# Patient Record
Sex: Male | Born: 1950 | Race: Black or African American | Hispanic: No | Marital: Married | Smoking: Never smoker
Health system: Southern US, Community
[De-identification: ages and names within clinical notes are randomized; demographics above are authoritative.]

## PROBLEM LIST (undated history)

## (undated) DIAGNOSIS — C61 Malignant neoplasm of prostate: Secondary | ICD-10-CM

## (undated) DIAGNOSIS — K922 Gastrointestinal hemorrhage, unspecified: Secondary | ICD-10-CM

## (undated) DIAGNOSIS — R569 Unspecified convulsions: Secondary | ICD-10-CM

## (undated) DIAGNOSIS — Z9989 Dependence on other enabling machines and devices: Secondary | ICD-10-CM

## (undated) DIAGNOSIS — G4733 Obstructive sleep apnea (adult) (pediatric): Secondary | ICD-10-CM

## (undated) DIAGNOSIS — Z87898 Personal history of other specified conditions: Secondary | ICD-10-CM

## (undated) DIAGNOSIS — Z973 Presence of spectacles and contact lenses: Secondary | ICD-10-CM

## (undated) DIAGNOSIS — E7800 Pure hypercholesterolemia, unspecified: Secondary | ICD-10-CM

## (undated) DIAGNOSIS — E669 Obesity, unspecified: Secondary | ICD-10-CM

## (undated) DIAGNOSIS — I10 Essential (primary) hypertension: Secondary | ICD-10-CM

## (undated) DIAGNOSIS — I2510 Atherosclerotic heart disease of native coronary artery without angina pectoris: Secondary | ICD-10-CM

## (undated) DIAGNOSIS — J189 Pneumonia, unspecified organism: Secondary | ICD-10-CM

## (undated) DIAGNOSIS — R0600 Dyspnea, unspecified: Secondary | ICD-10-CM

## (undated) DIAGNOSIS — R911 Solitary pulmonary nodule: Secondary | ICD-10-CM

## (undated) DIAGNOSIS — Z955 Presence of coronary angioplasty implant and graft: Secondary | ICD-10-CM

## (undated) DIAGNOSIS — K635 Polyp of colon: Secondary | ICD-10-CM

## (undated) DIAGNOSIS — G4730 Sleep apnea, unspecified: Secondary | ICD-10-CM

## (undated) DIAGNOSIS — N401 Enlarged prostate with lower urinary tract symptoms: Secondary | ICD-10-CM

## (undated) DIAGNOSIS — Z87820 Personal history of traumatic brain injury: Secondary | ICD-10-CM

## (undated) HISTORY — PX: COLONOSCOPY: SHX174

## (undated) HISTORY — DX: Sleep apnea, unspecified: G47.30

## (undated) HISTORY — DX: Gastrointestinal hemorrhage, unspecified: K92.2

## (undated) HISTORY — PX: TRANSTHORACIC ECHOCARDIOGRAM: SHX275

## (undated) HISTORY — PX: CARDIOVASCULAR STRESS TEST: SHX262

## (undated) HISTORY — DX: Atherosclerotic heart disease of native coronary artery without angina pectoris: I25.10

## (undated) HISTORY — DX: Unspecified convulsions: R56.9

## (undated) HISTORY — DX: Polyp of colon: K63.5

## (undated) HISTORY — DX: Essential (primary) hypertension: I10

## (undated) HISTORY — PX: CORONARY ANGIOPLASTY WITH STENT PLACEMENT: SHX49

## (undated) HISTORY — DX: Pure hypercholesterolemia, unspecified: E78.00

## (undated) HISTORY — DX: Obesity, unspecified: E66.9

---
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
## 1997-06-26 ENCOUNTER — Emergency Department (HOSPITAL_COMMUNITY): Admission: EM | Admit: 1997-06-26 | Discharge: 1997-06-26 | Payer: Self-pay | Admitting: Emergency Medicine

## 1997-07-01 ENCOUNTER — Emergency Department (HOSPITAL_COMMUNITY): Admission: EM | Admit: 1997-07-01 | Discharge: 1997-07-01 | Payer: Self-pay | Admitting: Emergency Medicine

## 2003-11-26 ENCOUNTER — Ambulatory Visit: Payer: Self-pay | Admitting: Internal Medicine

## 2003-12-04 ENCOUNTER — Ambulatory Visit: Payer: Self-pay | Admitting: Internal Medicine

## 2003-12-11 ENCOUNTER — Ambulatory Visit: Payer: Self-pay

## 2005-06-10 ENCOUNTER — Ambulatory Visit: Payer: Self-pay | Admitting: Internal Medicine

## 2005-09-20 ENCOUNTER — Ambulatory Visit: Payer: Self-pay | Admitting: Internal Medicine

## 2006-05-25 ENCOUNTER — Emergency Department (HOSPITAL_COMMUNITY): Admission: EM | Admit: 2006-05-25 | Discharge: 2006-05-25 | Payer: Self-pay | Admitting: Emergency Medicine

## 2006-05-26 ENCOUNTER — Emergency Department (HOSPITAL_COMMUNITY): Admission: EM | Admit: 2006-05-26 | Discharge: 2006-05-26 | Payer: Self-pay | Admitting: Family Medicine

## 2006-11-02 ENCOUNTER — Encounter: Payer: Self-pay | Admitting: Internal Medicine

## 2006-12-27 ENCOUNTER — Encounter: Payer: Self-pay | Admitting: Internal Medicine

## 2007-05-10 ENCOUNTER — Ambulatory Visit: Payer: Self-pay | Admitting: Internal Medicine

## 2007-05-10 DIAGNOSIS — N400 Enlarged prostate without lower urinary tract symptoms: Secondary | ICD-10-CM | POA: Insufficient documentation

## 2007-05-10 DIAGNOSIS — I10 Essential (primary) hypertension: Secondary | ICD-10-CM | POA: Insufficient documentation

## 2007-05-10 DIAGNOSIS — R569 Unspecified convulsions: Secondary | ICD-10-CM | POA: Insufficient documentation

## 2007-05-10 DIAGNOSIS — E785 Hyperlipidemia, unspecified: Secondary | ICD-10-CM | POA: Insufficient documentation

## 2007-05-10 DIAGNOSIS — R3915 Urgency of urination: Secondary | ICD-10-CM | POA: Insufficient documentation

## 2007-05-10 LAB — CONVERTED CEMR LAB
Bilirubin Urine: NEGATIVE
Blood in Urine, dipstick: NEGATIVE
Glucose, Urine, Semiquant: NEGATIVE
Ketones, urine, test strip: NEGATIVE
Nitrite: NEGATIVE
Protein, U semiquant: NEGATIVE
Specific Gravity, Urine: <1.005
Urobilinogen, UA: NEGATIVE
WBC Urine, dipstick: NEGATIVE
pH: 6.5

## 2007-08-21 ENCOUNTER — Ambulatory Visit: Payer: Self-pay | Admitting: Internal Medicine

## 2007-08-21 LAB — CONVERTED CEMR LAB
ALT: 30 units/L (ref 0–53)
AST: 28 units/L (ref 0–37)
Albumin: 4.1 g/dL (ref 3.5–5.2)
Alkaline Phosphatase: 53 units/L (ref 39–117)
BUN: 13 mg/dL (ref 6–23)
Basophils Absolute: 0 10*3/uL (ref 0.0–0.1)
Basophils Relative: 0.4 % (ref 0.0–3.0)
Bilirubin Urine: NEGATIVE
Bilirubin, Direct: 0.1 mg/dL (ref 0.0–0.3)
Blood in Urine, dipstick: NEGATIVE
CO2: 28 meq/L (ref 19–32)
Calcium: 9.3 mg/dL (ref 8.4–10.5)
Chloride: 102 meq/L (ref 96–112)
Cholesterol: 222 mg/dL (ref 0–200)
Creatinine, Ser: 1.2 mg/dL (ref 0.4–1.5)
Direct LDL: 179.1 mg/dL
Eosinophils Absolute: 0.1 10*3/uL (ref 0.0–0.7)
Eosinophils Relative: 1.4 % (ref 0.0–5.0)
GFR calc Af Amer: 81 mL/min
GFR calc non Af Amer: 67 mL/min
Glucose, Bld: 106 mg/dL — ABNORMAL HIGH (ref 70–99)
Glucose, Urine, Semiquant: NEGATIVE
HCT: 46.4 % (ref 39.0–52.0)
HDL: 36.1 mg/dL — ABNORMAL LOW (ref >39.0)
Hemoglobin: 15.7 g/dL (ref 13.0–17.0)
Ketones, urine, test strip: NEGATIVE
Lymphocytes Relative: 30.6 % (ref 12.0–46.0)
MCHC: 33.7 g/dL (ref 30.0–36.0)
MCV: 88.6 fL (ref 78.0–100.0)
Monocytes Absolute: 0.6 10*3/uL (ref 0.1–1.0)
Monocytes Relative: 10.6 % (ref 3.0–12.0)
Neutro Abs: 3 10*3/uL (ref 1.4–7.7)
Neutrophils Relative %: 57 % (ref 43.0–77.0)
Nitrite: NEGATIVE
PSA: 2.81 ng/mL (ref 0.10–4.00)
Platelets: 228 10*3/uL (ref 150–400)
Potassium: 3.8 meq/L (ref 3.5–5.1)
RBC: 5.24 M/uL (ref 4.22–5.81)
RDW: 13.5 % (ref 11.5–14.6)
Sodium: 139 meq/L (ref 135–145)
Specific Gravity, Urine: 1.025
TSH: 0.52 microintl units/mL (ref 0.35–5.50)
Total Bilirubin: 0.9 mg/dL (ref 0.3–1.2)
Total CHOL/HDL Ratio: 6.1
Total Protein: 7.9 g/dL (ref 6.0–8.3)
Triglycerides: 94 mg/dL (ref 0–149)
Urobilinogen, UA: 0.2
VLDL: 19 mg/dL (ref 0–40)
WBC Urine, dipstick: NEGATIVE
WBC: 5.3 10*3/uL (ref 4.5–10.5)
pH: 6

## 2007-08-22 LAB — CONVERTED CEMR LAB

## 2007-08-28 ENCOUNTER — Ambulatory Visit: Payer: Self-pay | Admitting: Internal Medicine

## 2007-09-11 ENCOUNTER — Ambulatory Visit: Payer: Self-pay | Admitting: Internal Medicine

## 2007-09-11 DIAGNOSIS — L678 Other hair color and hair shaft abnormalities: Secondary | ICD-10-CM | POA: Insufficient documentation

## 2007-09-11 DIAGNOSIS — L738 Other specified follicular disorders: Secondary | ICD-10-CM

## 2007-11-16 ENCOUNTER — Telehealth: Payer: Self-pay | Admitting: Internal Medicine

## 2008-02-14 ENCOUNTER — Ambulatory Visit: Payer: Self-pay | Admitting: Internal Medicine

## 2008-06-13 ENCOUNTER — Ambulatory Visit: Payer: Self-pay | Admitting: Internal Medicine

## 2008-10-07 ENCOUNTER — Ambulatory Visit: Payer: Self-pay | Admitting: Internal Medicine

## 2008-10-07 LAB — CONVERTED CEMR LAB
ALT: 24 units/L (ref 0–53)
AST: 25 units/L (ref 0–37)
Albumin: 4.2 g/dL (ref 3.5–5.2)
Alkaline Phosphatase: 70 units/L (ref 39–117)
BUN: 11 mg/dL (ref 6–23)
Basophils Absolute: 0 10*3/uL (ref 0.0–0.1)
Basophils Relative: 0.5 % (ref 0.0–3.0)
Bilirubin Urine: NEGATIVE
Bilirubin, Direct: 0.1 mg/dL (ref 0.0–0.3)
Blood in Urine, dipstick: NEGATIVE
CO2: 27 meq/L (ref 19–32)
Calcium: 9.4 mg/dL (ref 8.4–10.5)
Chloride: 107 meq/L (ref 96–112)
Cholesterol: 187 mg/dL (ref 0–200)
Creatinine, Ser: 1.2 mg/dL (ref 0.4–1.5)
Eosinophils Absolute: 0.1 10*3/uL (ref 0.0–0.7)
Eosinophils Relative: 1.1 % (ref 0.0–5.0)
GFR calc non Af Amer: 79.94 mL/min (ref >60)
Glucose, Bld: 98 mg/dL (ref 70–99)
Glucose, Urine, Semiquant: NEGATIVE
HCT: 48 % (ref 39.0–52.0)
HDL: 38.1 mg/dL — ABNORMAL LOW (ref >39.00)
Hemoglobin: 15.6 g/dL (ref 13.0–17.0)
Ketones, urine, test strip: NEGATIVE
LDL Cholesterol: 138 mg/dL — ABNORMAL HIGH (ref 0–99)
Lymphocytes Relative: 28 % (ref 12.0–46.0)
Lymphs Abs: 1.5 10*3/uL (ref 0.7–4.0)
MCHC: 32.5 g/dL (ref 30.0–36.0)
MCV: 89.3 fL (ref 78.0–100.0)
Monocytes Absolute: 0.4 10*3/uL (ref 0.1–1.0)
Monocytes Relative: 7 % (ref 3.0–12.0)
Neutro Abs: 3.3 10*3/uL (ref 1.4–7.7)
Neutrophils Relative %: 63.4 % (ref 43.0–77.0)
Nitrite: NEGATIVE
PSA: 2.4 ng/mL (ref 0.10–4.00)
Platelets: 210 10*3/uL (ref 150.0–400.0)
Potassium: 3.4 meq/L — ABNORMAL LOW (ref 3.5–5.1)
Protein, U semiquant: NEGATIVE
RBC: 5.37 M/uL (ref 4.22–5.81)
RDW: 13.2 % (ref 11.5–14.6)
Sodium: 139 meq/L (ref 135–145)
Specific Gravity, Urine: 1.02
TSH: 0.91 microintl units/mL (ref 0.35–5.50)
Total Bilirubin: 0.5 mg/dL (ref 0.3–1.2)
Total CHOL/HDL Ratio: 5
Total Protein: 8.5 g/dL — ABNORMAL HIGH (ref 6.0–8.3)
Triglycerides: 56 mg/dL (ref 0.0–149.0)
Urobilinogen, UA: 0.2
VLDL: 11.2 mg/dL (ref 0.0–40.0)
WBC Urine, dipstick: NEGATIVE
WBC: 5.3 10*3/uL (ref 4.5–10.5)
pH: 5.5

## 2008-10-14 ENCOUNTER — Ambulatory Visit: Payer: Self-pay | Admitting: Internal Medicine

## 2008-10-27 ENCOUNTER — Encounter: Payer: Self-pay | Admitting: Internal Medicine

## 2009-01-28 ENCOUNTER — Telehealth: Payer: Self-pay | Admitting: Internal Medicine

## 2009-02-02 ENCOUNTER — Encounter: Payer: Self-pay | Admitting: Internal Medicine

## 2009-02-24 ENCOUNTER — Ambulatory Visit: Payer: Self-pay | Admitting: Internal Medicine

## 2009-03-23 ENCOUNTER — Ambulatory Visit: Payer: Self-pay | Admitting: Internal Medicine

## 2009-03-23 DIAGNOSIS — J029 Acute pharyngitis, unspecified: Secondary | ICD-10-CM | POA: Insufficient documentation

## 2009-03-23 DIAGNOSIS — J069 Acute upper respiratory infection, unspecified: Secondary | ICD-10-CM | POA: Insufficient documentation

## 2009-04-02 ENCOUNTER — Telehealth: Payer: Self-pay | Admitting: Internal Medicine

## 2009-05-22 ENCOUNTER — Ambulatory Visit: Payer: Self-pay | Admitting: Internal Medicine

## 2009-05-22 ENCOUNTER — Encounter (INDEPENDENT_AMBULATORY_CARE_PROVIDER_SITE_OTHER): Payer: Self-pay | Admitting: *Deleted

## 2009-05-22 DIAGNOSIS — I209 Angina pectoris, unspecified: Secondary | ICD-10-CM | POA: Insufficient documentation

## 2009-05-22 DIAGNOSIS — R079 Chest pain, unspecified: Secondary | ICD-10-CM | POA: Insufficient documentation

## 2009-05-22 LAB — CONVERTED CEMR LAB
BUN: 9 mg/dL (ref 6–23)
Basophils Absolute: 0 10*3/uL (ref 0.0–0.1)
Basophils Relative: 0 % (ref 0–1)
CO2: 29 meq/L (ref 19–32)
Calcium: 9.5 mg/dL (ref 8.4–10.5)
Chloride: 105 meq/L (ref 96–112)
Creatinine, Ser: 1.08 mg/dL (ref 0.40–1.50)
Eosinophils Absolute: 0.2 10*3/uL (ref 0.0–0.7)
Eosinophils Relative: 2 % (ref 0–5)
Glucose, Bld: 112 mg/dL — ABNORMAL HIGH (ref 70–99)
HCT: 42.4 % (ref 39.0–52.0)
Hemoglobin: 14.4 g/dL (ref 13.0–17.0)
INR: 0.91 (ref <1.50)
Lymphocytes Relative: 32 % (ref 12–46)
Lymphs Abs: 2 10*3/uL (ref 0.7–4.0)
MCHC: 33.9 g/dL (ref 30.0–36.0)
MCV: 87.8 fL (ref 78.0–100.0)
Monocytes Absolute: 0.6 10*3/uL (ref 0.1–1.0)
Monocytes Relative: 10 % (ref 3–12)
Neutro Abs: 3.5 10*3/uL (ref 1.7–7.7)
Neutrophils Relative %: 56 % (ref 43–77)
Platelets: 211 10*3/uL (ref 150–400)
Potassium: 4.1 meq/L (ref 3.5–5.3)
Prothrombin Time: 12.2 s (ref 11.6–15.2)
RBC: 4.83 M/uL (ref 4.22–5.81)
RDW: 14.9 % (ref 11.5–15.5)
Sodium: 140 meq/L (ref 135–145)
WBC: 6.3 10*3/uL (ref 4.0–10.5)
aPTT: 26 s (ref 24–37)

## 2009-05-25 ENCOUNTER — Inpatient Hospital Stay (HOSPITAL_BASED_OUTPATIENT_CLINIC_OR_DEPARTMENT_OTHER): Admission: RE | Admit: 2009-05-25 | Discharge: 2009-05-25 | Payer: Self-pay | Admitting: Cardiology

## 2009-05-25 ENCOUNTER — Ambulatory Visit: Payer: Self-pay | Admitting: Cardiology

## 2009-05-25 ENCOUNTER — Observation Stay (HOSPITAL_COMMUNITY): Admission: AD | Admit: 2009-05-25 | Discharge: 2009-05-26 | Payer: Self-pay | Admitting: Cardiology

## 2009-05-25 DIAGNOSIS — Z955 Presence of coronary angioplasty implant and graft: Secondary | ICD-10-CM | POA: Insufficient documentation

## 2009-05-25 HISTORY — DX: Presence of coronary angioplasty implant and graft: Z95.5

## 2009-06-04 ENCOUNTER — Encounter (HOSPITAL_COMMUNITY)
Admission: RE | Admit: 2009-06-04 | Discharge: 2009-09-02 | Payer: Self-pay | Source: Home / Self Care | Admitting: Cardiology

## 2009-06-08 ENCOUNTER — Encounter: Payer: Self-pay | Admitting: Internal Medicine

## 2009-06-09 ENCOUNTER — Encounter: Payer: Self-pay | Admitting: Internal Medicine

## 2009-06-10 ENCOUNTER — Encounter: Payer: Self-pay | Admitting: Internal Medicine

## 2009-06-10 ENCOUNTER — Ambulatory Visit: Payer: Self-pay | Admitting: Internal Medicine

## 2009-06-10 DIAGNOSIS — I25110 Atherosclerotic heart disease of native coronary artery with unstable angina pectoris: Secondary | ICD-10-CM | POA: Insufficient documentation

## 2009-06-10 DIAGNOSIS — I25119 Atherosclerotic heart disease of native coronary artery with unspecified angina pectoris: Secondary | ICD-10-CM | POA: Insufficient documentation

## 2009-06-10 DIAGNOSIS — R109 Unspecified abdominal pain: Secondary | ICD-10-CM | POA: Insufficient documentation

## 2009-06-11 ENCOUNTER — Ambulatory Visit: Payer: Self-pay | Admitting: Internal Medicine

## 2009-06-11 DIAGNOSIS — G4730 Sleep apnea, unspecified: Secondary | ICD-10-CM | POA: Insufficient documentation

## 2009-06-16 ENCOUNTER — Encounter: Payer: Self-pay | Admitting: Internal Medicine

## 2009-06-25 ENCOUNTER — Encounter: Payer: Self-pay | Admitting: Internal Medicine

## 2009-06-25 ENCOUNTER — Ambulatory Visit (HOSPITAL_BASED_OUTPATIENT_CLINIC_OR_DEPARTMENT_OTHER)
Admission: RE | Admit: 2009-06-25 | Discharge: 2009-06-25 | Payer: Self-pay | Source: Home / Self Care | Admitting: Internal Medicine

## 2009-07-11 ENCOUNTER — Ambulatory Visit: Payer: Self-pay | Admitting: Pulmonary Disease

## 2009-07-20 ENCOUNTER — Encounter: Payer: Self-pay | Admitting: Internal Medicine

## 2009-07-21 ENCOUNTER — Telehealth: Payer: Self-pay | Admitting: Internal Medicine

## 2009-07-22 ENCOUNTER — Ambulatory Visit: Payer: Self-pay | Admitting: Internal Medicine

## 2009-07-24 ENCOUNTER — Encounter: Payer: Self-pay | Admitting: Internal Medicine

## 2009-08-10 ENCOUNTER — Telehealth: Payer: Self-pay | Admitting: Internal Medicine

## 2009-09-03 ENCOUNTER — Encounter (HOSPITAL_COMMUNITY): Admission: RE | Admit: 2009-09-03 | Discharge: 2009-09-11 | Payer: Self-pay | Admitting: Cardiology

## 2009-09-22 ENCOUNTER — Ambulatory Visit: Payer: Self-pay | Admitting: Pulmonary Disease

## 2009-09-22 DIAGNOSIS — G4733 Obstructive sleep apnea (adult) (pediatric): Secondary | ICD-10-CM | POA: Insufficient documentation

## 2009-09-24 ENCOUNTER — Encounter: Payer: Self-pay | Admitting: Internal Medicine

## 2009-10-06 ENCOUNTER — Telehealth: Payer: Self-pay | Admitting: Pulmonary Disease

## 2009-10-27 ENCOUNTER — Encounter: Payer: Self-pay | Admitting: Internal Medicine

## 2009-10-27 ENCOUNTER — Ambulatory Visit: Payer: Self-pay | Admitting: Internal Medicine

## 2009-11-09 ENCOUNTER — Encounter: Payer: Self-pay | Admitting: Pulmonary Disease

## 2009-11-09 ENCOUNTER — Ambulatory Visit (HOSPITAL_BASED_OUTPATIENT_CLINIC_OR_DEPARTMENT_OTHER)
Admission: RE | Admit: 2009-11-09 | Discharge: 2009-11-09 | Payer: Self-pay | Source: Home / Self Care | Admitting: Pulmonary Disease

## 2009-11-25 ENCOUNTER — Ambulatory Visit: Payer: Self-pay | Admitting: Pulmonary Disease

## 2009-11-26 ENCOUNTER — Encounter: Payer: Self-pay | Admitting: Pulmonary Disease

## 2009-12-07 ENCOUNTER — Ambulatory Visit: Payer: Self-pay | Admitting: Pulmonary Disease

## 2010-01-14 ENCOUNTER — Ambulatory Visit
Admission: RE | Admit: 2010-01-14 | Discharge: 2010-01-14 | Payer: Self-pay | Source: Home / Self Care | Attending: Internal Medicine | Admitting: Internal Medicine

## 2010-01-14 ENCOUNTER — Encounter: Payer: Self-pay | Admitting: Internal Medicine

## 2010-02-02 NOTE — Cardiovascular Report (Signed)
Summary: Pre Cath Orders   Pre Cath Orders   Imported By: Roderic Ovens 06/04/2009 11:27:02  _____________________________________________________________________  External Attachment:    Type:   Image     Comment:   External Document

## 2010-02-02 NOTE — Consult Note (Signed)
Summary: [facility]   Imported By: Maryln Gottron 10/31/2008 14:29:04  _____________________________________________________________________  External Attachment:    Type:   Image     Comment:   External Document
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
## 2010-02-02 NOTE — Progress Notes (Signed)
Summary: new rxs needed  Phone Note Refill Request Call back at Home Phone [phone] Message from:  Patient  Refills Requested: Medication #1:  SIMVASTATIN 80 MG TABS one daily  Medication #2:  AMLODIPINE BESYLATE 5 MG TABS one daily  Medication #3:  BENICAR HCT 40-25 MG TABS one daily. Pt needs new rxs called in to [facility] on [address]. pt stated that he was here in oct 2010. He lost scripts. And the ones dated for 01-01-2009- he said that it was not rxs, they were just a med list.  Initial call taken by: Warnell Forester,  January 28, 2009 4:57 PM    Prescriptions: BENICAR HCT 40-25 MG TABS (OLMESARTAN MEDOXOMIL-HCTZ) one daily  #90 x 4   Entered by:   Raechel Ache, RN   Authorized by:   Gordy Savers  MD   Signed by:   Raechel Ache, RN on 01/29/2009   Method used:   Electronically to        [facility] (retail)       [address]       [place], [place]  [id number]       Ph: [phone]       Fax: [phone]   RxID:   [id number] SIMVASTATIN 80 MG TABS (SIMVASTATIN) one daily  #90 x 4   Entered by:   Raechel Ache, RN   Authorized by:   Gordy Savers  MD   Signed by:   Raechel Ache, RN on 01/29/2009   Method used:   Electronically to        [facility] (retail)       [address]       [place], [place]  [address]       Ph: [phone]       Fax: [phone]   RxID:   [id number] AMLODIPINE BESYLATE 5 MG TABS (AMLODIPINE BESYLATE) one daily  #90 x 4   Entered by:   Raechel Ache, RN   Authorized by:   Gordy Savers  MD   Signed by:   Raechel Ache, RN on 01/29/2009   Method used:   Electronically to        [facility] (retail)       [address]       [place], [place]  [address]       Ph: [phone]       Fax: [phone]   RxID:   [id number]  #90 each RF 4
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
## 2010-02-02 NOTE — Progress Notes (Signed)
Summary: cpap issues  Phone Note Call from Patient Call back at Home Phone [phone]   Caller: Patient Call For: clance Summary of Call: Pt states he started using his cpap about a wk ago and he started experiencing chest pains, he discontinued for a couple and that subsided some, but not completely, pls advise.//[facility] Initial call taken by: Darletta Moll,  October 06, 2009 10:14 AM  Follow-up for Phone Call        Spoke with patient-Chest discomfort started Sunday a week ago-discomfort went away when not using CPAP. Pt states that the pressure doesnt feel too high-Uses [facility]. Please advise.Reynaldo Minium CMA  October 06, 2009 10:23 AM   Additional Follow-up for Phone Call Additional follow up Details #1::        the pressure is low...this should not be the issue.  I wonder if he is air gulping...found out if he is swallowing a lot of air and having burping and gas? Additional Follow-up by: Barbaraann Share MD,  October 08, 2009 4:41 PM    Additional Follow-up for Phone Call Additional follow up Details #2::    Spoke with pt.  He denies any gas/belching and states that he does not think he is gulping air. Pls advise thanks! Follow-up by: Vernie Murders,  October 08, 2009 4:46 PM  Additional Follow-up for Phone Call Additional follow up Details #3:: Details for Additional Follow-up Action Taken: I really do not know why he would have this issue at such low pressure.  will try to change pressure delivery.  will have dme put machine on auto mode to see if that is more comfortable.  will send an order  Spoke with pt and notified of the above.  Pt verbalized understanding Vernie Murders  October 08, 2009 5:09 PM  Additional Follow-up by: Barbaraann Share MD,  October 08, 2009 5:02 PM

## 2010-02-02 NOTE — Progress Notes (Signed)
Summary: rx called in   Phone Note Call from Patient Call back at Home Phone [phone]   Caller: pt live Call For: K  Summary of Call: He lost the rx for Simvustatin 40mg  take 1 per day.  He needs that rx called in to [facility]  Initial call taken by: Roselle Locus,  November 16, 2007 8:14 AM  Follow-up for Phone Call        Rx Called In Follow-up by: Raechel Ache, RN,  November 16, 2007 8:51 AM      Prescriptions: SIMVASTATIN 40 MG TABS (SIMVASTATIN) one daily  #90 x 6   Entered by:   Raechel Ache, RN   Authorized by:   Gordy Savers  MD   Signed by:   Raechel Ache, RN on 11/16/2007   Method used:   Electronically to        [id number] (retail)       [address]       [place], [place]  [address]       Ph: [phone] or [phone]       Fax: [phone]   RxID:   [id number]
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
## 2010-02-02 NOTE — Miscellaneous (Signed)
Summary: [facility] Progress Note   [facility] Progress Note   Imported By: Roderic Ovens 07/30/2009 15:52:19  _____________________________________________________________________  External Attachment:    Type:   Image     Comment:   External Document

## 2010-02-02 NOTE — Miscellaneous (Signed)
Summary: Labs requested by [facility]  Labs requested by [facility]   Imported By: Maryln Gottron 06/15/2009 09:37:34  _____________________________________________________________________  External Attachment:    Type:   Image     Comment:   External Document

## 2010-02-02 NOTE — Assessment & Plan Note (Signed)
Summary: rov to review titration study.   Visit Type:  Follow-up Copy to:  Dr Dietrich Pates Primary Provider/Referring Provider:  Gordy Savers  MD  CC:  pt here to discuss sleep study titration  results.  History of Present Illness: the pt comes in today for f/u of his osa.  He has had a recent titration study, and found to have excellent control of his events on 13cm of pressure while sleeping on his side.  When he changed to supine positiion, he began to have increased apnea with pressure increased as high as 25cm.  However, it was noted that he was pulling at his mask on the higher pressures, and I suspect he was having a high leak.  I have reviewed the study in detail with him, and answered all of his questions.  Current Medications (verified): 1)  Simvastatin 40 Mg Tabs (Simvastatin) .... Qd 2)  Plavix 75 Mg Tabs (Clopidogrel Bisulfate) .... Qd 3)  Aspirin 325 Mg Tabs (Aspirin) .... One Daily 4)  Micardis 40 Mg Tabs (Telmisartan) .... One Daily 5)  Amlodipine Besylate 5 Mg Tabs (Amlodipine Besylate) .... One Daily  Allergies (verified): No Known Drug Allergies  Review of Systems       The patient complains of chest pain and weight change.  The patient denies shortness of breath with activity, shortness of breath at rest, productive cough, non-productive cough, coughing up blood, irregular heartbeats, acid heartburn, indigestion, loss of appetite, abdominal pain, difficulty swallowing, sore throat, tooth/dental problems, headaches, nasal congestion/difficulty breathing through nose, sneezing, itching, ear ache, anxiety, depression, hand/feet swelling, rash, change in color of mucus, and fever.    Vital Signs:  Patient profile:   60 year old male Height:      66 inches Weight:      187 pounds BMI:     30.29 O2 Sat:      98 % on Room air Temp:     97.9 degrees F oral Pulse rate:   79 / minute BP sitting:   122 / 80  (left arm) Cuff size:   regular  Vitals Entered By: Carver Fila (December 07, 2009 12:02 PM)  O2 Flow:  Room air CC: pt here to discuss sleep study titration  results Comments meds and allergies updated Phone number updated Carver Fila  December 07, 2009 12:02 PM    Physical Exam  General:  ow male in nad Nose:  no skin breakdown or pressure necrosis from cpap mask. Extremities:  no significant edema, no cyanosis  Neurologic:  alert and oriented , moves all 4.   Impression & Recommendations:  Problem # 1:  OBSTRUCTIVE SLEEP APNEA (ICD-327.23) the pt has osa that will be optimally treated with cpap.  He is clearly worse during supine sleep.  He appeared to do well on 13cm while sleeping on his side, and I think this is a reasonable pressure to start with along with positional therapy.  I really think his high pressure needs noted on titration study was due to mask leaks (creating a viscious cycle).  He is currently on an auto device, and will get his download from advanced.  He will be able to compare what mode works best for him (auto vs set pressure at 13cm).  If he is doing well, will see back in 6mos.  However, he is to call if having issues, or if he feels the auto mode was more comfortable.  Other Orders: Est. Patient Level III (875[address])  DME Referral (DME)  Patient Instructions: 1)  will have your cpap machine set on 13cm of water pressure.  Try and stay off your back as much as possible while sleeping. 2)  compare the "auto setting" to the set pressure of 13cm, and see what works best for you 3)  work on weight loss 4)  if doing well, will see you back in 6mos.

## 2010-02-02 NOTE — Letter (Signed)
Summary: Walk In Patients Form  Walk In Patients Form   Imported By: Roderic Ovens 06/24/2009 10:10:16  _____________________________________________________________________  External Attachment:    Type:   Image     Comment:   External Document
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
## 2010-02-02 NOTE — Miscellaneous (Signed)
  Clinical Lists Changes  Medications: Changed medication from BENAZEPRIL-HYDROCHLOROTHIAZIDE 20-12.5 MG  TABS (BENAZEPRIL-HYDROCHLOROTHIAZIDE) 1 once daily to BENAZEPRIL-HYDROCHLOROTHIAZIDE 20-12.5 MG  TABS (BENAZEPRIL-HYDROCHLOROTHIAZIDE) 2 once daily

## 2010-02-02 NOTE — Assessment & Plan Note (Signed)
Summary: 3 MONTH ROV/NJR reschedule with patient/[facility] PER DOC/NJR   Vital Signs:  Patient Profile:   60 Years Old Male Weight:      196 pounds Temp:     98 degrees F oral BP sitting:   130 / 96  (left arm) Cuff size:   regular  Vitals Entered By: Raechel Ache, RN (February 14, 2008 11:22 AM)                 Chief Complaint:  ROV.Manuel Barker  History of Present Illness:  60 year old gentleman, history of treated hypertension, who is seen today for follow-up.  He has dyslipidemia, controlled on simvastatin, and Niaspan.  He does track blood pressures sporadically, and did have a normal blood pressure yesterday.  His last two office visits revealed nicely controlled.  Blood pressure readings    Current Allergies: No known allergies       Physical Exam  General:     Well-developed,well-nourished,in no acute distress; alert,appropriate and cooperative throughout examination';130/96 Head:     Normocephalic and atraumatic without obvious abnormalities. No apparent alopecia or balding. Eyes:     No corneal or conjunctival inflammation noted. EOMI. Perrla. Funduscopic exam benign, without hemorrhages, exudates or papilledema. Vision grossly normal. Mouth:     Oral mucosa and oropharynx without lesions or exudates.  Teeth in good repair. Neck:     No deformities, masses, or tenderness noted. Lungs:     Normal respiratory effort, chest expands symmetrically. Lungs are clear to auscultation, no crackles or wheezes. Heart:     Normal rate and regular rhythm. S1 and S2 normal without gallop, murmur, click, rub or other extra sounds. Abdomen:     Bowel sounds positive,abdomen soft and non-tender without masses, organomegaly or hernias noted. Msk:     No deformity or scoliosis noted of thoracic or lumbar spine.   Pulses:     R and L carotid,radial,femoral,dorsalis pedis and posterior tibial pulses are full and equal bilaterally    Impression & Recommendations:  Problem # 1:   HYPERTENSION (ICD-401.9)  His updated medication list for this problem includes:    Benazepril-hydrochlorothiazide 20-12.5 Mg Tabs (Benazepril-hydrochlorothiazide) .Manuel Barker... 2 once daily   Problem # 2:  HYPERLIPIDEMIA (ICD-272.4)  His updated medication list for this problem includes:    Niaspan 500 Mg Tbcr (Niacin (antihyperlipidemic)) .Manuel Barker... 1 once daily    Simvastatin 40 Mg Tabs (Simvastatin) ..... One daily   Complete Medication List: 1)  Niaspan 500 Mg Tbcr (Niacin (antihyperlipidemic)) .Manuel Barker.. 1 once daily 2)  Benazepril-hydrochlorothiazide 20-12.5 Mg Tabs (Benazepril-hydrochlorothiazide) .... 2 once daily 3)  Bufferin Low Dose 81 Mg Tbec (Aspirin) .Manuel Barker.. 1 once daily 4)  Simvastatin 40 Mg Tabs (Simvastatin) .... One daily 5)  Doxycycline Hyclate 100 Mg Tabs (Doxycycline hyclate) .... One twice daily   Patient Instructions: 1)  Limit your Sodium (Salt). 2)  It is important that you exercise regularly at least 20 minutes 5 times a week. If you develop chest pain, have severe difficulty breathing, or feel very tired , stop exercising immediately and seek medical attention. 3)  You need to lose weight. Consider a lower calorie diet and regular exercise.  4)  Check your Blood Pressure regularly. If it is above: 140/90 you should make an appointment. 5)  Please schedule a follow-up appointment in 4 months.   Prescriptions: SIMVASTATIN 40 MG TABS (SIMVASTATIN) one daily  #90 x 6   Entered and Authorized by:   Gordy Savers  MD   Signed  by:   Gordy Savers  MD on 02/14/2008   Method used:   Print then Give to Patient   RxID:   [id number] BENAZEPRIL-HYDROCHLOROTHIAZIDE 20-12.5 MG  TABS (BENAZEPRIL-HYDROCHLOROTHIAZIDE) 2 once daily  #90 x 5   Entered and Authorized by:   Gordy Savers  MD   Signed by:   Gordy Savers  MD on 02/14/2008   Method used:   Print then Give to Patient   RxID:   [id number] NIASPAN 500 MG TBCR (NIACIN (ANTIHYPERLIPIDEMIC)) 1 once  daily  #90 x 6   Entered and Authorized by:   Gordy Savers  MD   Signed by:   Gordy Savers  MD on 02/14/2008   Method used:   Print then Give to Patient   RxID:   [id number]
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
## 2010-02-02 NOTE — Progress Notes (Signed)
Summary: sore throat continues  Phone Note Call from Patient Call back at Home Phone [phone]   Caller: vm Summary of Call: Sore throat continues, hurts when he swallows, doesn't hurt any other time.   took the 2 pills Dr. Kirtland Bouchard gave him 3-21.  Going on 2 1/2 weeks, started 3-15 with chills, sore throat.    What to do?   Initial call taken by: Rudy Jew, RN,  April 02, 2009 11:07 AM  Follow-up for Phone Call        diclofenac 75  #14 one two times a day  Follow-up by: Gordy Savers  MD,  April 02, 2009 11:16 AM  Additional Follow-up for Phone Call Additional follow up Details #1::        Phone Call Completed Additional Follow-up by: Rudy Jew, RN,  April 02, 2009 12:04 PM    New/Updated Medications: DICLOFENAC SODIUM 75 MG TBEC (DICLOFENAC SODIUM) One bid Prescriptions: DICLOFENAC SODIUM 75 MG TBEC (DICLOFENAC SODIUM) One bid  #14 x 0   Entered by:   Rudy Jew, RN   Authorized by:   Gordy Savers  MD   Signed by:   Rudy Jew, RN on 04/02/2009   Method used:   Electronically to        [facility] (retail)       [address]       [place], [place]  [address]       Ph: [phone]       Fax: [phone]   RxID:   [id number]
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
## 2010-02-02 NOTE — Miscellaneous (Signed)
Summary: change from Benicar to Micardis  Clinical Lists Changes  Medications: Removed medication of BENICAR HCT 40-25 MG TABS (OLMESARTAN MEDOXOMIL-HCTZ) one daily Added new medication of MICARDIS HCT 40-12.5 MG TABS (TELMISARTAN-HCTZ) 1 once daily - Signed Rx of MICARDIS HCT 40-12.5 MG TABS (TELMISARTAN-HCTZ) 1 once daily;  #90 x 3;  Signed;  Entered by: Raechel Ache, RN;  Authorized by: Gordy Savers  MD;  Method used: Electronically to [facility] [address], [place], [place]  [id number], Ph: [phone], Fax: [phone]    Prescriptions: MICARDIS HCT 40-12.5 MG TABS (TELMISARTAN-HCTZ) 1 once daily  #90 x 3   Entered by:   Raechel Ache, RN   Authorized by:   Gordy Savers  MD   Signed by:   Raechel Ache, RN on 02/02/2009   Method used:   Electronically to        [facility] (retail)       [address]       [place], [place]  [address]       Ph: [phone]       Fax: [phone]   RxID:   [id number]
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
## 2010-02-02 NOTE — Letter (Signed)
Summary: [facility] - Event Report  [facility] - Event Report   Imported By: Debby Freiberg 06/30/2009 11:38:15  _____________________________________________________________________  External Attachment:    Type:   Image     Comment:   External Document
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
## 2010-02-02 NOTE — Letter (Signed)
[facility]: [facility]  [facility]   Imported By: Marylou Mccoy 10/16/2009 10:29:20  _____________________________________________________________________  External Attachment:    Type:   Image     Comment:   External Document

## 2010-02-02 NOTE — Miscellaneous (Signed)
  Clinical Lists Changes  Orders: Added new Referral order of Pulmonary Referral (Pulmonary) - Signed 

## 2010-02-02 NOTE — Letter (Signed)
[facility]: [facility]  [facility]   Imported By: Lester Marbury 12/04/2009 10:05:25  _____________________________________________________________________  External Attachment:    Type:   Image     Comment:   External Document

## 2010-02-02 NOTE — Letter (Signed)
Summary: Cardiac Catheterization Instructions- [facility]  [facility], Main Office  [address]   [place], [place] [address]   Phone: [phone]  Fax: [phone]     05/22/2009 MRN: [id number]  Manuel Barker [address] [place], [place]  [address]  Dear [name],   You are scheduled for a Cardiac Catheterization on 5/23/2011_Monday________ with Dr._McLean_____________  Please arrive to the 1st floor of the [facility] at [facility] at __8___ am  on the day of your procedure. Please do not arrive before 6:30 a.m. Call the Heart and Vascular Center at [phone] if you are unable to make your appointmnet. The Code to get into the parking garage under the building is 0010________. Take the elevators to the 1st floor. You must have someone to drive you home. Someone must be with you for the first 24 hours after you arrive home. Please wear clothes that are easy to get on and off and wear slip-on shoes. Do not eat or drink after midnight except water with your medications that morning. Bring all your medications and current insurance cards with you.   ________________________________________________________________   ___ [name] may take ALL of your medications with water that morning. ________________________________________________________________________________________________________________________________   _ _______________________________________________________________________________________________________________________  The usual length of stay after your procedure is 2 to 3 hours. This can vary.  If you have any questions, please call the office at the number listed above.   Layne Benton, RN, BSN
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
## 2010-02-02 NOTE — Progress Notes (Signed)
Summary: results from sleep study  Phone Note Call from Patient Call back at Home Phone [phone]   Caller: Patient Summary of Call: Results from sleep study Initial call taken by: Judie Grieve,  July 21, 2009 12:48 PM  Follow-up for Phone Call        Sleep study results not in EMR and have not been reviewed by Dr. Tenny Craw. Pt given this information and told that Annice Pih (Dr. Tenny Craw' nurse) would be back in office on Thursday and that Dr. Tenny Craw would be back in office on Friday and Annice Pih would call pt after test reveiwed by Dr. Jacquelin Hawking, RN, BSN  July 21, 2009 1:17 PM   Additional Follow-up for Phone Call Additional follow up Details #1::        Have not seen results  Additional Follow-up by: Sherrill Raring, MD, [facility],  July 21, 2009 2:36 PM     Appended Document: results from sleep study Called patient with sleep study results. Dr. Tenny Craw would like patient to see Dr.Clance in consult. Will make referral ( patient is aware).

## 2010-02-02 NOTE — Miscellaneous (Signed)
Summary: [facility] Notification of Patient Event   [facility] Notification of Patient Event   Imported By: Roderic Ovens 07/22/2009 12:05:39  _____________________________________________________________________  External Attachment:    Type:   Image     Comment:   External Document
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
## 2010-03-22 LAB — CBC
HCT: 41.8 % (ref 39.0–52.0)
Hemoglobin: 14.3 g/dL (ref 13.0–17.0)
MCHC: 34.1 g/dL (ref 30.0–36.0)
MCV: 87.8 fL (ref 78.0–100.0)
Platelets: 205 10*3/uL (ref 150–400)
RBC: 4.77 MIL/uL (ref 4.22–5.81)
RDW: 15 % (ref 11.5–15.5)
WBC: 6.8 10*3/uL (ref 4.0–10.5)

## 2010-03-22 LAB — BASIC METABOLIC PANEL
BUN: 7 mg/dL (ref 6–23)
CO2: 25 mEq/L (ref 19–32)
Calcium: 9.1 mg/dL (ref 8.4–10.5)
Chloride: 109 mEq/L (ref 96–112)
Creatinine, Ser: 0.89 mg/dL (ref 0.4–1.5)
GFR calc Af Amer: >60 mL/min (ref >60)
GFR calc non Af Amer: >60 mL/min (ref >60)
Glucose, Bld: 104 mg/dL — ABNORMAL HIGH (ref 70–99)
Potassium: 3.9 mEq/L (ref 3.5–5.1)
Sodium: 139 mEq/L (ref 135–145)

## 2010-03-22 LAB — PLATELET INHIBITION P2Y12
P2Y12 % Inhibition: 18 %
Platelet Function  P2Y12: 221 [PRU] (ref 194–418)
Platelet Function Baseline: 270 [PRU] (ref 194–418)

## 2010-03-22 LAB — CARDIAC PANEL(CRET KIN+CKTOT+MB+TROPI)
CK, MB: 1.9 ng/mL (ref 0.3–4.0)
Relative Index: 0.5 (ref 0.0–2.5)
Total CK: 357 U/L — ABNORMAL HIGH (ref 7–232)
Troponin I: 0.07 ng/mL — ABNORMAL HIGH (ref 0.00–0.06)

## 2010-05-04 ENCOUNTER — Telehealth: Payer: Self-pay | Admitting: Internal Medicine

## 2010-05-04 ENCOUNTER — Encounter: Payer: Self-pay | Admitting: Internal Medicine

## 2010-05-04 ENCOUNTER — Ambulatory Visit (INDEPENDENT_AMBULATORY_CARE_PROVIDER_SITE_OTHER): Payer: BC Managed Care – PPO | Admitting: Internal Medicine

## 2010-05-04 DIAGNOSIS — R3915 Urgency of urination: Secondary | ICD-10-CM

## 2010-05-04 DIAGNOSIS — I10 Essential (primary) hypertension: Secondary | ICD-10-CM

## 2010-05-04 MED ORDER — DOXYCYCLINE HYCLATE 100 MG PO TABS: 100.0000 mg | ORAL_TABLET | Freq: Two times a day (BID) | ORAL | Status: AC

## 2010-05-04 NOTE — Telephone Encounter (Signed)
Okay to reschedule for 3-4 months in the future

## 2010-05-04 NOTE — Telephone Encounter (Signed)
Please cx 5/10 ro v- and schedule next rov -3-4 mos out. Thank you

## 2010-05-04 NOTE — Patient Instructions (Signed)
Take your antibiotic as prescribed until ALL of it is gone, but stop if you develop a rash, swelling, or any side effects of the medication.  Contact our office as soon as possible if  there are side effects of the medication.  Return in 6 months for follow-up  Limit your sodium (Salt) intake  Please check your blood pressure on a regular basis.  If it is consistently greater than 150/90, please make an office appointment.

## 2010-05-04 NOTE — Telephone Encounter (Signed)
Please advise 

## 2010-05-04 NOTE — Progress Notes (Signed)
  Subjective:    Patient ID: Manuel Barker, male    DOB: Feb 15, 1950, 60 y.o.   MRN: [id number]  HPI  60 year old patient who has a history of treated hypertension. For the past 3 days he has noted some urethral burning type of discomfort. There has been no discharge and no real aggravation of the discomfort with voiding. He has been treated for nonspecific urethritis in the past. He states that his partner has had a discharge recently and is being checked today for STDs. There have been no penile lesions adenopathy fever or any systemic complaints    Review of Systems  Constitutional: Negative for fever, chills, appetite change and fatigue.  HENT: Negative for hearing loss, ear pain, congestion, sore throat, trouble swallowing, neck stiffness, dental problem, voice change and tinnitus.   Eyes: Negative for pain, discharge and visual disturbance.  Respiratory: Positive for cough. Negative for chest tightness, wheezing and stridor.   Cardiovascular: Negative for chest pain, palpitations and leg swelling.  Gastrointestinal: Negative for nausea, vomiting, abdominal pain, diarrhea, constipation, blood in stool and abdominal distention.  Genitourinary: Positive for penile pain. Negative for urgency, hematuria, flank pain, discharge, difficulty urinating and genital sores.  Musculoskeletal: Negative for myalgias, back pain, joint swelling, arthralgias and gait problem.  Skin: Negative for rash.  Neurological: Negative for dizziness, syncope, speech difficulty, weakness, numbness and headaches.  Hematological: Negative for adenopathy. Does not bruise/bleed easily.  Psychiatric/Behavioral: Negative for behavioral problems and dysphoric mood. The patient is not nervous/anxious.        Objective:   Physical Exam  Constitutional: He appears well-developed and well-nourished. No distress.  HENT:  Mouth/Throat: Oropharynx is clear and moist.  Cardiovascular: Normal rate, regular rhythm and normal heart  sounds.   Pulmonary/Chest: Effort normal and breath sounds normal.  Genitourinary: Penis normal. No penile tenderness.       No urethral discharge  evident.          Assessment & Plan:   Possible urethritis. The patient will be placed on doxycycline 100 mg twice daily for 7 days. If his partner is diagnosed with a specific disorder will contact the office to consider additional therapy  Hypertension stable

## 2010-05-04 NOTE — Telephone Encounter (Signed)
Pt is sch to come in on 05/13/10 for 3 month fup. Pt wants to know if he still needs to keep this ov, since he was just in on 05/04/10 for acute ov?

## 2010-05-05 NOTE — Telephone Encounter (Signed)
I called and lft vm for pt, that Dr Amador Cunas said to cx his ov for 05/13/10, since pt was just in for acute visit. Pt was rsc to November 2012, per pt instruction sheet.

## 2010-05-13 ENCOUNTER — Ambulatory Visit: Payer: Self-pay | Admitting: Internal Medicine

## 2010-05-25 ENCOUNTER — Other Ambulatory Visit: Payer: Self-pay | Admitting: Internal Medicine

## 2010-05-27 ENCOUNTER — Encounter: Payer: Self-pay | Admitting: Internal Medicine

## 2010-05-28 ENCOUNTER — Telehealth: Payer: Self-pay | Admitting: *Deleted

## 2010-05-28 NOTE — Telephone Encounter (Signed)
Agree patient does not require treatment

## 2010-05-28 NOTE — Telephone Encounter (Signed)
Pt wanted Dr.  Kirtland Bouchard to know that his wife became symptomatic again with dysuria, and was diagnosed with an E Coli UTI.  Asking if he should be treated??  Advised prob not it it is just a UTI and not vaginal infection or STD?

## 2010-06-08 ENCOUNTER — Encounter: Payer: Self-pay | Admitting: Pulmonary Disease

## 2010-06-08 ENCOUNTER — Ambulatory Visit (INDEPENDENT_AMBULATORY_CARE_PROVIDER_SITE_OTHER): Payer: BC Managed Care – PPO | Admitting: Pulmonary Disease

## 2010-06-08 VITALS — BP 110/60 | HR 79 | Temp 98.4°F | Ht 66.0 in | Wt 191.0 lb

## 2010-06-08 DIAGNOSIS — G4733 Obstructive sleep apnea (adult) (pediatric): Secondary | ICD-10-CM

## 2010-06-08 NOTE — Progress Notes (Signed)
  Subjective:    Patient ID: Manuel Barker, male    DOB: Sep 19, 1950, 60 y.o.   MRN: [id number]  HPI The pt comes in today for f/u of his osa.  He is wearing cpap compliantly, and denies having issues with mask leaks currently.  He feels he sleeps well, and is satisfied with his daytime alertness.  I have reviewed downloads off the auto settings, and it appear 17cm is adequate for him.    Review of Systems  Constitutional: Negative for fever and unexpected weight change.  HENT: Negative for ear pain, nosebleeds, congestion, sore throat, rhinorrhea, sneezing, trouble swallowing, dental problem, postnasal drip and sinus pressure.   Eyes: Negative for redness and itching.  Respiratory: Negative for cough, chest tightness, shortness of breath and wheezing.   Cardiovascular: Negative for palpitations and leg swelling.  Gastrointestinal: Negative for nausea and vomiting.  Genitourinary: Negative for dysuria.  Musculoskeletal: Negative for joint swelling.  Skin: Negative for rash.  Neurological: Negative for headaches.  Hematological: Bruises/bleeds easily.  Psychiatric/Behavioral: Negative for dysphoric mood. The patient is not nervous/anxious.        Objective:   Physical Exam Ow male in nad No skin breakdown or pressure necrosis from cpap mask No purulence or discharge from nares LE without edema, no cyanosis noted.  Alert and oriented, moves all 4.  Does not appear sleepy.        Assessment & Plan:

## 2010-06-08 NOTE — Patient Instructions (Signed)
Will have advanced change your machine over to a fixed pressure of 17cm.  Please call me if having issues with tolerance or if you feel you are not doing well.  Work on weight loss followup with me in one year if doing well.

## 2010-06-17 NOTE — Assessment & Plan Note (Signed)
The pt is doing well with cpap, and feels it has helped his sleep and daytime alertness.  His optimal fixed pressure appears to be 17cm, and will get his machine changed to this setting.  I have also encouraged him to work on weight loss.

## 2010-07-02 ENCOUNTER — Telehealth: Payer: Self-pay | Admitting: *Deleted

## 2010-07-02 MED ORDER — ATORVASTATIN CALCIUM 20 MG PO TABS: 20.0000 mg | ORAL_TABLET | Freq: Every day | ORAL | Status: DC

## 2010-07-02 NOTE — Telephone Encounter (Signed)
Left message on pt's personal voicemail 

## 2010-07-02 NOTE — Telephone Encounter (Signed)
Switch  to generic Lipitor 20 mg daily  #90 to take one daily  (notified patient that high dose of simvastatin is not recommended  but a  low dose is acceptable with amlodipine)

## 2010-07-02 NOTE — Telephone Encounter (Signed)
Pt has been reading that he should not be taking Amlodipine and Simvastatin together.

## 2010-09-20 ENCOUNTER — Encounter: Payer: Self-pay | Admitting: Internal Medicine

## 2010-09-20 ENCOUNTER — Ambulatory Visit (INDEPENDENT_AMBULATORY_CARE_PROVIDER_SITE_OTHER): Payer: BC Managed Care – PPO | Admitting: Internal Medicine

## 2010-09-20 DIAGNOSIS — I119 Hypertensive heart disease without heart failure: Secondary | ICD-10-CM

## 2010-09-20 DIAGNOSIS — Z79899 Other long term (current) drug therapy: Secondary | ICD-10-CM

## 2010-09-20 DIAGNOSIS — E785 Hyperlipidemia, unspecified: Secondary | ICD-10-CM

## 2010-09-20 DIAGNOSIS — I10 Essential (primary) hypertension: Secondary | ICD-10-CM

## 2010-09-20 DIAGNOSIS — I2510 Atherosclerotic heart disease of native coronary artery without angina pectoris: Secondary | ICD-10-CM

## 2010-09-20 DIAGNOSIS — E7800 Pure hypercholesterolemia, unspecified: Secondary | ICD-10-CM

## 2010-09-20 NOTE — Progress Notes (Signed)
HPIPatient is a 60 year old I saw him for the first time in May when he was sent for evaluation of chest pain. Cardiac ath was done that showed: LMNormal; LAD: 90% apical; 70% D1, : LCx: 95% proximal; 99% mid. The paitent underwent PTCA/DES x3 to the LCx.   SInce seen breathing is good.  No Chest pain.  DOes have L arm pain  Usually at night when asleep.  NOt affected by how sleeping.  Moves up shoulder to L chest.  Occurs most nights.  Usually goes away.  Some days stays around.  No associated SOB.  No dizziness.  Dr. Kirtland Bouchard felt more musculoskeletal   No Known Allergies  Current Outpatient Prescriptions  Medication Sig Dispense Refill  . amLODipine (NORVASC) 5 MG tablet Take 5 mg by mouth daily.        Marland Kitchen aspirin 325 MG tablet Take 325 mg by mouth daily.        Marland Kitchen atorvastatin (LIPITOR) 20 MG tablet Take 1 tablet (20 mg total) by mouth daily.  30 tablet  11  . PLAVIX 75 MG tablet take 1 tablet by mouth once daily  90 tablet  2  . telmisartan (MICARDIS) 40 MG tablet Take 40 mg by mouth daily.          No past medical history on file.  No past surgical history on file.  No family history on file.  History   Social History  . Marital Status: Single    Spouse Name: N/A    Number of Children: N/A  . Years of Education: N/A   Occupational History  . Not on file.   Social History Main Topics  . Smoking status: Never Smoker   . Smokeless tobacco: Never Used  . Alcohol Use: No  . Drug Use: No  . Sexually Active: Not on file   Other Topics Concern  . Not on file   Social History Narrative  . No narrative on file    Review of Systems:  All systems reviewed.  They are negative to the above problem except as previously stated.  Vital Signs: BP 117/78  Pulse 75  Ht 5\' 8"  (1.727 m)  Wt 191 lb (86.637 kg)  BMI 29.04 kg/m2  Physical Exam  Patient is in NAD HEENT:  Normocephalic, atraumatic. EOMI, PERRLA.  Neck: JVP is normal. No thyromegaly. No bruits.  Lungs: clear to  auscultation. No rales no wheezes.  Heart: Regular rate and rhythm. Normal S1, S2. No S3.   No significant murmurs. PMI not displaced.  Abdomen:  Supple, nontender. Normal bowel sounds. No masses. No hepatomegaly.  Extremities:   Good distal pulses throughout. No lower extremity edema.  Musculoskeletal :moving all extremities. With movement of L shoulder patient experiencing pain. Neuro:   alert and oriented x3.  CN II-XII grossly intact.  EKG:  NSR.  75 bpm.  T wave inveriosn V4 to V6, I, AVL.  Assessment and Plan:

## 2010-09-20 NOTE — Patient Instructions (Signed)
Your physician recommends that you schedule a follow-up appointment in: YEAR WITH DR ROSS Your physician recommends that you continue on your current medications as directed. Please refer to the Current Medication list given to you today.  Your physician recommends that you return for lab work in: TUES 09/28/10 AT 8:30 AM  BMET CBC LIPID AND AST  DX 272.0 V58.69 402.1 414.01

## 2010-09-22 NOTE — Assessment & Plan Note (Signed)
BP is under good control.  

## 2010-09-22 NOTE — Assessment & Plan Note (Signed)
Will set up for fasting lipids. 

## 2010-09-22 NOTE — Assessment & Plan Note (Signed)
Patient's current symptoms appear to be musculoskeletal.  Not cardiac. I would keep on same regimen.

## 2010-09-28 ENCOUNTER — Other Ambulatory Visit: Payer: BC Managed Care – PPO | Admitting: *Deleted

## 2010-11-09 ENCOUNTER — Ambulatory Visit: Payer: BC Managed Care – PPO | Admitting: Internal Medicine

## 2010-11-10 ENCOUNTER — Ambulatory Visit (INDEPENDENT_AMBULATORY_CARE_PROVIDER_SITE_OTHER): Payer: BC Managed Care – PPO | Admitting: Internal Medicine

## 2010-11-10 ENCOUNTER — Encounter: Payer: Self-pay | Admitting: Internal Medicine

## 2010-11-10 VITALS — BP 112/70 | Temp 98.2°F | Wt 192.0 lb

## 2010-11-10 DIAGNOSIS — I2510 Atherosclerotic heart disease of native coronary artery without angina pectoris: Secondary | ICD-10-CM

## 2010-11-10 DIAGNOSIS — E785 Hyperlipidemia, unspecified: Secondary | ICD-10-CM

## 2010-11-10 DIAGNOSIS — Z125 Encounter for screening for malignant neoplasm of prostate: Secondary | ICD-10-CM

## 2010-11-10 DIAGNOSIS — N400 Enlarged prostate without lower urinary tract symptoms: Secondary | ICD-10-CM

## 2010-11-10 DIAGNOSIS — I10 Essential (primary) hypertension: Secondary | ICD-10-CM

## 2010-11-10 DIAGNOSIS — Z0000 Encounter for general adult medical examination without abnormal findings: Secondary | ICD-10-CM

## 2010-11-10 DIAGNOSIS — Z23 Encounter for immunization: Secondary | ICD-10-CM

## 2010-11-10 LAB — COMPREHENSIVE METABOLIC PANEL
ALT: 39 U/L (ref 0–53)
AST: 28 U/L (ref 0–37)
Albumin: 4.1 g/dL (ref 3.5–5.2)
Alkaline Phosphatase: 81 U/L (ref 39–117)
BUN: 13 mg/dL (ref 6–23)
CO2: 29 mEq/L (ref 19–32)
Calcium: 9.3 mg/dL (ref 8.4–10.5)
Chloride: 105 mEq/L (ref 96–112)
Creatinine, Ser: 0.8 mg/dL (ref 0.4–1.5)
GFR: 119.79 mL/min (ref >60.00)
Glucose, Bld: 84 mg/dL (ref 70–99)
Potassium: 4.7 mEq/L (ref 3.5–5.1)
Sodium: 140 mEq/L (ref 135–145)
Total Bilirubin: 0.6 mg/dL (ref 0.3–1.2)
Total Protein: 7.3 g/dL (ref 6.0–8.3)

## 2010-11-10 LAB — CBC WITH DIFFERENTIAL/PLATELET
Basophils Absolute: 0 10*3/uL (ref 0.0–0.1)
Basophils Relative: 0.6 % (ref 0.0–3.0)
Eosinophils Absolute: 0.1 10*3/uL (ref 0.0–0.7)
Eosinophils Relative: 2.1 % (ref 0.0–5.0)
HCT: 46 % (ref 39.0–52.0)
Hemoglobin: 15.3 g/dL (ref 13.0–17.0)
Lymphocytes Relative: 24.1 % (ref 12.0–46.0)
Lymphs Abs: 1.5 10*3/uL (ref 0.7–4.0)
MCHC: 33.2 g/dL (ref 30.0–36.0)
MCV: 89.8 fl (ref 78.0–100.0)
Monocytes Absolute: 0.5 10*3/uL (ref 0.1–1.0)
Monocytes Relative: 7.6 % (ref 3.0–12.0)
Neutro Abs: 4.1 10*3/uL (ref 1.4–7.7)
Neutrophils Relative %: 65.6 % (ref 43.0–77.0)
Platelets: 216 10*3/uL (ref 150.0–400.0)
RBC: 5.12 Mil/uL (ref 4.22–5.81)
RDW: 14.2 % (ref 11.5–14.6)
WBC: 6.3 10*3/uL (ref 4.5–10.5)

## 2010-11-10 LAB — LIPID PANEL
Cholesterol: 153 mg/dL (ref 0–200)
HDL: 40.6 mg/dL (ref >39.00)
LDL Cholesterol: 96 mg/dL (ref 0–99)
Total CHOL/HDL Ratio: 4
Triglycerides: 80 mg/dL (ref 0.0–149.0)
VLDL: 16 mg/dL (ref 0.0–40.0)

## 2010-11-10 LAB — PSA: PSA: 2.84 ng/mL (ref 0.10–4.00)

## 2010-11-10 NOTE — Patient Instructions (Signed)
Limit your sodium (Salt) intake    It is important that you exercise regularly, at least 20 minutes 3 to 4 times per week.  If you develop chest pain or shortness of breath seek  medical attention.  Return in one year for follow-up   

## 2010-11-10 NOTE — Progress Notes (Signed)
  Subjective:    Patient ID: Manuel Barker, male    DOB: [birth year], 60 y.o.   MRN: [id number]  HPI 60 year old  patient who has a history of coronary artery disease. He is status post PCI in May 2011. He has been seen recently by cardiology and has done well. His only complaint today is left shoulder pain that seems fairly minor. He has treated hypertension and remains on Lipitor for dyslipidemia. He is doing quite well. He has a strong family history of prostate cancer with 2 brothers affected. No recent PSA.    Review of Systems  Constitutional: Negative for fever, chills, appetite change and fatigue.  HENT: Negative for hearing loss, ear pain, congestion, sore throat, trouble swallowing, neck stiffness, dental problem, voice change and tinnitus.   Eyes: Negative for pain, discharge and visual disturbance.  Respiratory: Negative for cough, chest tightness, wheezing and stridor.   Cardiovascular: Negative for chest pain, palpitations and leg swelling.  Gastrointestinal: Negative for nausea, vomiting, abdominal pain, diarrhea, constipation, blood in stool and abdominal distention.  Genitourinary: Negative for urgency, hematuria, flank pain, discharge, difficulty urinating and genital sores.  Musculoskeletal: Negative for myalgias, back pain, joint swelling, arthralgias and gait problem.  Skin: Negative for rash.  Neurological: Negative for dizziness, syncope, speech difficulty, weakness, numbness and headaches.  Hematological: Negative for adenopathy. Does not bruise/bleed easily.  Psychiatric/Behavioral: Negative for behavioral problems and dysphoric mood. The patient is not nervous/anxious.        Objective:   Physical Exam  Constitutional: He is oriented to person, place, and time. He appears well-developed.  HENT:  Head: Normocephalic.  Right Ear: External ear normal.  Left Ear: External ear normal.  Eyes: Conjunctivae and EOM are normal.  Neck: Normal range of motion.    Cardiovascular: Normal rate and normal heart sounds.   Pulmonary/Chest: Breath sounds normal.  Abdominal: Bowel sounds are normal.  Musculoskeletal: Normal range of motion. He exhibits no edema and no tenderness.  Neurological: He is alert and oriented to person, place, and time.  Psychiatric: He has a normal mood and affect. His behavior is normal.          Assessment & Plan:    Coronary artery disease. Clinically stable. Follow cardiology Hypertension. Controlled we'll continue present regimen Dyslipidemia. We'll check a lipid profile today BPH and family history of prostate cancer. We'll check a PSA

## 2010-11-23 ENCOUNTER — Telehealth: Payer: Self-pay | Admitting: Internal Medicine

## 2010-11-23 NOTE — Telephone Encounter (Signed)
Pt req lab results, especially re: psa. Pls call asap

## 2010-11-23 NOTE — Telephone Encounter (Signed)
Attempt to call - VM - LMTCB if questions - labs all normal

## 2010-12-03 ENCOUNTER — Other Ambulatory Visit: Payer: Self-pay | Admitting: Internal Medicine

## 2011-02-11 ENCOUNTER — Other Ambulatory Visit: Payer: Self-pay | Admitting: Internal Medicine

## 2011-02-24 ENCOUNTER — Other Ambulatory Visit: Payer: Self-pay | Admitting: Internal Medicine

## 2011-06-08 ENCOUNTER — Ambulatory Visit (INDEPENDENT_AMBULATORY_CARE_PROVIDER_SITE_OTHER): Payer: BC Managed Care – PPO | Admitting: Pulmonary Disease

## 2011-06-08 ENCOUNTER — Encounter: Payer: Self-pay | Admitting: Pulmonary Disease

## 2011-06-08 VITALS — BP 122/68 | HR 76 | Temp 98.1°F | Ht 66.0 in | Wt 200.2 lb

## 2011-06-08 DIAGNOSIS — G4733 Obstructive sleep apnea (adult) (pediatric): Secondary | ICD-10-CM

## 2011-06-08 NOTE — Assessment & Plan Note (Signed)
The pt is doing well with cpap, and feels it is helping his sleep and daytime alertness.  He is having no mask or pressure issues.  I have asked him to stay on cpap, and work on weight loss.

## 2011-06-08 NOTE — Progress Notes (Signed)
  Subjective:    Patient ID: Manuel Barker, male    DOB: 01/19/1950, 61 y.o.   MRN: [id number]  HPI The patient comes in today for followup of his known obstructive sleep apnea.  He is wearing CPAP compliantly, and is having no issues with his mask fit or pressure.  He feels that he sleeps well, and has adequate daytime alertness.   Review of Systems  Constitutional: Negative.  Negative for fever and unexpected weight change.  HENT: Negative.  Negative for ear pain, nosebleeds, congestion, sore throat, rhinorrhea, sneezing, trouble swallowing, dental problem, postnasal drip and sinus pressure.   Eyes: Negative.  Negative for redness and itching.  Respiratory: Positive for cough. Negative for chest tightness, shortness of breath and wheezing.   Cardiovascular: Negative.  Negative for palpitations and leg swelling.  Gastrointestinal: Negative.  Negative for nausea and vomiting.  Genitourinary: Negative.  Negative for dysuria.  Musculoskeletal: Negative.  Negative for joint swelling.  Skin: Negative.  Negative for rash.  Neurological: Negative.  Negative for headaches.  Hematological: Negative.  Does not bruise/bleed easily.  Psychiatric/Behavioral: Negative.  Negative for dysphoric mood. The patient is not nervous/anxious.        Objective:   Physical Exam Overweight male in no acute distress No skin breakdown or pressure necrosis from the CPAP mask Lower extremities without edema, no cyanosis Alert and oriented, moves all 4 extremities.  Does not appear to be overly sleepy.       Assessment & Plan:

## 2011-06-08 NOTE — Patient Instructions (Signed)
Continue on cpap, and keep up with mask changes and supplies. Work on weight reduction, and you can get rid of your sleep apnea and cpap. followup with me in one year, but call if issues.

## 2011-07-25 ENCOUNTER — Other Ambulatory Visit: Payer: Self-pay | Admitting: Internal Medicine

## 2011-08-25 ENCOUNTER — Other Ambulatory Visit: Payer: Self-pay | Admitting: Internal Medicine

## 2011-11-23 ENCOUNTER — Other Ambulatory Visit: Payer: Self-pay | Admitting: Internal Medicine

## 2012-01-11 ENCOUNTER — Other Ambulatory Visit: Payer: Self-pay | Admitting: Internal Medicine

## 2012-01-26 ENCOUNTER — Other Ambulatory Visit: Payer: Self-pay | Admitting: Internal Medicine

## 2012-01-28 ENCOUNTER — Other Ambulatory Visit: Payer: Self-pay | Admitting: Internal Medicine

## 2012-02-24 ENCOUNTER — Other Ambulatory Visit: Payer: Self-pay | Admitting: Internal Medicine

## 2012-03-20 ENCOUNTER — Other Ambulatory Visit: Payer: Self-pay | Admitting: Internal Medicine

## 2012-04-10 ENCOUNTER — Ambulatory Visit (INDEPENDENT_AMBULATORY_CARE_PROVIDER_SITE_OTHER): Payer: BC Managed Care – PPO | Admitting: Internal Medicine

## 2012-04-10 ENCOUNTER — Encounter: Payer: Self-pay | Admitting: Internal Medicine

## 2012-04-10 VITALS — BP 140/90 | HR 82 | Temp 98.6°F | Resp 20 | Wt 200.0 lb

## 2012-04-10 DIAGNOSIS — I10 Essential (primary) hypertension: Secondary | ICD-10-CM

## 2012-04-10 DIAGNOSIS — I2510 Atherosclerotic heart disease of native coronary artery without angina pectoris: Secondary | ICD-10-CM

## 2012-04-10 MED ORDER — TELMISARTAN 40 MG PO TABS: ORAL_TABLET | ORAL | Status: DC

## 2012-04-10 MED ORDER — ATORVASTATIN CALCIUM 20 MG PO TABS: ORAL_TABLET | ORAL | Status: DC

## 2012-04-10 MED ORDER — AMLODIPINE BESYLATE 5 MG PO TABS: ORAL_TABLET | ORAL | Status: DC

## 2012-04-10 MED ORDER — CLOPIDOGREL BISULFATE 75 MG PO TABS: ORAL_TABLET | ORAL | Status: DC

## 2012-04-10 NOTE — Progress Notes (Signed)
Subjective:    Patient ID: Manuel Barker, male    DOB: 1950-10-02, 61 y.o.   MRN: [id number]  HPI  62 year old patient who is seen today for general medical followup. He has hypertension and coronary artery disease. He has been asymptomatic. No concerns or complaints.   BP Readings from Last 3 Encounters:  04/10/12 140/90  06/08/11 122/68  11/10/10 112/70    Wt Readings from Last 3 Encounters:  04/10/12 200 lb (90.719 kg)  06/08/11 200 lb 3.2 oz (90.81 kg)  11/10/10 192 lb (87.091 kg)    Past Medical History  Diagnosis Date  . CAD (coronary artery disease)   . Hypertension   . Benign prostatic hypertrophy   . Seizure disorder   . OSA (obstructive sleep apnea)     History   Social History  . Marital Status: Single    Spouse Name: N/A    Number of Children: N/A  . Years of Education: N/A   Occupational History  . Not on file.   Social History Main Topics  . Smoking status: Never Smoker   . Smokeless tobacco: Never Used  . Alcohol Use: No  . Drug Use: No  . Sexually Active: Not on file   Other Topics Concern  . Not on file   Social History Narrative  . No narrative on file    Past Surgical History  Procedure Laterality Date  . Eye surgery  1967    right eye  . Colonoscopy  2005  . Cardiac catheterization  2011  . Coronary stent placement      Family History  Problem Relation Age of Onset  . Heart failure Father   . Hypertension Mother   . Prostate cancer Brother   . Asthma    . Colon polyps      No Known Allergies  Current Outpatient Prescriptions on File Prior to Visit  Medication Sig Dispense Refill  . amLODipine (NORVASC) 5 MG tablet take 1 tablet by mouth once daily  90 tablet  3  . aspirin 325 MG tablet Take 325 mg by mouth daily.        . clopidogrel (PLAVIX) 75 MG tablet take 1 tablet by mouth once daily  90 tablet  3  . NITROSTAT 0.4 MG SL tablet place 1 tablet under the tongue every 5 minutes for UP TO 3 doses if needed  25 tablet  3    No current facility-administered medications on file prior to visit.    BP 140/90  Pulse 82  Temp(Src) 98.6 F (37 C) (Oral)  Resp 20  Wt 200 lb (90.719 kg)  BMI 32.3 kg/m2  SpO2 97%     Review of Systems  Constitutional: Negative for fever, chills, appetite change and fatigue.  HENT: Negative for hearing loss, ear pain, congestion, sore throat, trouble swallowing, neck stiffness, dental problem, voice change and tinnitus.   Eyes: Negative for pain, discharge and visual disturbance.  Respiratory: Negative for cough, chest tightness, wheezing and stridor.   Cardiovascular: Negative for chest pain, palpitations and leg swelling.  Gastrointestinal: Negative for nausea, vomiting, abdominal pain, diarrhea, constipation, blood in stool and abdominal distention.  Genitourinary: Negative for urgency, hematuria, flank pain, discharge, difficulty urinating and genital sores.  Musculoskeletal: Negative for myalgias, back pain, joint swelling, arthralgias and gait problem.  Skin: Negative for rash.  Neurological: Negative for dizziness, syncope, speech difficulty, weakness, numbness and headaches.  Hematological: Negative for adenopathy. Does not bruise/bleed easily.  Psychiatric/Behavioral: Negative for behavioral problems  and dysphoric mood. The patient is not nervous/anxious.        Objective:   Physical Exam  Constitutional: He is oriented to person, place, and time. He appears well-developed.  HENT:  Head: Normocephalic.  Right Ear: External ear normal.  Left Ear: External ear normal.  Eyes: Conjunctivae and EOM are normal.  Neck: Normal range of motion.  Cardiovascular: Normal rate and normal heart sounds.   Pulmonary/Chest: Breath sounds normal.  Abdominal: Bowel sounds are normal.  Musculoskeletal: Normal range of motion. He exhibits no edema and no tenderness.  Neurological: He is alert and oriented to person, place, and time.  Psychiatric: He has a normal mood and  affect. His behavior is normal.          Assessment & Plan:   Hypertension. Reasonable control blood pressure and a high normal range Coronary artery disease asymptomatic  Medicines refilled Schedule CPX

## 2012-04-10 NOTE — Patient Instructions (Signed)

## 2012-08-03 ENCOUNTER — Other Ambulatory Visit: Payer: BC Managed Care – PPO

## 2012-08-10 ENCOUNTER — Ambulatory Visit (INDEPENDENT_AMBULATORY_CARE_PROVIDER_SITE_OTHER): Payer: BC Managed Care – PPO | Admitting: Internal Medicine

## 2012-08-10 ENCOUNTER — Encounter: Payer: Self-pay | Admitting: Internal Medicine

## 2012-08-10 VITALS — BP 114/80 | HR 77 | Temp 98.2°F | Resp 20 | Ht 65.25 in | Wt 194.0 lb

## 2012-08-10 DIAGNOSIS — I2510 Atherosclerotic heart disease of native coronary artery without angina pectoris: Secondary | ICD-10-CM

## 2012-08-10 DIAGNOSIS — Z0000 Encounter for general adult medical examination without abnormal findings: Secondary | ICD-10-CM

## 2012-08-10 DIAGNOSIS — E785 Hyperlipidemia, unspecified: Secondary | ICD-10-CM

## 2012-08-10 DIAGNOSIS — I10 Essential (primary) hypertension: Secondary | ICD-10-CM

## 2012-08-10 DIAGNOSIS — Z23 Encounter for immunization: Secondary | ICD-10-CM

## 2012-08-10 DIAGNOSIS — N400 Enlarged prostate without lower urinary tract symptoms: Secondary | ICD-10-CM

## 2012-08-10 LAB — CBC WITH DIFFERENTIAL/PLATELET
Basophils Absolute: 0 10*3/uL (ref 0.0–0.1)
Basophils Relative: 0.5 % (ref 0.0–3.0)
Eosinophils Absolute: 0.2 10*3/uL (ref 0.0–0.7)
Eosinophils Relative: 2.7 % (ref 0.0–5.0)
HCT: 46.4 % (ref 39.0–52.0)
Hemoglobin: 15.3 g/dL (ref 13.0–17.0)
Lymphocytes Relative: 19.3 % (ref 12.0–46.0)
Lymphs Abs: 1.1 10*3/uL (ref 0.7–4.0)
MCHC: 33 g/dL (ref 30.0–36.0)
MCV: 87.8 fl (ref 78.0–100.0)
Monocytes Absolute: 0.5 10*3/uL (ref 0.1–1.0)
Monocytes Relative: 7.9 % (ref 3.0–12.0)
Neutro Abs: 4.1 10*3/uL (ref 1.4–7.7)
Neutrophils Relative %: 69.6 % (ref 43.0–77.0)
Platelets: 234 10*3/uL (ref 150.0–400.0)
RBC: 5.28 Mil/uL (ref 4.22–5.81)
RDW: 14 % (ref 11.5–14.6)
WBC: 6 10*3/uL (ref 4.5–10.5)

## 2012-08-10 LAB — COMPREHENSIVE METABOLIC PANEL
ALT: 24 U/L (ref 0–53)
AST: 19 U/L (ref 0–37)
Albumin: 4.1 g/dL (ref 3.5–5.2)
Alkaline Phosphatase: 75 U/L (ref 39–117)
BUN: 11 mg/dL (ref 6–23)
CO2: 27 mEq/L (ref 19–32)
Calcium: 9.9 mg/dL (ref 8.4–10.5)
Chloride: 104 mEq/L (ref 96–112)
Creatinine, Ser: 1.2 mg/dL (ref 0.4–1.5)
GFR: 75.98 mL/min (ref >60.00)
Glucose, Bld: 85 mg/dL (ref 70–99)
Potassium: 4.3 mEq/L (ref 3.5–5.1)
Sodium: 139 mEq/L (ref 135–145)
Total Bilirubin: 0.7 mg/dL (ref 0.3–1.2)
Total Protein: 7.3 g/dL (ref 6.0–8.3)

## 2012-08-10 LAB — TSH: TSH: 0.52 u[IU]/mL (ref 0.35–5.50)

## 2012-08-10 LAB — LIPID PANEL
Cholesterol: 146 mg/dL (ref 0–200)
HDL: 34.8 mg/dL — ABNORMAL LOW (ref >39.00)
LDL Cholesterol: 92 mg/dL (ref 0–99)
Total CHOL/HDL Ratio: 4
Triglycerides: 96 mg/dL (ref 0.0–149.0)
VLDL: 19.2 mg/dL (ref 0.0–40.0)

## 2012-08-10 LAB — PSA: PSA: 3.66 ng/mL (ref 0.10–4.00)
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
## 2012-08-10 NOTE — Patient Instructions (Signed)
Limit your sodium (Salt) intake    It is important that you exercise regularly, at least 20 minutes 3 to 4 times per week.  If you develop chest pain or shortness of breath seek  medical attention.  You need to lose weight.  Consider a lower calorie diet and regular exercise.  Cardiology followup  Return in one year for follow-up

## 2012-08-22 ENCOUNTER — Other Ambulatory Visit: Payer: Self-pay | Admitting: Internal Medicine

## 2012-08-25 ENCOUNTER — Other Ambulatory Visit: Payer: Self-pay | Admitting: Internal Medicine

## 2012-10-11 ENCOUNTER — Ambulatory Visit (INDEPENDENT_AMBULATORY_CARE_PROVIDER_SITE_OTHER): Payer: BC Managed Care – PPO | Admitting: Internal Medicine

## 2012-10-11 ENCOUNTER — Encounter: Payer: Self-pay | Admitting: Internal Medicine

## 2012-10-11 VITALS — BP 114/80 | HR 79 | Ht 66.0 in | Wt 197.8 lb

## 2012-10-11 DIAGNOSIS — E785 Hyperlipidemia, unspecified: Secondary | ICD-10-CM

## 2012-10-11 MED ORDER — ATORVASTATIN CALCIUM 40 MG PO TABS: ORAL_TABLET | ORAL | Status: DC

## 2012-10-11 MED ORDER — ASPIRIN 81 MG PO TABS: 81.0000 mg | ORAL_TABLET | Freq: Every day | ORAL | Status: AC

## 2012-10-11 NOTE — Progress Notes (Signed)
HPI HPIPatient is a 62 year old  With a history of CAD. Cardiac ath in 2011 was done that showed: LMNormal; LAD: 90% apical; 70% D1, : LCx: 95% proximal; 99% mid. The paitent underwent PTCA/DES x3 to the LCx.  The patient was last in clniic in Sept 2012.   SInce seen he hs done well  NO CP  No SOB No palpitations    No Known Allergies  Current Outpatient Prescriptions  Medication Sig Dispense Refill  . amLODipine (NORVASC) 5 MG tablet take 1 tablet by mouth once daily  90 tablet  3  . aspirin 325 MG tablet Take 325 mg by mouth daily.        Marland Kitchen atorvastatin (LIPITOR) 20 MG tablet take 1 tablet by mouth once daily  90 tablet  1  . clopidogrel (PLAVIX) 75 MG tablet take 1 tablet by mouth once daily  90 tablet  3  . NITROSTAT 0.4 MG SL tablet place 1 tablet under the tongue every 5 minutes for UP TO 3 doses if needed  25 tablet  3  . telmisartan (MICARDIS) 40 MG tablet take 1 tablet by mouth once daily  90 tablet  1   No current facility-administered medications for this visit.    Past Medical History  Diagnosis Date  . CAD (coronary artery disease)   . Hypertension   . Benign prostatic hypertrophy   . Seizure disorder   . OSA (obstructive sleep apnea)     Past Surgical History  Procedure Laterality Date  . Eye surgery  1967    right eye  . Colonoscopy  2005  . Cardiac catheterization  2011  . Coronary stent placement      Family History  Problem Relation Age of Onset  . Heart failure Father   . Hypertension Mother   . Prostate cancer Brother   . Asthma    . Colon polyps      History   Social History  . Marital Status: Single    Spouse Name: N/A    Number of Children: N/A  . Years of Education: N/A   Occupational History  . Not on file.   Social History Main Topics  . Smoking status: Never Smoker   . Smokeless tobacco: Never Used  . Alcohol Use: No  . Drug Use: No  . Sexual Activity: Not on file   Other Topics Concern  . Not on file   Social History  Narrative  . No narrative on file    Review of Systems:  All systems reviewed.  They are negative to the above problem except as previously stated.  Vital Signs: BP 114/80  Pulse 79  Ht 5\' 6"  (1.676 m)  Wt 197 lb 12.8 oz (89.721 kg)  BMI 31.94 kg/m2  SpO2 97%  Physical Exam Patient is in NAD HEENT:  Normocephalic, atraumatic. EOMI, PERRLA.  Neck: JVP is normal.  No bruits.  Lungs: clear to auscultation. No rales no wheezes.  Heart: Regular rate and rhythm. Normal S1, S2. No S3.   No significant murmurs. PMI not displaced.  Abdomen:  Supple, nontender. Normal bowel sounds. No masses. No hepatomegaly.  Extremities:   Good distal pulses throughout. No lower extremity edema.  Musculoskeletal :moving all extremities.  Neuro:   alert and oriented x3.  CN II-XII grossly intact.  EKG  SR T wave inversion I, II, III AVF, V3 to V6  Present in previous ECGs Assessment and Plan:  1  CAD  Doing well .  Will check on continued use of plavix  Can cut back on asa to 81 mg. 2.  HL  LDL was in 90s on recent check  I would increase lipitor to 40  F/U lipids and ast in 8 wks.

## 2012-10-11 NOTE — Patient Instructions (Signed)
Medication changes:  Increase: Lipitor to 40mg  each evening                                     Decrease: Aspirin to 81 mg daily  Your physician recommends that you return for lab work in: 8 weeks for fasting lipid & AST levels  Your physician wants you to follow-up in: 1 year with DR. Ross.  You will receive a reminder letter in the mail two months in advance. If you don't receive a letter, please call our office to schedule the follow-up appointment.

## 2012-10-23 ENCOUNTER — Telehealth: Payer: Self-pay | Admitting: *Deleted

## 2012-10-23 NOTE — Telephone Encounter (Signed)
Per dr Tenny Craw, Reviewed cath from 2011. Given multiple stents to RCA (2 overlapping) I would keep on Plavix as long as he is tolerating.

## 2012-10-29 ENCOUNTER — Encounter: Payer: Self-pay | Admitting: Cardiology

## 2013-01-21 ENCOUNTER — Encounter: Payer: Self-pay | Admitting: Gastroenterology

## 2013-02-25 ENCOUNTER — Ambulatory Visit (INDEPENDENT_AMBULATORY_CARE_PROVIDER_SITE_OTHER): Payer: BC Managed Care – PPO | Admitting: Family

## 2013-02-25 ENCOUNTER — Telehealth: Payer: Self-pay | Admitting: Internal Medicine

## 2013-02-25 ENCOUNTER — Encounter: Payer: Self-pay | Admitting: Family

## 2013-02-25 ENCOUNTER — Ambulatory Visit (INDEPENDENT_AMBULATORY_CARE_PROVIDER_SITE_OTHER)
Admission: RE | Admit: 2013-02-25 | Discharge: 2013-02-25 | Disposition: A | Discharge status: Home / Self Care | Payer: BC Managed Care – PPO | Source: Ambulatory Visit | Attending: Family | Admitting: Family

## 2013-02-25 VITALS — BP 120/78 | HR 100 | Temp 100.2°F | Wt 200.0 lb

## 2013-02-25 DIAGNOSIS — J209 Acute bronchitis, unspecified: Secondary | ICD-10-CM

## 2013-02-25 MED ORDER — DOXYCYCLINE HYCLATE 100 MG PO TABS
100.0000 mg | ORAL_TABLET | Freq: Two times a day (BID) | ORAL | Status: DC
Start: 2013-02-25 — End: 2013-10-31

## 2013-02-25 MED ORDER — METHYLPREDNISOLONE 4 MG PO KIT: PACK | ORAL | Status: AC

## 2013-02-25 MED ORDER — GUAIFENESIN-CODEINE 100-10 MG/5ML PO SYRP: 5.0000 mL | ORAL_SOLUTION | Freq: Three times a day (TID) | ORAL | Status: DC | PRN

## 2013-02-25 NOTE — Telephone Encounter (Signed)
Patient Information:  Caller Name: Keven  Phone: [phone]  Patient: Manuel Barker, Manuel Barker  Gender: Male  DOB: Jan 23, 1950  Age: 63 Years  PCP: Bluford Kaufmann (Family Practice > 41yrs old)  Office Follow Up:  Does the office need to follow up with this patient?: No  Instructions For The Office: N/A   Symptoms  Reason For Call & Symptoms: Cough and congestion stated 02/23/13 and Sunday was better and still coughing.  Temp is now 100.8.  Reviewed Health History In EMR: Yes  Reviewed Medications In EMR: Yes  Reviewed Allergies In EMR: Yes  Reviewed Surgeries / Procedures: Yes  Date of Onset of Symptoms: 02/23/2013  Treatments Tried: Coridicen HBP  and Cephacol  Treatments Tried Worked: No  Any Fever: Yes  Fever Taken: Oral  Fever Time Of Reading: 00:00:00  Fever Last Reading: 100.8  Guideline(s) Used:  Cough  Disposition Per Guideline:   Go to Office Now  Reason For Disposition Reached:   Fever > 100.5 F (38.1 C) and over 61 years of age  Advice Given:  N/A  Patient Will Follow Care Advice:  YES  Appointment Scheduled:  02/25/2013 15:00:00 Appointment Scheduled Provider:  Roxy Cedar [facility])

## 2013-02-25 NOTE — Patient Instructions (Signed)

## 2013-02-25 NOTE — Telephone Encounter (Signed)
Attempted to reach caller as a promise call back regarding cough but received VM; left message on VM to call office back if still needing our assistance Cd/CAN

## 2013-02-25 NOTE — Progress Notes (Signed)
Pre visit review using our clinic review tool, if applicable. No additional management support is needed unless otherwise documented below in the visit note. 

## 2013-02-25 NOTE — Progress Notes (Signed)
Subjective:    Patient ID: Manuel Barker, male    DOB: 1950-11-28, 63 y.o.   MRN: [id number]  Cough Associated symptoms include chills, a fever, rhinorrhea, a sore throat and wheezing.  Fever  Associated symptoms include congestion, coughing, a sore throat and wheezing.   63 y.o. Black male non smoker presents today with chief complaint of "cough and fever". Fever and cough started on Saturday, pt describes cough as productive and deep with green/yellow discharge. Fever is described as intermitten with a tmax of 100.8. Took OTC cough medicine on Saturday and Sunday which helped briefly but fever and productive cough returned today. Pt acknowledges fever, chills. Denies myalgia, fatigue, head ache.     Review of Systems  Constitutional: Positive for fever and chills.  HENT: Positive for congestion, rhinorrhea and sore throat.   Eyes: Negative.   Respiratory: Positive for cough and wheezing.   Cardiovascular: Negative.   Gastrointestinal: Negative.   Endocrine: Negative.   Genitourinary: Negative.   Musculoskeletal: Negative.   Skin: Negative.   Allergic/Immunologic: Negative.   Neurological: Negative.   Hematological: Negative.   Psychiatric/Behavioral: Negative.    Past Medical History  Diagnosis Date  . CAD (coronary artery disease)   . Hypertension   . Benign prostatic hypertrophy   . Seizure disorder   . OSA (obstructive sleep apnea)     History   Social History  . Marital Status: Single    Spouse Name: N/A    Number of Children: N/A  . Years of Education: N/A   Occupational History  . Not on file.   Social History Main Topics  . Smoking status: Never Smoker   . Smokeless tobacco: Never Used  . Alcohol Use: No  . Drug Use: No  . Sexual Activity: Not on file   Other Topics Concern  . Not on file   Social History Narrative  . No narrative on file    Past Surgical History  Procedure Laterality Date  . Eye surgery  1967    right eye  . Colonoscopy   2005  . Cardiac catheterization  2011  . Coronary stent placement      Family History  Problem Relation Age of Onset  . Heart failure Father   . Hypertension Mother   . Prostate cancer Brother   . Asthma    . Colon polyps      No Known Allergies  Current Outpatient Prescriptions on File Prior to Visit  Medication Sig Dispense Refill  . amLODipine (NORVASC) 5 MG tablet take 1 tablet by mouth once daily  90 tablet  3  . aspirin 81 MG tablet Take 1 tablet (81 mg total) by mouth daily.      Marland Kitchen atorvastatin (LIPITOR) 40 MG tablet take 1 tablet by mouth once daily  90 tablet  3  . clopidogrel (PLAVIX) 75 MG tablet take 1 tablet by mouth once daily  90 tablet  3  . NITROSTAT 0.4 MG SL tablet place 1 tablet under the tongue every 5 minutes for UP TO 3 doses if needed  25 tablet  3  . telmisartan (MICARDIS) 40 MG tablet take 1 tablet by mouth once daily  90 tablet  1   No current facility-administered medications on file prior to visit.    BP 120/78  Pulse 100  Temp(Src) 100.2 F (37.9 C) (Oral)  Wt 200 lb (90.719 kg)chart    Objective:   Physical Exam  Constitutional: He is oriented to person, place,  and time. He appears well-developed and well-nourished. He is active.  HENT:  Head: Normocephalic.  Right Ear: Tympanic membrane, external ear and ear canal normal.  Left Ear: Tympanic membrane, external ear and ear canal normal.  Nose: Rhinorrhea present.  Mouth/Throat: Uvula is midline and mucous membranes are normal. Posterior oropharyngeal erythema present.  Cardiovascular: Normal rate, regular rhythm and normal heart sounds.   Pulmonary/Chest: Effort normal. He has wheezes in the right upper field and the left upper field.  Abdominal: Soft. Normal appearance and bowel sounds are normal.  Neurological: He is alert and oriented to person, place, and time.  Skin: Skin is warm, dry and intact.  Psychiatric: He has a normal mood and affect. His speech is normal and behavior is  normal.          Assessment & Plan:  63 y.o. Black male presents with chief complaint of "cough and fever".  - Bronchitis: Start Medrol pack   - guaifenesin-codeine 5 ml TID  - R/o Pneumonia: Will get chest xray due to increased wheezing primarily to right side and fever.  - Education: Stay hydrated, Do not drive while taking codeine.  - Follow up: Will call pt with results of chest x-ray.

## 2013-02-26 ENCOUNTER — Telehealth: Payer: Self-pay | Admitting: Internal Medicine

## 2013-02-26 NOTE — Telephone Encounter (Signed)
Noted  

## 2013-02-26 NOTE — Telephone Encounter (Signed)
[facility] Triage Call Report Triage Record Num: [id number] Operator: Truddie Crumble Patient Name: Manuel Barker Call Date & Time: 02/25/2013 6:47:28PM Patient Phone: [phone] PCP: Marletta Lor Patient Gender: Male PCP Fax : [phone] Patient DOB: 20-Dec-1950 Practice Name: [facility] Reason for Call: Caller: Marlan/Patient; PCP: Bluford Kaufmann (Family Practice > 36yrs old); CB#: [phone]; Call regarding: Saw PA Megan Salon today and was given Dx of pneumonia. How long will he be contagious if he starts taking meds today? Rn verified pt is on ABX and explained that penumonia can be caused by many different organisms; usually people are no longer contagious after being on ABX for 24-48 hrs. Rn advised if sx's are not improving to consider he may still be contagious. Voided understanding. Protocol(s) Used: Information Only Call; No Symptom Triage (Adult) Recommended Outcome per Protocol: Provide Information or Advice Only Reason for Outcome: Follow-up call to recent contact; no triage required. Information provided from past call documentation, approved references or experience. Care Advice: ~

## 2013-03-18 ENCOUNTER — Telehealth: Payer: Self-pay | Admitting: Internal Medicine

## 2013-03-18 NOTE — Telephone Encounter (Signed)
Patient Information:  Caller Name: Jaquil  Phone: [phone]  Patient: Manuel Barker, Manuel Barker  Gender: Male  DOB: 1950/06/30  Age: 63 Years  PCP: Bluford Kaufmann (Family Practice > 49yrs old)  Office Follow Up:  Does the office need to follow up with this patient?: No  Instructions For The Office: N/A  RN Note:  Offered and declined appointment.  Symptoms  Reason For Call & Symptoms: Recurrent, mildly productive cough without fever.  Diagnosed with pneumonia per chest xray 02/26/13.  Completed Medrol Pack and Doxicycline.  Reviewed Health History In EMR: Yes  Reviewed Medications In EMR: Yes  Reviewed Allergies In EMR: Yes  Reviewed Surgeries / Procedures: Yes  Date of Onset of Symptoms: 03/15/2013  Treatments Tried: Coricedin  Treatments Tried Worked: Yes  Guideline(s) Used:  Cough  Disposition Per Guideline:   Home Care  Reason For Disposition Reached:   Cough with no complications  Advice Given:  Reassurance  Coughing is the way that our lungs remove irritants and mucus. It helps protect our lungs from getting pneumonia.  You can get a dry hacking cough after a chest cold. Sometimes this type of cough can last 1-3 weeks, and be worse at night.  You can also get a cough after being exposed to irritating substances like smoke, strong perfumes, and dust.  Cough Medicines:  OTC Cough Syrups: The most common cough suppressant in OTC cough medications is dextromethorphan. Often the letters "DM" appear in the name.  OTC Cough Drops: Cough drops can help a lot, especially for mild coughs. They reduce coughing by soothing your irritated throat and removing that tickle sensation in the back of the throat. Cough drops also have the advantage of portability - you can carry them with you.  Home Remedy - Hard Candy: Hard candy works just as well as medicine-flavored OTC cough drops. Diabetics should use sugar-free candy.  Home Remedy - Honey: This old home remedy has been shown to help  decrease coughing at night. The adult dosage is 2 teaspoons (10 ml) at bedtime. Honey should not be given to infants under one year of age.  OTC Cough Syrup - Dextromethorphan:  Cough syrups containing the cough suppressant dextromethorphan (DM) may help decrease your cough. Cough syrups work best for coughs that keep you awake at night. They can also sometimes help in the late stages of a respiratory infection when the cough is dry and hacking. They can be used along with cough drops.  Examples: Benylin, Robitussin DM, Vicks 44 Cough Relief  Caution - Dextromethorphan:   Do not try to completely suppress coughs that produce mucus and phlegm. Remember that coughing is helpful in bringing up mucus from the lungs and preventing pneumonia.  Coughing Spasms:  Drink warm fluids. Inhale warm mist (Reason: both relax the airway and loosen up the phlegm).  Suck on cough drops or hard candy to coat the irritated throat.  Prevent Dehydration:  Drink adequate liquids.  This will help soothe an irritated or dry throat and loosen up the phlegm.  Expected Course:   The expected course depends on what is causing the cough.  Viral bronchitis (chest cold) causes a cough that lasts 1 to 3 weeks. Sometimes you may cough up lots of phlegm (sputum, mucus). The mucus can normally be white, gray, yellow, or green.  Call Back If:  Difficulty breathing  Cough lasts more than 3 weeks  Fever lasts > 3 days  You become worse.  Patient Will Follow Care Advice:  YES

## 2013-03-19 NOTE — Telephone Encounter (Signed)
FYI

## 2013-04-27 ENCOUNTER — Other Ambulatory Visit: Payer: Self-pay | Admitting: Internal Medicine

## 2013-05-14 ENCOUNTER — Other Ambulatory Visit: Payer: Self-pay | Admitting: Internal Medicine

## 2013-06-06 ENCOUNTER — Other Ambulatory Visit: Payer: Self-pay | Admitting: Internal Medicine

## 2013-07-28 ENCOUNTER — Other Ambulatory Visit: Payer: Self-pay | Admitting: Internal Medicine

## 2013-09-07 ENCOUNTER — Other Ambulatory Visit: Payer: Self-pay | Admitting: Internal Medicine

## 2013-09-12 ENCOUNTER — Other Ambulatory Visit: Payer: Self-pay | Admitting: Internal Medicine

## 2013-10-27 ENCOUNTER — Other Ambulatory Visit: Payer: Self-pay | Admitting: Internal Medicine

## 2013-10-31 ENCOUNTER — Ambulatory Visit (INDEPENDENT_AMBULATORY_CARE_PROVIDER_SITE_OTHER): Payer: BC Managed Care – PPO | Admitting: Internal Medicine

## 2013-10-31 ENCOUNTER — Ambulatory Visit (INDEPENDENT_AMBULATORY_CARE_PROVIDER_SITE_OTHER)
Admission: RE | Admit: 2013-10-31 | Discharge: 2013-10-31 | Disposition: A | Discharge status: Home / Self Care | Payer: BC Managed Care – PPO | Source: Ambulatory Visit | Attending: Internal Medicine | Admitting: Internal Medicine

## 2013-10-31 ENCOUNTER — Encounter: Payer: Self-pay | Admitting: Internal Medicine

## 2013-10-31 VITALS — BP 134/90 | HR 77 | Temp 98.2°F | Resp 20 | Ht 66.0 in | Wt 207.0 lb

## 2013-10-31 DIAGNOSIS — I10 Essential (primary) hypertension: Secondary | ICD-10-CM

## 2013-10-31 DIAGNOSIS — R05 Cough: Secondary | ICD-10-CM

## 2013-10-31 DIAGNOSIS — R053 Chronic cough: Secondary | ICD-10-CM

## 2013-10-31 DIAGNOSIS — Z23 Encounter for immunization: Secondary | ICD-10-CM

## 2013-10-31 MED ORDER — FLUTICASONE PROPIONATE 50 MCG/ACT NA SUSP: 2.0000 | Freq: Every day | NASAL | Status: DC

## 2013-10-31 MED ORDER — BENZONATATE 200 MG PO CAPS: 200.0000 mg | ORAL_CAPSULE | Freq: Two times a day (BID) | ORAL | Status: DC | PRN

## 2013-10-31 NOTE — Progress Notes (Signed)
Pre visit review using our clinic review tool, if applicable. No additional management support is needed unless otherwise documented below in the visit note. 

## 2013-10-31 NOTE — Patient Instructions (Addendum)
Take over-the-counter expectorants and cough medications such as  Mucinex DM.    CXR as discussed

## 2013-10-31 NOTE — Progress Notes (Signed)
Subjective:    Patient ID: Manuel Barker, male    DOB: [birth year], 63 y.o.   MRN: [id number]  HPI 63 year old patient who presents with a chief complaint of chronic cough for the past 3-4 months.  He states that he was treated for pneumonia in late February.  Chest x-ray was reviewed.  The revealed a retrocardiac density and follow-up imaging was recommended.  The patient felt that he improved with resolution of his cough and acute symptoms 3-4 months ago he began having chronic nonproductive cough.  He denies any postnasal drip, sore throat, sputum production.  Denies any reflux symptoms.  Past Medical History  Diagnosis Date  . CAD (coronary artery disease)   . Hypertension   . Benign prostatic hypertrophy   . Seizure disorder   . OSA (obstructive sleep apnea)     History   Social History  . Marital Status: Single    Spouse Name: N/A    Number of Children: N/A  . Years of Education: N/A   Occupational History  . Not on file.   Social History Main Topics  . Smoking status: Never Smoker   . Smokeless tobacco: Never Used  . Alcohol Use: No  . Drug Use: No  . Sexual Activity: Not on file   Other Topics Concern  . Not on file   Social History Narrative  . No narrative on file    Past Surgical History  Procedure Laterality Date  . Eye surgery  1967    right eye  . Colonoscopy  2005  . Cardiac catheterization  2011  . Coronary stent placement      Family History  Problem Relation Age of Onset  . Heart failure Father   . Hypertension Mother   . Prostate cancer Brother   . Asthma    . Colon polyps      No Known Allergies  Current Outpatient Prescriptions on File Prior to Visit  Medication Sig Dispense Refill  . amLODipine (NORVASC) 5 MG tablet take 1 tablet by mouth once daily  90 tablet  0  . aspirin 81 MG tablet Take 1 tablet (81 mg total) by mouth daily.      Marland Kitchen atorvastatin (LIPITOR) 40 MG tablet take 1 tablet by mouth once daily  90 tablet  3  .  clopidogrel (PLAVIX) 75 MG tablet take 1 tablet by mouth once daily  90 tablet  0  . NITROSTAT 0.4 MG SL tablet place 1 tablet under the tongue every 5 minutes for UP TO 3 doses if needed  25 tablet  3  . telmisartan (MICARDIS) 40 MG tablet take 1 tablet by mouth once daily  90 tablet  0   No current facility-administered medications on file prior to visit.    BP 134/90  Pulse 77  Temp(Src) 98.2 F (36.8 C) (Oral)  Resp 20  Ht 5\' 6"  (1.676 m)  Wt 207 lb (93.895 kg)  BMI 33.43 kg/m2  SpO2 97%      Review of Systems  Constitutional: Negative for fever, chills, appetite change and fatigue.  HENT: Negative for congestion, dental problem, ear pain, hearing loss, sore throat, tinnitus, trouble swallowing and voice change.   Eyes: Negative for pain, discharge and visual disturbance.  Respiratory: Positive for cough. Negative for chest tightness, wheezing and stridor.   Cardiovascular: Negative for chest pain, palpitations and leg swelling.  Gastrointestinal: Negative for nausea, vomiting, abdominal pain, diarrhea, constipation, blood in stool and abdominal distention.  Genitourinary: Negative  for urgency, hematuria, flank pain, discharge, difficulty urinating and genital sores.  Musculoskeletal: Negative for arthralgias, back pain, gait problem, joint swelling, myalgias and neck stiffness.  Skin: Negative for rash.  Neurological: Negative for dizziness, syncope, speech difficulty, weakness, numbness and headaches.  Hematological: Negative for adenopathy. Does not bruise/bleed easily.  Psychiatric/Behavioral: Negative for behavioral problems and dysphoric mood. The patient is not nervous/anxious.        Objective:   Physical Exam  Constitutional: He is oriented to person, place, and time. He appears well-developed.  HENT:  Head: Normocephalic.  Right Ear: External ear normal.  Left Ear: External ear normal.  Eyes: Conjunctivae and EOM are normal.  Neck: Normal range of motion.    Cardiovascular: Normal rate and normal heart sounds.   Pulmonary/Chest: Effort normal and breath sounds normal. No respiratory distress. He has no wheezes. He has no rales.  Abdominal: Bowel sounds are normal.  Musculoskeletal: Normal range of motion. He exhibits no edema and no tenderness.  Neurological: He is alert and oriented to person, place, and time.  Psychiatric: He has a normal mood and affect. His behavior is normal.          Assessment & Plan:   Chronic cough.  Will check a follow-up chest x-ray.  May need chest CT if retrocardiac density is still present.  We'll treat for possible upper airway cough syndrome with fluticasone.  Will place on Mucinex DM as well as Tessalon.  Recheck 3 weeks Hypertension, stable

## 2013-11-01 ENCOUNTER — Telehealth: Payer: Self-pay | Admitting: Internal Medicine

## 2013-11-01 NOTE — Telephone Encounter (Signed)
emmi emailed °

## 2013-11-21 ENCOUNTER — Encounter: Payer: Self-pay | Admitting: Internal Medicine

## 2013-11-21 ENCOUNTER — Ambulatory Visit (INDEPENDENT_AMBULATORY_CARE_PROVIDER_SITE_OTHER): Payer: BC Managed Care – PPO | Admitting: Internal Medicine

## 2013-11-21 ENCOUNTER — Telehealth: Payer: Self-pay | Admitting: Internal Medicine

## 2013-11-21 VITALS — BP 142/90 | HR 73 | Temp 98.3°F | Resp 20 | Ht 66.0 in | Wt 205.0 lb

## 2013-11-21 DIAGNOSIS — R569 Unspecified convulsions: Secondary | ICD-10-CM

## 2013-11-21 DIAGNOSIS — E785 Hyperlipidemia, unspecified: Secondary | ICD-10-CM

## 2013-11-21 DIAGNOSIS — I10 Essential (primary) hypertension: Secondary | ICD-10-CM

## 2013-11-21 MED ORDER — ATORVASTATIN CALCIUM 40 MG PO TABS: ORAL_TABLET | ORAL | Status: DC

## 2013-11-21 NOTE — Progress Notes (Signed)
Subjective:    Patient ID: Manuel Barker, male    DOB: 07/15/1950, 63 y.o.   MRN: [id number]  HPI  63 year old patient who has treated hypertension.  He is seen today in follow-up due to a chronic cough.  He was treated earlier in the year for community-acquired pneumonia and has had persistent cough.  Initial chest x-ray suggested a retrocardiac density, but follow-up chest x-ray has normalized.  His cough has largely resolved and only has minimal cough in the early evening hours.  He generally feels well.  Past Medical History  Diagnosis Date  . CAD (coronary artery disease)   . Hypertension   . Benign prostatic hypertrophy   . Seizure disorder   . OSA (obstructive sleep apnea)     History   Social History  . Marital Status: Single    Spouse Name: N/A    Number of Children: N/A  . Years of Education: N/A   Occupational History  . Not on file.   Social History Main Topics  . Smoking status: Never Smoker   . Smokeless tobacco: Never Used  . Alcohol Use: No  . Drug Use: No  . Sexual Activity: Not on file   Other Topics Concern  . Not on file   Social History Narrative    Past Surgical History  Procedure Laterality Date  . Eye surgery  1967    right eye  . Colonoscopy  2005  . Cardiac catheterization  2011  . Coronary stent placement      Family History  Problem Relation Age of Onset  . Heart failure Father   . Hypertension Mother   . Prostate cancer Brother   . Asthma    . Colon polyps      No Known Allergies  Current Outpatient Prescriptions on File Prior to Visit  Medication Sig Dispense Refill  . amLODipine (NORVASC) 5 MG tablet take 1 tablet by mouth once daily 90 tablet 0  . aspirin 81 MG tablet Take 1 tablet (81 mg total) by mouth daily.    Marland Kitchen atorvastatin (LIPITOR) 40 MG tablet take 1 tablet by mouth once daily 90 tablet 3  . benzonatate (TESSALON) 200 MG capsule Take 1 capsule (200 mg total) by mouth 2 (two) times daily as needed for cough. 30  capsule 0  . clopidogrel (PLAVIX) 75 MG tablet take 1 tablet by mouth once daily 90 tablet 0  . fluticasone (FLONASE) 50 MCG/ACT nasal spray Place 2 sprays into both nostrils daily. 16 g 6  . NITROSTAT 0.4 MG SL tablet place 1 tablet under the tongue every 5 minutes for UP TO 3 doses if needed 25 tablet 3  . telmisartan (MICARDIS) 40 MG tablet take 1 tablet by mouth once daily 90 tablet 0   No current facility-administered medications on file prior to visit.    BP 142/90 mmHg  Pulse 73  Temp(Src) 98.3 F (36.8 C) (Oral)  Resp 20  Ht 5\' 6"  (1.676 m)  Wt 205 lb (92.987 kg)  BMI 33.10 kg/m2  SpO2 97%     Review of Systems  Constitutional: Negative for fever, chills, appetite change and fatigue.  HENT: Negative for congestion, dental problem, ear pain, hearing loss, sore throat, tinnitus, trouble swallowing and voice change.   Eyes: Negative for pain, discharge and visual disturbance.  Respiratory: Positive for cough. Negative for chest tightness, wheezing and stridor.   Cardiovascular: Negative for chest pain, palpitations and leg swelling.  Gastrointestinal: Negative for nausea, vomiting,  abdominal pain, diarrhea, constipation, blood in stool and abdominal distention.  Genitourinary: Negative for urgency, hematuria, flank pain, discharge, difficulty urinating and genital sores.  Musculoskeletal: Negative for myalgias, back pain, joint swelling, arthralgias, gait problem and neck stiffness.  Skin: Negative for rash.  Neurological: Negative for dizziness, syncope, speech difficulty, weakness, numbness and headaches.  Hematological: Negative for adenopathy. Does not bruise/bleed easily.  Psychiatric/Behavioral: Negative for behavioral problems and dysphoric mood. The patient is not nervous/anxious.        Objective:   Physical Exam  Constitutional: He is oriented to person, place, and time. He appears well-developed.  HENT:  Head: Normocephalic.  Right Ear: External ear normal.    Left Ear: External ear normal.  Eyes: Conjunctivae and EOM are normal.  Neck: Normal range of motion.  Cardiovascular: Normal rate and normal heart sounds.   Pulmonary/Chest: Effort normal and breath sounds normal. No respiratory distress. He has no wheezes. He has no rales.  Abdominal: Bowel sounds are normal.  Musculoskeletal: Normal range of motion. He exhibits no edema or tenderness.  Neurological: He is alert and oriented to person, place, and time.  Psychiatric: He has a normal mood and affect. His behavior is normal.          Assessment & Plan:   Chronic cough.  Larger resolved.  Will observe at this point.  Follow chest x-ray normal Hypertension, stable  Recheck 6 months or as needed

## 2013-11-21 NOTE — Progress Notes (Signed)
Pre visit review using our clinic review tool, if applicable. No additional management support is needed unless otherwise documented below in the visit note. 

## 2013-11-21 NOTE — Patient Instructions (Signed)
Limit your sodium (Salt) intake    It is important that you exercise regularly, at least 20 minutes 3 to 4 times per week.  If you develop chest pain or shortness of breath seek  medical attention.  Please check your blood pressure on a regular basis.  If it is consistently greater than 150/90, please make an office appointment.  Return in 6 months for follow-up   

## 2013-11-21 NOTE — Telephone Encounter (Signed)
FYI: Attempted to call pt several times. Lm on vm for pt to cb .Pt needs to change his fup appt to a cpe per donna.

## 2013-12-19 ENCOUNTER — Telehealth: Payer: Self-pay | Admitting: Internal Medicine

## 2013-12-19 MED ORDER — AMLODIPINE BESYLATE 5 MG PO TABS: 5.0000 mg | ORAL_TABLET | Freq: Every day | ORAL | Status: DC

## 2013-12-19 NOTE — Telephone Encounter (Signed)
Pt notified Rx sent to pharmacy

## 2013-12-19 NOTE — Telephone Encounter (Signed)
Pt needs refill on amlodipine 5 mg #90 w/refills sent to [facility] [address]

## 2014-01-13 ENCOUNTER — Other Ambulatory Visit: Payer: Self-pay | Admitting: Internal Medicine

## 2014-02-01 ENCOUNTER — Other Ambulatory Visit: Payer: Self-pay | Admitting: Internal Medicine

## 2014-02-06 ENCOUNTER — Other Ambulatory Visit: Payer: Self-pay | Admitting: Internal Medicine

## 2014-04-09 ENCOUNTER — Other Ambulatory Visit: Payer: Self-pay | Admitting: Internal Medicine

## 2014-05-15 ENCOUNTER — Other Ambulatory Visit: Payer: BC Managed Care – PPO

## 2014-05-16 ENCOUNTER — Other Ambulatory Visit (INDEPENDENT_AMBULATORY_CARE_PROVIDER_SITE_OTHER): Payer: BC Managed Care – PPO

## 2014-05-16 DIAGNOSIS — Z0000 Encounter for general adult medical examination without abnormal findings: Secondary | ICD-10-CM

## 2014-05-16 LAB — HEPATIC FUNCTION PANEL
ALT: 23 U/L (ref 0–53)
AST: 18 U/L (ref 0–37)
Albumin: 3.9 g/dL (ref 3.5–5.2)
Alkaline Phosphatase: 85 U/L (ref 39–117)
Bilirubin, Direct: 0.1 mg/dL (ref 0.0–0.3)
Total Bilirubin: 0.4 mg/dL (ref 0.2–1.2)
Total Protein: 6.9 g/dL (ref 6.0–8.3)

## 2014-05-16 LAB — CBC WITH DIFFERENTIAL/PLATELET
Basophils Absolute: 0 10*3/uL (ref 0.0–0.1)
Basophils Relative: 0.6 % (ref 0.0–3.0)
Eosinophils Absolute: 0.1 10*3/uL (ref 0.0–0.7)
Eosinophils Relative: 1.6 % (ref 0.0–5.0)
HCT: 42.7 % (ref 39.0–52.0)
Hemoglobin: 14.6 g/dL (ref 13.0–17.0)
Lymphocytes Relative: 24.9 % (ref 12.0–46.0)
Lymphs Abs: 1.6 10*3/uL (ref 0.7–4.0)
MCHC: 34.3 g/dL (ref 30.0–36.0)
MCV: 84 fl (ref 78.0–100.0)
Monocytes Absolute: 0.5 10*3/uL (ref 0.1–1.0)
Monocytes Relative: 8 % (ref 3.0–12.0)
Neutro Abs: 4.2 10*3/uL (ref 1.4–7.7)
Neutrophils Relative %: 64.9 % (ref 43.0–77.0)
Platelets: 246 10*3/uL (ref 150.0–400.0)
RBC: 5.09 Mil/uL (ref 4.22–5.81)
RDW: 14.6 % (ref 11.5–15.5)
WBC: 6.5 10*3/uL (ref 4.0–10.5)

## 2014-05-16 LAB — LIPID PANEL
Cholesterol: 139 mg/dL (ref 0–200)
HDL: 33.7 mg/dL — ABNORMAL LOW (ref >39.00)
LDL Cholesterol: 89 mg/dL (ref 0–99)
NonHDL: 105.3
Total CHOL/HDL Ratio: 4
Triglycerides: 83 mg/dL (ref 0.0–149.0)
VLDL: 16.6 mg/dL (ref 0.0–40.0)

## 2014-05-16 LAB — BASIC METABOLIC PANEL
BUN: 11 mg/dL (ref 6–23)
CO2: 29 mEq/L (ref 19–32)
Calcium: 9.5 mg/dL (ref 8.4–10.5)
Chloride: 104 mEq/L (ref 96–112)
Creatinine, Ser: 1.02 mg/dL (ref 0.40–1.50)
GFR: 94.64 mL/min (ref >60.00)
Glucose, Bld: 84 mg/dL (ref 70–99)
Potassium: 4.6 mEq/L (ref 3.5–5.1)
Sodium: 137 mEq/L (ref 135–145)

## 2014-05-16 LAB — PSA: PSA: 3.76 ng/mL (ref 0.10–4.00)

## 2014-05-16 LAB — TSH: TSH: 0.57 u[IU]/mL (ref 0.35–4.50)

## 2014-05-22 ENCOUNTER — Ambulatory Visit: Payer: BC Managed Care – PPO | Admitting: Internal Medicine

## 2014-05-22 ENCOUNTER — Encounter: Payer: Self-pay | Admitting: Internal Medicine

## 2014-05-22 ENCOUNTER — Ambulatory Visit (INDEPENDENT_AMBULATORY_CARE_PROVIDER_SITE_OTHER): Payer: BC Managed Care – PPO | Admitting: Internal Medicine

## 2014-05-22 VITALS — BP 130/76 | HR 79 | Temp 97.9°F | Resp 20 | Ht 65.25 in | Wt 200.0 lb

## 2014-05-22 DIAGNOSIS — Z0000 Encounter for general adult medical examination without abnormal findings: Secondary | ICD-10-CM | POA: Diagnosis not present

## 2014-05-22 DIAGNOSIS — E785 Hyperlipidemia, unspecified: Secondary | ICD-10-CM

## 2014-05-22 DIAGNOSIS — I2510 Atherosclerotic heart disease of native coronary artery without angina pectoris: Secondary | ICD-10-CM

## 2014-05-22 MED ORDER — ATORVASTATIN CALCIUM 40 MG PO TABS: ORAL_TABLET | ORAL | Status: DC

## 2014-05-22 MED ORDER — VALSARTAN 160 MG PO TABS: 160.0000 mg | ORAL_TABLET | Freq: Every day | ORAL | Status: DC

## 2014-05-22 NOTE — Patient Instructions (Signed)
Cardiology and GI consultation as discussed  Limit your sodium (Salt) intake    It is important that you exercise regularly, at least 20 minutes 3 to 4 times per week.  If you develop chest pain or shortness of breath seek  medical attention.  Health Maintenance A healthy lifestyle and preventative care can promote health and wellness.  Maintain regular health, dental, and eye exams.  Eat a healthy diet. Foods like vegetables, fruits, whole grains, low-fat dairy products, and lean protein foods contain the nutrients you need and are low in calories. Decrease your intake of foods high in solid fats, added sugars, and salt. Get information about a proper diet from your health care provider, if necessary.  Regular physical exercise is one of the most important things you can do for your health. Most adults should get at least 150 minutes of moderate-intensity exercise (any activity that increases your heart rate and causes you to sweat) each week. In addition, most adults need muscle-strengthening exercises on 2 or more days a week.   Maintain a healthy weight. The body mass index (BMI) is a screening tool to identify possible weight problems. It provides an estimate of body fat based on height and weight. Your health care provider can find your BMI and can help you achieve or maintain a healthy weight. For males 20 years and older:  A BMI below 18.5 is considered underweight.  A BMI of 18.5 to 24.9 is normal.  A BMI of 25 to 29.9 is considered overweight.  A BMI of 30 and above is considered obese.  Maintain normal blood lipids and cholesterol by exercising and minimizing your intake of saturated fat. Eat a balanced diet with plenty of fruits and vegetables. Blood tests for lipids and cholesterol should begin at age 82 and be repeated every 5 years. If your lipid or cholesterol levels are high, you are over age 52, or you are at high risk for heart disease, you may need your cholesterol  levels checked more frequently.Ongoing high lipid and cholesterol levels should be treated with medicines if diet and exercise are not working.  If you smoke, find out from your health care provider how to quit. If you do not use tobacco, do not start.  Lung cancer screening is recommended for adults aged 62-80 years who are at high risk for developing lung cancer because of a history of smoking. A yearly low-dose CT scan of the lungs is recommended for people who have at least a 30-pack-year history of smoking and are current smokers or have quit within the past 15 years. A pack year of smoking is smoking an average of 1 pack of cigarettes a day for 1 year (for example, a 30-pack-year history of smoking could mean smoking 1 pack a day for 30 years or 2 packs a day for 15 years). Yearly screening should continue until the smoker has stopped smoking for at least 15 years. Yearly screening should be stopped for people who develop a health problem that would prevent them from having lung cancer treatment.  If you choose to drink alcohol, do not have more than 2 drinks per day. One drink is considered to be 12 oz (360 mL) of beer, 5 oz (150 mL) of wine, or 1.5 oz (45 mL) of liquor.  Avoid the use of street drugs. Do not share needles with anyone. Ask for help if you need support or instructions about stopping the use of drugs.  High blood pressure causes heart  disease and increases the risk of stroke. Blood pressure should be checked at least every 1-2 years. Ongoing high blood pressure should be treated with medicines if weight loss and exercise are not effective.  If you are 26-36 years old, ask your health care provider if you should take aspirin to prevent heart disease.  Diabetes screening involves taking a blood sample to check your fasting blood sugar level. This should be done once every 3 years after age 29 if you are at a normal weight and without risk factors for diabetes. Testing should be  considered at a younger age or be carried out more frequently if you are overweight and have at least 1 risk factor for diabetes.  Colorectal cancer can be detected and often prevented. Most routine colorectal cancer screening begins at the age of 33 and continues through age 9. However, your health care provider may recommend screening at an earlier age if you have risk factors for colon cancer. On a yearly basis, your health care provider may provide home test kits to check for hidden blood in the stool. A small camera at the end of a tube may be used to directly examine the colon (sigmoidoscopy or colonoscopy) to detect the earliest forms of colorectal cancer. Talk to your health care provider about this at age 42 when routine screening begins. A direct exam of the colon should be repeated every 5-10 years through age 29, unless early forms of precancerous polyps or small growths are found.  People who are at an increased risk for hepatitis B should be screened for this virus. You are considered at high risk for hepatitis B if:  You were born in a country where hepatitis B occurs often. Talk with your health care provider about which countries are considered high risk.  Your parents were born in a high-risk country and you have not received a shot to protect against hepatitis B (hepatitis B vaccine).  You have HIV or AIDS.  You use needles to inject street drugs.  You live with, or have sex with, someone who has hepatitis B.  You are a man who has sex with other men (MSM).  You get hemodialysis treatment.  You take certain medicines for conditions like cancer, organ transplantation, and autoimmune conditions.  Hepatitis C blood testing is recommended for all people born from [birth year] through 1965 and any individual with known risk factors for hepatitis C.  Healthy men should no longer receive prostate-specific antigen (PSA) blood tests as part of routine cancer screening. Talk to your health  care provider about prostate cancer screening.  Testicular cancer screening is not recommended for adolescents or adult males who have no symptoms. Screening includes self-exam, a health care provider exam, and other screening tests. Consult with your health care provider about any symptoms you have or any concerns you have about testicular cancer.  Practice safe sex. Use condoms and avoid high-risk sexual practices to reduce the spread of sexually transmitted infections (STIs).  You should be screened for STIs, including gonorrhea and chlamydia if:  You are sexually active and are younger than 24 years.  You are older than 24 years, and your health care provider tells you that you are at risk for this type of infection.  Your sexual activity has changed since you were last screened, and you are at an increased risk for chlamydia or gonorrhea. Ask your health care provider if you are at risk.  If you are at risk of being infected  with HIV, it is recommended that you take a prescription medicine daily to prevent HIV infection. This is called pre-exposure prophylaxis (PrEP). You are considered at risk if:  You are a man who has sex with other men (MSM).  You are a heterosexual man who is sexually active with multiple partners.  You take drugs by injection.  You are sexually active with a partner who has HIV.  Talk with your health care provider about whether you are at high risk of being infected with HIV. If you choose to begin PrEP, you should first be tested for HIV. You should then be tested every 3 months for as long as you are taking PrEP.  Use sunscreen. Apply sunscreen liberally and repeatedly throughout the day. You should seek shade when your shadow is shorter than you. Protect yourself by wearing long sleeves, pants, a wide-brimmed hat, and sunglasses year round whenever you are outdoors.  Tell your health care provider of new moles or changes in moles, especially if there is a  change in shape or color. Also, tell your health care provider if a mole is larger than the size of a pencil eraser.  A one-time screening for abdominal aortic aneurysm (AAA) and surgical repair of large AAAs by ultrasound is recommended for men aged 59-75 years who are current or former smokers.  Stay current with your vaccines (immunizations). Document Released: 06/18/2007 Document Revised: 12/25/2012 Document Reviewed: 05/17/2010 [facility] Patient Information 2015 [place], [place]. This information is not intended to replace advice given to you by your health care provider. Make sure you discuss any questions you have with your health care provider. Cardiac Diet A cardiac diet can help stop heart disease or a stroke from happening. It involves eating less unhealthy fats and eating more healthy fats.  FOODS TO AVOID OR LIMIT  Limit saturated fats. This type of fat is found in oils and dairy products, such as:  Coconut oil.  Palm oil.  Cocoa butter.  Butter.  Avoid trans-fat or hydrogenated oils. These are found in fried or pre-made baked goods, such as:  Margarine.  Pre-made cookies, cakes, and crackers.  Limit processed meats (hot dogs, deli meats, sausage) to 3 ounces a week.  Limit high-fat meats (marbled meats, fried chicken, or chicken with skin) to 3 ounces a week.  Limit salt (sodium) to 1500 milligrams a day.   Limit sweets and drinks with added sugar to no more than 5 servings a week. One serving is:  1 tablespoon of sugar.  1 tablespoon of jelly or jam.   cup sorbet.  1 cup lemonade.   cup regular soda. EAT MORE OF THE FOLLOWING FOODS Fruit  Eat 4to 5 servings a day. One serving of fruit is:  1 medium whole fruit.   cup dried fruit.   cup of fresh, frozen, or canned fruit.   cup 100% fruit juice. Vegetables  Eat 4 to 5 servings a day. One serving is:  1 cup raw leafy vegetables.   cup raw or cooked, cut-up vegetables.   cup vegetable  juice. Whole Grains  Eat 3 servings a day (1 ounce equals 1 serving). Legumes (such as beans, peas, and lentils)   Eat at least 4 servings a week ( cup equals 1 serving). Nuts and Seeds   Eat at least 4 servings a week ( cup equals 1 serving). Dietary Fiber  Eat 20 to 30 grams a day. Some foods high in dietary fiber include:  Dried beans.  Citrus  fruits.  Apples, bananas.  Broccoli, Brussels sprouts, and eggplant.  Oats. Omega-3 Fats  Eat food with omega-3 fats. You can also take a dietary pill (supplement) that has 1 gram of DHA and EPA. Have 3.5 ounces of fatty fish a week, such as:  Salmon.  Mackerel.  [place] tuna.  Sardines.  Lake trout.  Herring. PREPARING YOUR FOOD  Broil, bake, steam, or roast foods. Do not fry food. Do not cook food in butter (fat).  Use non-stick cooking sprays.  Remove skin from poultry, such as chicken and [place].  Remove fat from meat.  Take the fat off the top of stews, soups, and gravy.  Use lemon or herbs to flavor food instead of using butter or margarine.  Use nonfat yogurt, salsa, or low-fat dressings for salads. Document Released: 06/21/2011 Document Reviewed: 06/21/2011 [facility] Patient Information 2015 [place]. This information is not intended to replace advice given to you by your health care provider. Make sure you discuss any questions you have with your health care provider.
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
## 2014-05-22 NOTE — Progress Notes (Signed)
Pre visit review using our clinic review tool, if applicable. No additional management support is needed unless otherwise documented below in the visit note. 

## 2014-05-23 ENCOUNTER — Encounter: Payer: Self-pay | Admitting: Gastroenterology

## 2014-07-10 ENCOUNTER — Encounter: Payer: Self-pay | Admitting: Internal Medicine

## 2014-07-10 ENCOUNTER — Ambulatory Visit (INDEPENDENT_AMBULATORY_CARE_PROVIDER_SITE_OTHER): Payer: BC Managed Care – PPO | Admitting: Internal Medicine

## 2014-07-10 VITALS — BP 110/78 | HR 62 | Ht 66.0 in | Wt 199.4 lb

## 2014-07-10 DIAGNOSIS — R0602 Shortness of breath: Secondary | ICD-10-CM | POA: Diagnosis not present

## 2014-07-10 DIAGNOSIS — R9431 Abnormal electrocardiogram [ECG] [EKG]: Secondary | ICD-10-CM | POA: Diagnosis not present

## 2014-07-10 NOTE — Patient Instructions (Signed)
Your physician recommends that you continue on your current medications as directed. Please refer to the Current Medication list given to you today.  Your physician wants you to follow-up in: [place].  You will receive a reminder letter in the mail two months in advance. If you don't receive a letter, please call our office to schedule the follow-up appointment.  Your physician has requested that you have en exercise stress myoview. For further information please visit [email or web address]. Please follow instruction sheet, as given.  Your physician has requested that you have an echocardiogram. Echocardiography is a painless test that uses sound waves to create images of your heart. It provides your doctor with information about the size and shape of your heart and how well your heart's chambers and valves are working. This procedure takes approximately one hour. There are no restrictions for this procedure.

## 2014-07-10 NOTE — Progress Notes (Signed)
Cardiology Office Note   Date:  07/10/2014   ID:  Goebel, Hellums 09/07/1950, MRN [id number]  PCP:  Nyoka Cowden, MD  Cardiologist:   [name], MD   No chief complaint on file.     History of Present Illness: Manuel Barker is a 64 y.o. male with a history of CAD. Cardiac ath in 2011 was done that showed: LMNormal; LAD: 90% apical; 70% D1, : LCx: 95% proximal; 99% mid. The paitent underwent PTCA/DES x3 to the LCx.  I saw the pt in October 2014.    Lipids in May LDL was 89  HDL was 34.    Sometimes when has to walk to care has sl chest discomfort in chest  Not all the time   No SOB  Deos not slow down NO PND   NO presyncope     Current Outpatient Prescriptions  Medication Sig Dispense Refill  . amLODipine (NORVASC) 5 MG tablet Take 1 tablet (5 mg total) by mouth daily. 90 tablet 1  . aspirin 81 MG tablet Take 1 tablet (81 mg total) by mouth daily.    Marland Kitchen atorvastatin (LIPITOR) 40 MG tablet take 1 tablet by mouth once daily 90 tablet 1  . clopidogrel (PLAVIX) 75 MG tablet take 1 tablet by mouth once daily 90 tablet 1  . fluticasone (FLONASE) 50 MCG/ACT nasal spray Place 2 sprays into both nostrils daily. 16 g 6  . NITROSTAT 0.4 MG SL tablet PLACE 1 TABLET UNDER THE TONGUE EVERY 5 MINUTES UP TO 3 DOSES IF NEEDED 25 tablet 3  . telmisartan (MICARDIS) 40 MG tablet Take 40 mg by mouth daily.  0   No current facility-administered medications for this visit.    Allergies:   Review of patient's allergies indicates no known allergies.   Past Medical History  Diagnosis Date  . CAD (coronary artery disease)   . Hypertension   . Benign prostatic hypertrophy   . Seizure disorder   . OSA (obstructive sleep apnea)     Past Surgical History  Procedure Laterality Date  . Eye surgery  1967    right eye  . Colonoscopy  2005  . Cardiac catheterization  2011  . Coronary stent placement       Social History:  The patient  reports that he has never smoked. He has  never used smokeless tobacco. He reports that he does not drink alcohol or use illicit drugs.   Family History:  The patient's family history includes Asthma in an other family member; Colon polyps in an other family member; Heart failure in his father; Hypertension in his mother; Prostate cancer in his brother.    ROS:  Please see the history of present illness. All other systems are reviewed and  Negative to the above problem except as noted.    PHYSICAL EXAM: VS:  BP 110/78 mmHg  Pulse 62  Ht 5\' 6"  (1.676 m)  Wt 199 lb 6.4 oz (90.447 kg)  BMI 32.20 kg/m2  GEN: Well nourished, well developed, in no acute distress HEENT: normal Neck: no JVD, carotid bruits, or masses Cardiac: RRR; no murmurs, rubs, or gallops,no edema  Respiratory:  clear to auscultation bilaterally, normal work of breathing GI: soft, nontender, nondistended, + BS  No hepatomegaly  MS: no deformity Moving all extremities   Skin: warm and dry, no rash Neuro:  Strength and sensation are intact Psych: euthymic mood, full affect   EKG:  EKG is ordered today.  SR 62  bpm   T wave inveriosn I, II, III, AVF, V2 to V6, AVL)   Lipid Panel    Component Value Date/Time   CHOL 139 05/16/2014 0939   TRIG 83.0 05/16/2014 0939   HDL 33.70* 05/16/2014 0939   CHOLHDL 4 05/16/2014 0939   VLDL 16.6 05/16/2014 0939   LDLCALC 89 05/16/2014 0939   LDLDIRECT 179.1 08/21/2007 0946      Wt Readings from Last 3 Encounters:  07/10/14 199 lb 6.4 oz (90.447 kg)  05/22/14 200 lb (90.719 kg)  11/21/13 205 lb (92.987 kg)      ASSESSMENT AND PLAN:  1.  CAD   Patient has some concerning symptoms  I would recomm an echo to evaluate LV thickness, LV function  I would also set up for stressmyoview.  2.  HL  Keep on statin   3.  HTN  Adequate control.  F/U will be based on test results AT least would f/u in 1 year.    Signed, [name], MD  07/10/2014 12:00 PM    [address],  [place], [place]  [address] Phone: [phone]; Fax: [phone]

## 2014-07-12 ENCOUNTER — Other Ambulatory Visit: Payer: Self-pay | Admitting: Internal Medicine

## 2014-07-14 ENCOUNTER — Telehealth (HOSPITAL_COMMUNITY): Payer: Self-pay

## 2014-07-14 NOTE — Telephone Encounter (Signed)
Left message on voicemail in reference to upcoming appointment scheduled for 07-16-2014. Phone number given for a call back so details instructions can be given. Jianna Drabik A   

## 2014-07-14 NOTE — Telephone Encounter (Signed)
Patient given detailed instructions per Myocardial Perfusion Study Information Sheet for test on 07-16-2014 at 12:30, arrive at 11:15am for Echo. Patient Notified to arrive 15 minutes early, and that it is imperative to arrive on time for appointment to keep from having the test rescheduled. Patient verbalized understanding. Manuel Barker, Mitsuo Budnick A

## 2014-07-16 ENCOUNTER — Ambulatory Visit (HOSPITAL_BASED_OUTPATIENT_CLINIC_OR_DEPARTMENT_OTHER): Payer: BC Managed Care – PPO

## 2014-07-16 ENCOUNTER — Other Ambulatory Visit: Payer: Self-pay

## 2014-07-16 ENCOUNTER — Ambulatory Visit (HOSPITAL_COMMUNITY): Payer: BC Managed Care – PPO | Attending: Internal Medicine

## 2014-07-16 DIAGNOSIS — R9431 Abnormal electrocardiogram [ECG] [EKG]: Secondary | ICD-10-CM | POA: Diagnosis not present

## 2014-07-16 DIAGNOSIS — I358 Other nonrheumatic aortic valve disorders: Secondary | ICD-10-CM | POA: Diagnosis not present

## 2014-07-16 DIAGNOSIS — R9439 Abnormal result of other cardiovascular function study: Secondary | ICD-10-CM | POA: Diagnosis not present

## 2014-07-16 DIAGNOSIS — R0602 Shortness of breath: Secondary | ICD-10-CM | POA: Diagnosis not present

## 2014-07-16 DIAGNOSIS — I340 Nonrheumatic mitral (valve) insufficiency: Secondary | ICD-10-CM | POA: Diagnosis not present

## 2014-07-16 LAB — MYOCARDIAL PERFUSION IMAGING
Estimated workload: 10.7 METS
Exercise duration (min): 10 min
Exercise duration (sec): 0 s
MPHR: 157 {beats}/min
Peak HR: 146 {beats}/min
Percent HR: 93 %
RPE: 18
Rest HR: 62 {beats}/min

## 2014-07-16 MED ORDER — TECHNETIUM TC 99M SESTAMIBI GENERIC - CARDIOLITE
10.3000 | Freq: Once | INTRAVENOUS | Status: AC | PRN
  Administered 2014-07-16: 10 via INTRAVENOUS

## 2014-07-16 MED ORDER — TECHNETIUM TC 99M SESTAMIBI GENERIC - CARDIOLITE
30.3000 | Freq: Once | INTRAVENOUS | Status: AC | PRN
  Administered 2014-07-16: 30.3 via INTRAVENOUS

## 2014-07-24 ENCOUNTER — Ambulatory Visit (INDEPENDENT_AMBULATORY_CARE_PROVIDER_SITE_OTHER): Payer: BC Managed Care – PPO | Admitting: Gastroenterology

## 2014-07-24 ENCOUNTER — Encounter: Payer: Self-pay | Admitting: Gastroenterology

## 2014-07-24 ENCOUNTER — Telehealth: Payer: Self-pay

## 2014-07-24 VITALS — BP 100/64 | HR 76 | Ht 65.5 in | Wt 198.0 lb

## 2014-07-24 DIAGNOSIS — K921 Melena: Secondary | ICD-10-CM

## 2014-07-24 DIAGNOSIS — R1012 Left upper quadrant pain: Secondary | ICD-10-CM | POA: Diagnosis not present

## 2014-07-24 DIAGNOSIS — I2510 Atherosclerotic heart disease of native coronary artery without angina pectoris: Secondary | ICD-10-CM | POA: Diagnosis not present

## 2014-07-24 MED ORDER — NA SULFATE-K SULFATE-MG SULF 17.5-3.13-1.6 GM/177ML PO SOLN: 1.0000 | Freq: Once | ORAL | Status: DC

## 2014-07-24 NOTE — Patient Instructions (Signed)
You have been scheduled for an endoscopy and colonoscopy. Please follow the written instructions given to you at your visit today. Please pick up your prep supplies at the pharmacy within the next 1-3 days. If you use inhalers (even only as needed), please bring them with you on the day of your procedure. Your physician has requested that you go to www.startemmi.com and enter the access code given to you at your visit today. This web site gives a general overview about your procedure. However, you should still follow specific instructions given to you by our office regarding your preparation for the procedure.  Normal BMI (Body Mass Index- based on height and weight) is between 19 and 25. Your BMI today is Body mass index is 32.44 kg/(m^2). Marland Kitchen Please consider follow up  regarding your BMI with your Primary Care Provider.  Thank you for choosing me and [facility].  Pricilla Riffle. Dagoberto Ligas., MD., Marval Regal  cc: Bluford Kaufmann, MD

## 2014-07-24 NOTE — Progress Notes (Signed)
    History of Present Illness: This is a 64 year old male referred by Marletta Lor, MD for the evaluation of rectal bleeding and left upper quadrant pain. He is accompanied by his wife. Patient relates frequent postprandial left upper quadrant discomfort that lasts for couple hours. This happens after large meals but not small meals. These symptoms have been present for several years and have not changed. He relates intermittent small-volume bright red rectal bleeding for the past 5 years that he attributes to high fiber foods. When he avoids high fiber foods he does not note bleeding. He tends to have a bowel movement after meals and this bowel pattern has not changed for years. He previously underwent colonoscopy by Dr. Sharlett Iles in 2005 and that was normal. Denies weight loss, constipation, diarrhea, change in stool caliber, melena, nausea, vomiting, dysphagia, reflux symptoms, chest pain.  Review of Systems: Pertinent positive and negative review of systems were noted in the above HPI section. All other review of systems were otherwise negative.  Current Medications, Allergies, Past Medical History, Past Surgical History, Family History and Social History were reviewed in [employer] record.  Physical Exam: General: Well developed, well nourished, no acute distress Head: Normocephalic and atraumatic Eyes:  sclerae anicteric, EOMI Ears: Normal auditory acuity Mouth: No deformity or lesions Neck: Supple, no masses or thyromegaly Lungs: Clear throughout to auscultation Heart: Regular rate and rhythm; no murmurs, rubs or bruits Abdomen: Soft, non tender and non distended. No masses, hepatosplenomegaly or hernias noted. Normal Bowel sounds Rectal: Deferred to colonoscopy Musculoskeletal: Symmetrical with no gross deformities  Skin: No lesions on visible extremities Pulses:  Normal pulses noted Extremities: No clubbing, cyanosis, edema or deformities  noted Neurological: Alert oriented x 4, grossly nonfocal Cervical Nodes:  No significant cervical adenopathy Inguinal Nodes: No significant inguinal adenopathy Psychological:  Alert and cooperative. Normal mood and affect  Assessment and Recommendations:  1. Hematochezia, small volume and intermittent. Presumed benign anorectal source. Rule out colorectal neoplasms. Schedule colonoscopy. The risks (including bleeding, perforation, infection, missed lesions, medication reactions and possible hospitalization or surgery if complications occur), benefits, and alternatives to colonoscopy with possible biopsy and possible polypectomy were discussed with the patient and they consent to proceed.   2. Mild postprandial left upper quadrant pain. Rule out gastritis, ulcer and other disorders. Schedule EGD. The risks (including bleeding, perforation, infection, missed lesions, medication reactions and possible hospitalization or surgery if complications occur), benefits, and alternatives to endoscopy with possible biopsy and possible dilation were discussed with the patient and they consent to proceed.   3. CAD with stent. Hold Plavix 5 days before procedure - will instruct when and how to resume after procedure. Rare but real risk of cardiovascular event such as heart attack or ischemia/infarct of other organs off Plavix explained and need to seek urgent help if this occurs. Will communicate by phone or EMR with patient's prescribing provider that to confirm holding Plavix is reasonable in this case.    cc: Marletta Lor, MD [address] [place], [place] [address]

## 2014-07-24 NOTE — Telephone Encounter (Signed)
  07/24/2014   RE: Manuel Barker DOB: 12-Oct-1950 MRN: [id number]   Dear Dr. Harrington Challenger ,    We have scheduled the above patient for an endoscopic procedure. Our records show that he is on anticoagulation therapy.   Please advise as to how long the patient may come off his therapy of Plavix prior to the procedure, which is scheduled for 10/02/14.  Please route your answer to Marlon Pel, [place].   Sincerely,    Marlon Pel, CMA

## 2014-07-24 NOTE — Telephone Encounter (Signed)
OK to stop plavix 5 days prior to procedure  Resume after

## 2014-07-25 NOTE — Telephone Encounter (Signed)
Notified patient to stop Plavix 5 days prior to his procedure. Pt verbalized understanding.

## 2014-08-05 ENCOUNTER — Encounter: Payer: Self-pay | Admitting: Internal Medicine

## 2014-08-05 ENCOUNTER — Ambulatory Visit (INDEPENDENT_AMBULATORY_CARE_PROVIDER_SITE_OTHER): Payer: BC Managed Care – PPO | Admitting: Internal Medicine

## 2014-08-05 VITALS — BP 140/90 | HR 72 | Temp 97.9°F | Resp 20 | Ht 65.5 in | Wt 197.0 lb

## 2014-08-05 DIAGNOSIS — I10 Essential (primary) hypertension: Secondary | ICD-10-CM | POA: Diagnosis not present

## 2014-08-05 NOTE — Patient Instructions (Signed)
You  may move around, but avoid painful motions and activities.  Apply heat to the sore area for 15 to 20 minutes 3 or 4 times daily for the next two to 3 days.   Back Injury Prevention Back injuries can be extremely painful and difficult to heal. After having one back injury, you are much more likely to experience another later on. It is important to learn how to avoid injuring or re-injuring your back. The following tips can help you to prevent a back injury. PHYSICAL FITNESS  Exercise regularly and try to develop good tone in your abdominal muscles. Your abdominal muscles provide a lot of the support needed by your back.  Do aerobic exercises (walking, jogging, biking, swimming) regularly.  Do exercises that increase balance and strength (tai chi, yoga) regularly. This can decrease your risk of falling and injuring your back.  Stretch before and after exercising.  Maintain a healthy weight. The more you weigh, the more stress is placed on your back. For every pound of weight, 10 times that amount of pressure is placed on the back. DIET  Talk to your caregiver about how much calcium and vitamin D you need per day. These nutrients help to prevent weakening of the bones (osteoporosis). Osteoporosis can cause broken (fractured) bones that lead to back pain.  Include good sources of calcium in your diet, such as dairy products, green, leafy vegetables, and products with calcium added (fortified).  Include good sources of vitamin D in your diet, such as milk and foods that are fortified with vitamin D.  Consider taking a nutritional supplement or a multivitamin if needed.  Stop smoking if you smoke. POSTURE  Sit and stand up straight. Avoid leaning forward when you sit or hunching over when you stand.  Choose chairs with good low back (lumbar) support.  If you work at a desk, sit close to your work so you do not need to lean over. Keep your chin tucked in. Keep your neck drawn back and  elbows bent at a right angle. Your arms should look like the letter "L."  Sit high and close to the steering wheel when you drive. Add a lumbar support to your car seat if needed.  Avoid sitting or standing in one position for too long. Take breaks to get up, stretch, and walk around at least once every hour. Take breaks if you are driving for long periods of time.  Sleep on your side with your knees slightly bent, or sleep on your back with a pillow under your knees. Do not sleep on your stomach. LIFTING, TWISTING, AND REACHING  Avoid heavy lifting, especially repetitive lifting. If you must do heavy lifting:  Stretch before lifting.  Work slowly.  Rest between lifts.  Use carts and dollies to move objects when possible.  Make several small trips instead of carrying 1 heavy load.  Ask for help when you need it.  Ask for help when moving big, awkward objects.  Follow these steps when lifting:  Stand with your feet shoulder-width apart.  Get as close to the object as you can. Do not try to pick up heavy objects that are far from your body.  Use handles or lifting straps if they are available.  Bend at your knees. Squat down, but keep your heels off the floor.  Keep your shoulders pulled back, your chin tucked in, and your back straight.  Lift the object slowly, tightening the muscles in your legs, abdomen, and buttocks.  Keep the object as close to the center of your body as possible.  When you put a load down, use these same guidelines in reverse.  Do not:  Lift the object above your waist.  Twist at the waist while lifting or carrying a load. Move your feet if you need to turn, not your waist.  Bend over without bending at your knees.  Avoid reaching over your head, across a table, or for an object on a high surface. OTHER TIPS  Avoid wet floors and keep sidewalks clear of ice to prevent falls.  Do not sleep on a mattress that is too soft or too hard.  Keep  items that are used frequently within easy reach.  Put heavier objects on shelves at waist level and lighter objects on lower or higher shelves.  Find ways to decrease your stress, such as exercise, massage, or relaxation techniques. Stress can build up in your muscles. Tense muscles are more vulnerable to injury.  Seek treatment for depression or anxiety if needed. These conditions can increase your risk of developing back pain. SEEK MEDICAL CARE IF:  You injure your back.  You have questions about diet, exercise, or other ways to prevent back injuries. MAKE SURE YOU:  Understand these instructions.  Will watch your condition.  Will get help right away if you are not doing well or get worse. Document Released: 01/28/2004 Document Revised: 03/14/2011 Document Reviewed: 01/31/2011 [facility] Patient Information 2015 [place], [place]. This information is not intended to replace advice given to you by your health care provider. Make sure you discuss any questions you have with your health care provider. Lumbosacral Strain Lumbosacral strain is a strain of any of the parts that make up your lumbosacral vertebrae. Your lumbosacral vertebrae are the bones that make up the lower third of your backbone. Your lumbosacral vertebrae are held together by muscles and tough, fibrous tissue (ligaments).  CAUSES  A sudden blow to your back can cause lumbosacral strain. Also, anything that causes an excessive stretch of the muscles in the low back can cause this strain. This is typically seen when people exert themselves strenuously, fall, lift heavy objects, bend, or crouch repeatedly. RISK FACTORS  Physically demanding work.  Participation in pushing or pulling sports or sports that require a sudden twist of the back (tennis, golf, baseball).  Weight lifting.  Excessive lower back curvature.  Forward-tilted pelvis.  Weak back or abdominal muscles or both.  Tight hamstrings. SIGNS AND SYMPTOMS    Lumbosacral strain may cause pain in the area of your injury or pain that moves (radiates) down your leg.  DIAGNOSIS Your health care provider can often diagnose lumbosacral strain through a physical exam. In some cases, you may need tests such as X-ray exams.  TREATMENT  Treatment for your lower back injury depends on many factors that your clinician will have to evaluate. However, most treatment will include the use of anti-inflammatory medicines. HOME CARE INSTRUCTIONS   Avoid hard physical activities (tennis, racquetball, waterskiing) if you are not in proper physical condition for it. This may aggravate or create problems.  If you have a back problem, avoid sports requiring sudden body movements. Swimming and walking are generally safer activities.  Maintain good posture.  Maintain a healthy weight.  For acute conditions, you may put ice on the injured area.  Put ice in a plastic bag.  Place a towel between your skin and the bag.  Leave the ice on for 20 minutes, 2-3 times  a day.  When the low back starts healing, stretching and strengthening exercises may be recommended. SEEK MEDICAL CARE IF:  Your back pain is getting worse.  You experience severe back pain not relieved with medicines. SEEK IMMEDIATE MEDICAL CARE IF:   You have numbness, tingling, weakness, or problems with the use of your arms or legs.  There is a change in bowel or bladder control.  You have increasing pain in any area of the body, including your belly (abdomen).  You notice shortness of breath, dizziness, or feel faint.  You feel sick to your stomach (nauseous), are throwing up (vomiting), or become sweaty.  You notice discoloration of your toes or legs, or your feet get very cold. MAKE SURE YOU:   Understand these instructions.  Will watch your condition.  Will get help right away if you are not doing well or get worse. Document Released: 09/29/2004 Document Revised: 12/25/2012 Document  Reviewed: 08/08/2012 [facility] Patient Information 2015 [place], [place]. This information is not intended to replace advice given to you by your health care provider. Make sure you discuss any questions you have with your health care provider.

## 2014-08-05 NOTE — Progress Notes (Signed)
Pre visit review using our clinic review tool, if applicable. No additional management support is needed unless otherwise documented below in the visit note. 

## 2014-08-05 NOTE — Progress Notes (Signed)
Subjective:    Patient ID: Manuel Barker, male    DOB: 11/20/1950, 64 y.o.   MRN: [id number]  HPI  64 year old patient who has a history of essential hypertension and coronary artery disease.  No prior history of back pain. Last week he was doing repetitive heavy lifting and had the onset of some left lumbar back discomfort.  He describes this as a burning quality, but no radiation down the leg.  Denies any weakness. Today he feels much improved.  Pain is aggravated by bending at the waist.  Past Medical History  Diagnosis Date  . CAD (coronary artery disease)   . Hypertension   . Benign prostatic hypertrophy   . Seizure disorder   . OSA (obstructive sleep apnea)     History   Social History  . Marital Status: Single    Spouse Name: N/A  . Number of Children: 0  . Years of Education: N/A   Occupational History  . Teacher     Social History Main Topics  . Smoking status: Never Smoker   . Smokeless tobacco: Never Used  . Alcohol Use: No  . Drug Use: No  . Sexual Activity: Not on file   Other Topics Concern  . Not on file   Social History Narrative    Past Surgical History  Procedure Laterality Date  . Eye surgery  1967    right eye  . Colonoscopy  2005  . Cardiac catheterization  2011  . Coronary stent placement    . Angioplasty  2011    Family History  Problem Relation Age of Onset  . Hypertension Mother   . Prostate cancer Brother   . Asthma    . Colon polyps Sister   . Colon polyps Mother   . Heart disease Father     CHF    No Known Allergies  Current Outpatient Prescriptions on File Prior to Visit  Medication Sig Dispense Refill  . amLODipine (NORVASC) 5 MG tablet Take 1 tablet (5 mg total) by mouth daily. 90 tablet 1  . aspirin 81 MG tablet Take 1 tablet (81 mg total) by mouth daily.    Marland Kitchen atorvastatin (LIPITOR) 40 MG tablet take 1 tablet by mouth once daily 90 tablet 1  . clopidogrel (PLAVIX) 75 MG tablet take 1 tablet by mouth once daily 90  tablet 1  . fluticasone (FLONASE) 50 MCG/ACT nasal spray Place 2 sprays into both nostrils daily. 16 g 6  . Na Sulfate-K Sulfate-Mg Sulf SOLN Take 1 kit by mouth once. 354 mL 0  . NITROSTAT 0.4 MG SL tablet PLACE 1 TABLET UNDER THE TONGUE EVERY 5 MINUTES UP TO 3 DOSES IF NEEDED 25 tablet 3  . telmisartan (MICARDIS) 40 MG tablet Take 40 mg by mouth daily.  0   No current facility-administered medications on file prior to visit.    BP 140/90 mmHg  Pulse 72  Temp(Src) 97.9 F (36.6 C) (Oral)  Resp 20  Ht 5' 5.5" (1.664 m)  Wt 197 lb (89.359 kg)  BMI 32.27 kg/m2  SpO2 98%     Review of Systems  Constitutional: Negative for fever, chills, appetite change and fatigue.  HENT: Negative for congestion, dental problem, ear pain, hearing loss, sore throat, tinnitus, trouble swallowing and voice change.   Eyes: Negative for pain, discharge and visual disturbance.  Respiratory: Negative for cough, chest tightness, wheezing and stridor.   Cardiovascular: Negative for chest pain, palpitations and leg swelling.  Gastrointestinal: Negative for  nausea, vomiting, abdominal pain, diarrhea, constipation, blood in stool and abdominal distention.  Genitourinary: Negative for urgency, hematuria, flank pain, discharge, difficulty urinating and genital sores.  Musculoskeletal: Positive for back pain. Negative for myalgias, joint swelling, arthralgias, gait problem and neck stiffness.  Skin: Negative for rash.  Neurological: Negative for dizziness, syncope, speech difficulty, weakness, numbness and headaches.  Hematological: Negative for adenopathy. Does not bruise/bleed easily.  Psychiatric/Behavioral: Negative for behavioral problems and dysphoric mood. The patient is not nervous/anxious.        Objective:   Physical Exam  Constitutional: He appears well-developed and well-nourished. No distress.  Musculoskeletal: Normal range of motion.  Negative straight leg test Reflexes intact No motor  weakness  No abnormalities to palpation in the lumbosacral area          Assessment & Plan:   Lumbar strain.  This seems to be improving quickly.  We'll slowly advance his activities. Information concerning lumbar strain, and prevention of recurrent low back pain, dispensed Essential hypertension, stable  Return as scheduled for follow-up

## 2014-08-14 ENCOUNTER — Other Ambulatory Visit: Payer: Self-pay | Admitting: Internal Medicine

## 2014-09-21 ENCOUNTER — Other Ambulatory Visit: Payer: Self-pay | Admitting: Internal Medicine

## 2014-10-02 ENCOUNTER — Encounter: Payer: Self-pay | Admitting: Gastroenterology

## 2014-10-02 ENCOUNTER — Ambulatory Visit (AMBULATORY_SURGERY_CENTER): Payer: BC Managed Care – PPO | Admitting: Gastroenterology

## 2014-10-02 VITALS — BP 112/67 | HR 66 | Temp 96.4°F | Resp 13 | Ht 65.5 in | Wt 198.0 lb

## 2014-10-02 DIAGNOSIS — K921 Melena: Secondary | ICD-10-CM | POA: Diagnosis not present

## 2014-10-02 DIAGNOSIS — R1012 Left upper quadrant pain: Secondary | ICD-10-CM

## 2014-10-02 DIAGNOSIS — D124 Benign neoplasm of descending colon: Secondary | ICD-10-CM | POA: Diagnosis not present

## 2014-10-02 DIAGNOSIS — D123 Benign neoplasm of transverse colon: Secondary | ICD-10-CM | POA: Diagnosis not present

## 2014-10-02 MED ORDER — RANITIDINE HCL 300 MG PO CAPS: 300.0000 mg | ORAL_CAPSULE | Freq: Every evening | ORAL | Status: DC

## 2014-10-02 MED ORDER — SODIUM CHLORIDE 0.9 % IV SOLN: 500.0000 mL | INTRAVENOUS | Status: DC

## 2014-10-02 NOTE — Progress Notes (Signed)
  [facility] Post-op Note  Patient: Manuel Barker  Procedure(s) Performed: colonoscopy and endoscopy  Patient Location: [facility] - Recovery Area  Anesthesia Type: Deep Sedation/Propofol  Level of Consciousness: awake, oriented and patient cooperative  Airway and Oxygen Therapy: Patient Spontanous Breathing  Post-op Pain: none  Post-op Assessment:  Post-op Vital signs reviewed, Patient's Cardiovascular Status Stable, Respiratory Function Stable, Patent Airway, No signs of Nausea or vomiting and Pain level controlled  Post-op Vital Signs: Reviewed and stable  Complications: No apparent anesthesia complications  Bosco Paparella E 8:44 AM

## 2014-10-02 NOTE — Op Note (Signed)
[place]  [address] [place], [address]   COLONOSCOPY PROCEDURE REPORT  PATIENT: Manuel Barker, Manuel Barker  MR#: [id number] BIRTHDATE: 25-Sep-1950 , 46  yrs. old GENDER: male ENDOSCOPIST: Ladene Artist, MD, [facility] REFERRED GO:TLXBW Burnice Logan, M.D. PROCEDURE DATE:  10/02/2014 PROCEDURE:   Colonoscopy, diagnostic and Colonoscopy with snare polypectomy First Screening Colonoscopy - Avg.  risk and is 50 yrs.  old or older - No.  Prior Negative Screening - Now for repeat screening. N/A  History of Adenoma - Now for follow-up colonoscopy & has been > or = to 3 yrs.  N/A  Polyps removed today? Yes ASA CLASS:   Class III INDICATIONS:Evaluation of unexplained GI bleeding and hematochezia.  MEDICATIONS: Monitored anesthesia care and Propofol 150 mg IV DESCRIPTION OF PROCEDURE:   After the risks benefits and alternatives of the procedure were thoroughly explained, informed consent was obtained.  The digital rectal exam revealed no abnormalities of the rectum.   The LB [id number]  endoscope was introduced through the anus and advanced to the cecum, which was identified by both the appendix and ileocecal valve. No adverse events experienced.   The quality of the prep was (Suprep was used) excellent.  The instrument was then slowly withdrawn as the colon was fully examined. Estimated blood loss is zero unless otherwise noted in this procedure report.  COLON FINDINGS: A sessile polyp measuring 7 mm in size was found in the transverse colon.  A polypectomy was performed with a cold snare.  The resection was complete, the polyp tissue was completely retrieved and sent to histology.   Two sessile polyps measuring 5 mm in size were found in the descending colon.  Polypectomies were performed with a cold snare.  The resection was complete, the polyp tissue was completely retrieved and sent to histology.   The examination was otherwise normal.  Retroflexed views  revealed internal Grade I hemorrhoids. The time to cecum = 2.0 Withdrawal time = 10.3   The scope was withdrawn and the procedure completed. COMPLICATIONS: There were no immediate complications.  ENDOSCOPIC IMPRESSION: 1.   Sessile polyp in the transverse colon; polypectomy performed with a cold snare 2.   Two sessile polyps in the descending colon; polypectomies performed with a cold snare 3.   Grade l internal hemorrhoids  RECOMMENDATIONS: 1.  Await pathology results 2.  Repeat colonoscopy in 5 years if polyp(s) adenomatous; otherwise 10 years 3.  Resume Plavix tomorrow  eSigned:  Ladene Artist, MD, [facility] 10/02/2014 8:40 AM

## 2014-10-02 NOTE — Patient Instructions (Signed)

## 2014-10-02 NOTE — Progress Notes (Signed)
Called to room to assist during endoscopic procedure.  Patient ID and intended procedure confirmed with present staff. Received instructions for my participation in the procedure from the performing physician.  

## 2014-10-02 NOTE — Op Note (Signed)
[name]  [address] [place], [address]   ENDOSCOPY PROCEDURE REPORT  PATIENT: Manuel, Barker  MR#: [id number] BIRTHDATE: [birth year] , 13  yrs. old GENDER: male ENDOSCOPIST: Ladene Artist, MD, [facility] REFERRED BY:  Bluford Kaufmann, M.D. PROCEDURE DATE:  10/02/2014 PROCEDURE:  EGD, diagnostic ASA CLASS:     Class III INDICATIONS:  abdominal pain in upper left quadrant. MEDICATIONS: Monitored anesthesia care, Residual sedation present, and Propofol 100 mg IV TOPICAL ANESTHETIC: none DESCRIPTION OF PROCEDURE: After the risks benefits and alternatives of the procedure were thoroughly explained, informed consent was obtained.  The LB [id number] endoscope was introduced through the mouth and advanced to the second portion of the duodenum , Without limitations.  The instrument was slowly withdrawn as the mucosa was fully examined.    ESOPHAGUS: The mucosa of the esophagus appeared normal. STOMACH: Small, 5 mm, smooth, rounded, submucosal nodule in the fundus. The mucosa of the stomach appeared normal. DUODENUM: The duodenal mucosa showed no abnormalities in the bulb and 2nd part of the duodenum.  Retroflexed views revealed a small hiatal hernia.   The scope was then withdrawn from the patient and the procedure completed.  COMPLICATIONS: There were no immediate complications.  ENDOSCOPIC IMPRESSION: 1.   Small hiatal hernia 2.   Small submucosal nodule 3.   The EGD otherwise appeared normal  RECOMMENDATIONS: 1.  Ranitidine 300 mg po qd, 1 year of refills   eSigned:  Ladene Artist, MD, [facility] 10/02/2014 8:50 AM

## 2014-10-03 ENCOUNTER — Telehealth: Payer: Self-pay | Admitting: *Deleted

## 2014-10-03 NOTE — Telephone Encounter (Signed)
  Follow up Call-  Call back number 10/02/2014  Post procedure Call Back phone  # [phone]  Permission to leave phone message Yes     Patient questions:  Mailbox is full. Unable to leave message.

## 2014-10-07 ENCOUNTER — Encounter: Payer: Self-pay | Admitting: Gastroenterology

## 2014-11-29 ENCOUNTER — Other Ambulatory Visit: Payer: Self-pay | Admitting: Internal Medicine

## 2015-02-28 ENCOUNTER — Other Ambulatory Visit: Payer: Self-pay | Admitting: Internal Medicine

## 2015-03-23 ENCOUNTER — Other Ambulatory Visit: Payer: Self-pay | Admitting: *Deleted

## 2015-03-23 MED ORDER — AMLODIPINE BESYLATE 5 MG PO TABS: 5.0000 mg | ORAL_TABLET | Freq: Every day | ORAL | Status: DC

## 2015-05-18 ENCOUNTER — Other Ambulatory Visit (INDEPENDENT_AMBULATORY_CARE_PROVIDER_SITE_OTHER): Payer: BC Managed Care – PPO

## 2015-05-18 DIAGNOSIS — Z0000 Encounter for general adult medical examination without abnormal findings: Secondary | ICD-10-CM | POA: Diagnosis not present

## 2015-05-18 LAB — CBC WITH DIFFERENTIAL/PLATELET
Basophils Absolute: 0 10*3/uL (ref 0.0–0.1)
Basophils Relative: 0.5 % (ref 0.0–3.0)
Eosinophils Absolute: 0.2 10*3/uL (ref 0.0–0.7)
Eosinophils Relative: 2.1 % (ref 0.0–5.0)
HCT: 44.9 % (ref 39.0–52.0)
Hemoglobin: 14.9 g/dL (ref 13.0–17.0)
Lymphocytes Relative: 20.4 % (ref 12.0–46.0)
Lymphs Abs: 1.5 10*3/uL (ref 0.7–4.0)
MCHC: 33.2 g/dL (ref 30.0–36.0)
MCV: 87.3 fl (ref 78.0–100.0)
Monocytes Absolute: 0.6 10*3/uL (ref 0.1–1.0)
Monocytes Relative: 7.9 % (ref 3.0–12.0)
Neutro Abs: 5.1 10*3/uL (ref 1.4–7.7)
Neutrophils Relative %: 69.1 % (ref 43.0–77.0)
Platelets: 237 10*3/uL (ref 150.0–400.0)
RBC: 5.14 Mil/uL (ref 4.22–5.81)
RDW: 15.1 % (ref 11.5–15.5)
WBC: 7.3 10*3/uL (ref 4.0–10.5)

## 2015-05-18 LAB — POC URINALSYSI DIPSTICK (AUTOMATED)
Bilirubin, UA: NEGATIVE
Blood, UA: NEGATIVE
Glucose, UA: NEGATIVE
Ketones, UA: NEGATIVE
Leukocytes, UA: NEGATIVE
Nitrite, UA: NEGATIVE
Spec Grav, UA: 1.025
Urobilinogen, UA: 1
pH, UA: 6

## 2015-05-18 LAB — BASIC METABOLIC PANEL
BUN: 16 mg/dL (ref 6–23)
CO2: 31 mEq/L (ref 19–32)
Calcium: 9.4 mg/dL (ref 8.4–10.5)
Chloride: 105 mEq/L (ref 96–112)
Creatinine, Ser: 1.07 mg/dL (ref 0.40–1.50)
GFR: 89.28 mL/min (ref >60.00)
Glucose, Bld: 93 mg/dL (ref 70–99)
Potassium: 4.5 mEq/L (ref 3.5–5.1)
Sodium: 140 mEq/L (ref 135–145)

## 2015-05-18 LAB — LIPID PANEL
Cholesterol: 138 mg/dL (ref 0–200)
HDL: 30.5 mg/dL — ABNORMAL LOW (ref >39.00)
LDL Cholesterol: 93 mg/dL (ref 0–99)
NonHDL: 107.81
Total CHOL/HDL Ratio: 5
Triglycerides: 75 mg/dL (ref 0.0–149.0)
VLDL: 15 mg/dL (ref 0.0–40.0)

## 2015-05-18 LAB — HEPATIC FUNCTION PANEL
ALT: 23 U/L (ref 0–53)
AST: 16 U/L (ref 0–37)
Albumin: 4.2 g/dL (ref 3.5–5.2)
Alkaline Phosphatase: 85 U/L (ref 39–117)
Bilirubin, Direct: 0.2 mg/dL (ref 0.0–0.3)
Total Bilirubin: 0.7 mg/dL (ref 0.2–1.2)
Total Protein: 6.8 g/dL (ref 6.0–8.3)

## 2015-05-18 LAB — TSH: TSH: 0.85 u[IU]/mL (ref 0.35–4.50)

## 2015-05-24 ENCOUNTER — Encounter: Payer: Self-pay | Admitting: Internal Medicine

## 2015-05-25 ENCOUNTER — Ambulatory Visit (INDEPENDENT_AMBULATORY_CARE_PROVIDER_SITE_OTHER): Payer: BC Managed Care – PPO | Admitting: Internal Medicine

## 2015-05-25 ENCOUNTER — Encounter: Payer: Self-pay | Admitting: Internal Medicine

## 2015-05-25 VITALS — BP 102/66 | HR 64 | Temp 99.4°F | Ht 65.0 in | Wt 206.0 lb

## 2015-05-25 DIAGNOSIS — I2510 Atherosclerotic heart disease of native coronary artery without angina pectoris: Secondary | ICD-10-CM | POA: Diagnosis not present

## 2015-05-25 DIAGNOSIS — I10 Essential (primary) hypertension: Secondary | ICD-10-CM | POA: Diagnosis not present

## 2015-05-25 DIAGNOSIS — Z0000 Encounter for general adult medical examination without abnormal findings: Secondary | ICD-10-CM | POA: Diagnosis not present

## 2015-05-25 DIAGNOSIS — E785 Hyperlipidemia, unspecified: Secondary | ICD-10-CM

## 2015-05-25 DIAGNOSIS — G4733 Obstructive sleep apnea (adult) (pediatric): Secondary | ICD-10-CM | POA: Diagnosis not present

## 2015-05-25 DIAGNOSIS — Z86010 Personal history of colonic polyps: Secondary | ICD-10-CM

## 2015-05-25 LAB — TESTOSTERONE: Testosterone: 171.26 ng/dL — ABNORMAL LOW (ref 300.00–890.00)

## 2015-05-25 LAB — PSA: PSA: 4.67 ng/mL — ABNORMAL HIGH (ref 0.10–4.00)

## 2015-05-25 NOTE — Progress Notes (Signed)
Pre visit review using our clinic review tool, if applicable. No additional management support is needed unless otherwise documented below in the visit note. 

## 2015-05-25 NOTE — Patient Instructions (Signed)
Limit your sodium (Salt) intake    It is important that you exercise regularly, at least 20 minutes 3 to 4 times per week.  If you develop chest pain or shortness of breath seek  medical attention.  You need to lose weight.  Consider a lower calorie diet and regular exercise.  Return in one year for follow-up 
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
## 2015-05-26 ENCOUNTER — Other Ambulatory Visit: Payer: Self-pay | Admitting: Internal Medicine

## 2015-05-26 DIAGNOSIS — R972 Elevated prostate specific antigen [PSA]: Secondary | ICD-10-CM

## 2015-06-16 ENCOUNTER — Other Ambulatory Visit: Payer: Self-pay | Admitting: Internal Medicine

## 2015-06-16 NOTE — Telephone Encounter (Signed)
Refill sent to pharmacy.   

## 2015-06-17 ENCOUNTER — Other Ambulatory Visit: Payer: Self-pay | Admitting: Internal Medicine

## 2015-09-24 ENCOUNTER — Other Ambulatory Visit: Payer: Self-pay | Admitting: Internal Medicine

## 2015-10-27 ENCOUNTER — Encounter: Payer: Self-pay | Admitting: *Deleted

## 2015-11-16 ENCOUNTER — Encounter: Payer: Self-pay | Admitting: Internal Medicine

## 2015-11-16 ENCOUNTER — Ambulatory Visit (INDEPENDENT_AMBULATORY_CARE_PROVIDER_SITE_OTHER): Payer: BC Managed Care – PPO | Admitting: Internal Medicine

## 2015-11-16 VITALS — BP 130/90 | HR 82 | Temp 98.5°F | Resp 20 | Ht 65.0 in | Wt 204.2 lb

## 2015-11-16 DIAGNOSIS — I10 Essential (primary) hypertension: Secondary | ICD-10-CM | POA: Diagnosis not present

## 2015-11-16 DIAGNOSIS — G4733 Obstructive sleep apnea (adult) (pediatric): Secondary | ICD-10-CM

## 2015-11-16 DIAGNOSIS — J069 Acute upper respiratory infection, unspecified: Secondary | ICD-10-CM

## 2015-11-16 MED ORDER — HYDROCODONE-HOMATROPINE 5-1.5 MG/5ML PO SYRP: 5.0000 mL | ORAL_SOLUTION | Freq: Four times a day (QID) | ORAL | 0 refills | Status: DC | PRN

## 2015-11-16 NOTE — Patient Instructions (Signed)
Acute bronchitis symptoms for less than 10 days are generally not helped by antibiotics.  Take over-the-counter expectorants and cough medications such as  Mucinex DM.  Call if there is no improvement in 5 to 7 days or if  you develop worsening cough, fever, or new symptoms, such as shortness of breath or chest pain.  Take cough medicine as prescribed

## 2015-11-16 NOTE — Progress Notes (Signed)
Pre visit review using our clinic review tool, if applicable. No additional management support is needed unless otherwise documented below in the visit note. 

## 2015-11-16 NOTE — Progress Notes (Signed)
Subjective:    Patient ID: Manuel Barker, male    DOB: 1950-10-26, 65 y.o.   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  Cough is described as dry and nonproductive.  He had fever and chills over the weekend but today has felt much improved.  He has been using Coricidin. He has essential hypertension which has been controlled on amlodipine and Micardis.  He has dyslipidemia and remains on atorvastatin.  Past Medical History:  Diagnosis Date  . Benign prostatic hypertrophy   . CAD (coronary artery disease)   . Hypertension   . OSA (obstructive sleep apnea)   . Seizure disorder [facility])      Social History   Social History  . Marital status: Single    Spouse name: N/A  . Number of children: 0  . Years of education: N/A   Occupational History  . Teacher     Social History Main Topics  . Smoking status: Never Smoker  . Smokeless tobacco: Never Used  . Alcohol use No  . Drug use: No  . Sexual activity: Not on file   Other Topics Concern  . Not on file   Social History Narrative  . No narrative on file    Past Surgical History:  Procedure Laterality Date  . ANGIOPLASTY  2011  . CARDIAC CATHETERIZATION  2011  . COLONOSCOPY  2005  . CORONARY STENT PLACEMENT    . EYE SURGERY  1967   right eye    Family History  Problem Relation Age of Onset  . Hypertension Mother   . Prostate cancer Brother   . Asthma    . Colon polyps Sister   . Colon polyps Mother   . Heart disease Father     CHF    No Known Allergies  Current Outpatient Prescriptions on File Prior to Visit  Medication Sig Dispense Refill  . amLODipine (NORVASC) 5 MG tablet take 1 tablet by mouth once daily 90 tablet 1  . aspirin 81 MG tablet Take 1 tablet (81 mg total) by mouth daily.    Marland Kitchen atorvastatin (LIPITOR) 40 MG tablet take 1 tablet by mouth once daily 90 tablet 1  . clopidogrel  (PLAVIX) 75 MG tablet take 1 tablet by mouth once daily 90 tablet 3  . NITROSTAT 0.4 MG SL tablet PLACE 1 TABLET UNDER THE TONGUE EVERY 5 MINUTES UP TO 3 DOSES IF NEEDED 25 tablet 3  . telmisartan (MICARDIS) 40 MG tablet take 1 tablet by mouth once daily 90 tablet 3   No current facility-administered medications on file prior to visit.     BP 130/90 (BP Location: Left Arm, Patient Position: Sitting, Cuff Size: Normal)   Pulse 82   Temp 98.5 F (36.9 C) (Oral)   Resp 20   Ht 5\' 5"  (1.651 m)   Wt 204 lb 4 oz (92.6 kg)   SpO2 98%   BMI 33.99 kg/m     Review of Systems  Constitutional: Positive for activity change, appetite change, chills, fatigue and fever.  HENT: Negative for congestion, dental problem, ear pain, hearing loss, sore throat, tinnitus, trouble swallowing and voice change.   Eyes: Negative for pain, discharge and visual disturbance.  Respiratory: Positive for cough. Negative for chest tightness, wheezing and stridor.   Cardiovascular: Negative for chest pain, palpitations and leg swelling.  Gastrointestinal: Negative for abdominal distention,  abdominal pain, blood in stool, constipation, diarrhea, nausea and vomiting.  Genitourinary: Negative for difficulty urinating, discharge, flank pain, genital sores, hematuria and urgency.  Musculoskeletal: Negative for arthralgias, back pain, gait problem, joint swelling, myalgias and neck stiffness.  Skin: Negative for rash.  Neurological: Negative for dizziness, syncope, speech difficulty, weakness, numbness and headaches.  Hematological: Negative for adenopathy. Does not bruise/bleed easily.  Psychiatric/Behavioral: Negative for behavioral problems and dysphoric mood. The patient is not nervous/anxious.        Objective:   Physical Exam  Constitutional: He is oriented to person, place, and time. He appears well-developed and well-nourished. He appears distressed.   Afebrile.  No distress Pulse 80 Temperature 98.5, O2 sat  ration 98  HENT:  Head: Normocephalic.  Right Ear: External ear normal.  Left Ear: External ear normal.  Eyes: Conjunctivae and EOM are normal.  Neck: Normal range of motion.  Cardiovascular: Normal rate and normal heart sounds.   Pulmonary/Chest: Breath sounds normal. No respiratory distress. He has no wheezes. He has no rales.  Abdominal: Bowel sounds are normal.  Musculoskeletal: Normal range of motion. He exhibits no edema or tenderness.  Neurological: He is alert and oriented to person, place, and time.  Psychiatric: He has a normal mood and affect. His behavior is normal.          Assessment & Plan:   Viral URI.  Patient is much improved today and fever and chills have resolved.  Chest is clear to auscultation.  Will treat symptomatically.  Patient has been asked to call the office if fever recurs and will be considered for a chest x-ray to exclude any acquired pneumonia Essential hypertension, stable  Nyoka Cowden

## 2016-01-12 ENCOUNTER — Other Ambulatory Visit: Payer: Self-pay | Admitting: Urology

## 2016-01-12 DIAGNOSIS — C61 Malignant neoplasm of prostate: Secondary | ICD-10-CM

## 2016-01-25 ENCOUNTER — Ambulatory Visit (HOSPITAL_COMMUNITY)
Admission: RE | Admit: 2016-01-25 | Discharge: 2016-01-25 | Disposition: A | Discharge status: Home / Self Care | Payer: BC Managed Care – PPO | Source: Ambulatory Visit | Attending: Urology | Admitting: Urology

## 2016-01-25 DIAGNOSIS — C61 Malignant neoplasm of prostate: Secondary | ICD-10-CM | POA: Diagnosis present

## 2016-01-25 LAB — POCT I-STAT CREATININE: Creatinine, Ser: 0.9 mg/dL (ref 0.61–1.24)

## 2016-01-25 MED ORDER — GADOBENATE DIMEGLUMINE 529 MG/ML IV SOLN
20.0000 mL | Freq: Once | INTRAVENOUS | Status: AC | PRN
  Administered 2016-01-25: 19 mL via INTRAVENOUS

## 2016-03-25 ENCOUNTER — Other Ambulatory Visit: Payer: Self-pay | Admitting: Internal Medicine

## 2016-06-17 ENCOUNTER — Other Ambulatory Visit: Payer: Self-pay | Admitting: Internal Medicine

## 2016-06-28 ENCOUNTER — Other Ambulatory Visit: Payer: Self-pay | Admitting: Internal Medicine

## 2016-06-28 DIAGNOSIS — C61 Malignant neoplasm of prostate: Secondary | ICD-10-CM | POA: Diagnosis not present

## 2016-06-29 ENCOUNTER — Ambulatory Visit (INDEPENDENT_AMBULATORY_CARE_PROVIDER_SITE_OTHER): Payer: BC Managed Care – PPO | Admitting: Internal Medicine

## 2016-06-29 ENCOUNTER — Encounter: Payer: Self-pay | Admitting: Internal Medicine

## 2016-06-29 VITALS — BP 120/78 | HR 73 | Temp 98.0°F | Ht 65.0 in | Wt 203.0 lb

## 2016-06-29 DIAGNOSIS — C61 Malignant neoplasm of prostate: Secondary | ICD-10-CM

## 2016-06-29 DIAGNOSIS — I10 Essential (primary) hypertension: Secondary | ICD-10-CM

## 2016-06-29 DIAGNOSIS — I2510 Atherosclerotic heart disease of native coronary artery without angina pectoris: Secondary | ICD-10-CM

## 2016-06-29 DIAGNOSIS — E785 Hyperlipidemia, unspecified: Secondary | ICD-10-CM | POA: Diagnosis not present

## 2016-06-29 LAB — COMPREHENSIVE METABOLIC PANEL
ALT: 21 U/L (ref 0–53)
AST: 17 U/L (ref 0–37)
Albumin: 4.2 g/dL (ref 3.5–5.2)
Alkaline Phosphatase: 87 U/L (ref 39–117)
BUN: 11 mg/dL (ref 6–23)
CO2: 26 mEq/L (ref 19–32)
Calcium: 9.3 mg/dL (ref 8.4–10.5)
Chloride: 107 mEq/L (ref 96–112)
Creatinine, Ser: 0.99 mg/dL (ref 0.40–1.50)
GFR: 97.31 mL/min (ref >60.00)
Glucose, Bld: 99 mg/dL (ref 70–99)
Potassium: 4 mEq/L (ref 3.5–5.1)
Sodium: 139 mEq/L (ref 135–145)
Total Bilirubin: 0.5 mg/dL (ref 0.2–1.2)
Total Protein: 6.9 g/dL (ref 6.0–8.3)

## 2016-06-29 LAB — CBC WITH DIFFERENTIAL/PLATELET
Basophils Absolute: 0 10*3/uL (ref 0.0–0.1)
Basophils Relative: 0.8 % (ref 0.0–3.0)
Eosinophils Absolute: 0.1 10*3/uL (ref 0.0–0.7)
Eosinophils Relative: 2.3 % (ref 0.0–5.0)
HCT: 46 % (ref 39.0–52.0)
Hemoglobin: 15.4 g/dL (ref 13.0–17.0)
Lymphocytes Relative: 20.6 % (ref 12.0–46.0)
Lymphs Abs: 1.1 10*3/uL (ref 0.7–4.0)
MCHC: 33.4 g/dL (ref 30.0–36.0)
MCV: 86.2 fl (ref 78.0–100.0)
Monocytes Absolute: 0.5 10*3/uL (ref 0.1–1.0)
Monocytes Relative: 10.1 % (ref 3.0–12.0)
Neutro Abs: 3.4 10*3/uL (ref 1.4–7.7)
Neutrophils Relative %: 66.2 % (ref 43.0–77.0)
Platelets: 225 10*3/uL (ref 150.0–400.0)
RBC: 5.34 Mil/uL (ref 4.22–5.81)
RDW: 14.4 % (ref 11.5–15.5)
WBC: 5.2 10*3/uL (ref 4.0–10.5)

## 2016-06-29 LAB — LIPID PANEL
Cholesterol: 162 mg/dL (ref 0–200)
HDL: 38.9 mg/dL — ABNORMAL LOW (ref >39.00)
LDL Cholesterol: 111 mg/dL — ABNORMAL HIGH (ref 0–99)
NonHDL: 122.95
Total CHOL/HDL Ratio: 4
Triglycerides: 58 mg/dL (ref 0.0–149.0)
VLDL: 11.6 mg/dL (ref 0.0–40.0)

## 2016-06-29 LAB — PSA: PSA: 7.06 ng/mL — ABNORMAL HIGH (ref 0.10–4.00)

## 2016-06-29 LAB — TSH: TSH: 1.38 u[IU]/mL (ref 0.35–4.50)

## 2016-06-29 MED ORDER — NITROGLYCERIN 0.4 MG SL SUBL: SUBLINGUAL_TABLET | SUBLINGUAL | 3 refills | Status: DC

## 2016-06-29 NOTE — Progress Notes (Signed)
Subjective:    Patient ID: Manuel Barker, male    DOB: 1950/12/04, 66 y.o.   MRN: [id number]  HPI  66 year old patient who is seen today for follow-up.  He was diagnosed last year with prostate cancer and is contemplating RT. Doing well.  He has coronary artery disease which has been stable.  Remains on statin therapy for dyslipidemia.  He has essential hypertension which has been stable  Past Medical History:  Diagnosis Date  . Benign prostatic hypertrophy   . CAD (coronary artery disease)   . Hypertension   . OSA (obstructive sleep apnea)   . Seizure disorder [facility])      Social History   Social History  . Marital status: Single    Spouse name: N/A  . Number of children: 0  . Years of education: N/A   Occupational History  . Teacher     Social History Main Topics  . Smoking status: Never Smoker  . Smokeless tobacco: Never Used  . Alcohol use No  . Drug use: No  . Sexual activity: Not on file   Other Topics Concern  . Not on file   Social History Narrative  . No narrative on file    Past Surgical History:  Procedure Laterality Date  . ANGIOPLASTY  2011  . CARDIAC CATHETERIZATION  2011  . COLONOSCOPY  2005  . CORONARY STENT PLACEMENT    . EYE SURGERY  1967   right eye    Family History  Problem Relation Age of Onset  . Hypertension Mother   . Prostate cancer Brother   . Asthma Unknown   . Colon polyps Sister   . Colon polyps Mother   . Heart disease Father        CHF    No Known Allergies  Current Outpatient Prescriptions on File Prior to Visit  Medication Sig Dispense Refill  . amLODipine (NORVASC) 5 MG tablet take 1 tablet by mouth once daily 90 tablet 1  . aspirin 81 MG tablet Take 1 tablet (81 mg total) by mouth daily.    Marland Kitchen atorvastatin (LIPITOR) 40 MG tablet take 1 tablet by mouth once daily 90 tablet 1  . clopidogrel (PLAVIX) 75 MG tablet take 1 tablet by mouth once daily 90 tablet 1  . telmisartan (MICARDIS) 40 MG tablet take 1 tablet by  mouth once daily 30 tablet 0  . HYDROcodone-homatropine (HYCODAN) 5-1.5 MG/5ML syrup Take 5 mLs by mouth every 6 (six) hours as needed for cough. (Patient not taking: Reported on 06/29/2016) 120 mL 0   No current facility-administered medications on file prior to visit.     BP 120/78 (BP Location: Left Arm, Patient Position: Sitting, Cuff Size: Normal)   Pulse 73   Temp 98 F (36.7 C) (Oral)   Ht 5\' 5"  (1.651 m)   Wt 203 lb (92.1 kg)   SpO2 97%   BMI 33.78 kg/m     Review of Systems  Constitutional: Negative for appetite change, chills, fatigue and fever.  HENT: Negative for congestion, dental problem, ear pain, hearing loss, sore throat, tinnitus, trouble swallowing and voice change.   Eyes: Negative for pain, discharge and visual disturbance.  Respiratory: Negative for cough, chest tightness, wheezing and stridor.   Cardiovascular: Negative for chest pain, palpitations and leg swelling.  Gastrointestinal: Negative for abdominal distention, abdominal pain, blood in stool, constipation, diarrhea, nausea and vomiting.  Genitourinary: Negative for difficulty urinating, discharge, flank pain, genital sores, hematuria and urgency.  Musculoskeletal:  Negative for arthralgias, back pain, gait problem, joint swelling, myalgias and neck stiffness.  Skin: Negative for rash.  Neurological: Negative for dizziness, syncope, speech difficulty, weakness, numbness and headaches.  Hematological: Negative for adenopathy. Does not bruise/bleed easily.  Psychiatric/Behavioral: Negative for behavioral problems and dysphoric mood. The patient is not nervous/anxious.        Objective:   Physical Exam  Constitutional: He is oriented to person, place, and time. He appears well-developed.  Blood pressure 120/80  HENT:  Head: Normocephalic.  Right Ear: External ear normal.  Left Ear: External ear normal.  Eyes: Conjunctivae and EOM are normal.  Neck: Normal range of motion.  Cardiovascular: Normal  rate and normal heart sounds.   Pulmonary/Chest: Breath sounds normal.  Abdominal: Bowel sounds are normal.  Musculoskeletal: Normal range of motion. He exhibits no edema or tenderness.  Neurological: He is alert and oriented to person, place, and time.  Psychiatric: He has a normal mood and affect. His behavior is normal.          Assessment & Plan:   Prostate cancer.  We'll recheck PSA Essential hypertension, controlled Coronary artery disease, stable  Follow-up urology Check screening lab CPX one year  Nyoka Cowden

## 2016-06-29 NOTE — Patient Instructions (Addendum)
WE NOW OFFER   [facility] FAST TRACK!!!  SAME DAY Appointments for ACUTE CARE  Such as: Sprains, Injuries, cuts, abrasions, rashes, muscle pain, joint pain, back pain Colds, flu, sore throats, headache, allergies, cough, fever  Ear pain, sinus and eye infections Abdominal pain, nausea, vomiting, diarrhea, upset stomach Animal/insect bites  3 Easy Ways to Schedule: Walk-In Scheduling Call in scheduling Mychart Sign-up: [email or web address]   Limit your sodium (Salt) intake  Please check your blood pressure on a regular basis.  If it is consistently greater than 150/90, please make an office appointment.    It is important that you exercise regularly, at least 20 minutes 3 to 4 times per week.  If you develop chest pain or shortness of breath seek  medical attention.  Return in 6 months for follow-up

## 2016-07-15 ENCOUNTER — Telehealth: Payer: Self-pay | Admitting: Internal Medicine

## 2016-07-15 ENCOUNTER — Other Ambulatory Visit (HOSPITAL_COMMUNITY): Payer: Self-pay | Admitting: Radiation Oncology

## 2016-07-15 DIAGNOSIS — C61 Malignant neoplasm of prostate: Secondary | ICD-10-CM

## 2016-07-15 NOTE — Telephone Encounter (Signed)
° °  Pt call to say Dr Raliegh Ip wanted him to give his PSA results to his Urologist doctor at [facility] Dr Diona Fanti.He call to give them the numbers of his PSA and they said they could not take the number over the phone they needed to faxed over.  [phone]

## 2016-07-18 NOTE — Telephone Encounter (Signed)
Fax confirmation received  at 10:09am 07/18/16

## 2016-07-18 NOTE — Telephone Encounter (Signed)
Results faxed.

## 2016-07-22 ENCOUNTER — Ambulatory Visit (HOSPITAL_COMMUNITY): Admission: RE | Admit: 2016-07-22 | Payer: BC Managed Care – PPO | Source: Ambulatory Visit

## 2016-07-22 ENCOUNTER — Encounter (HOSPITAL_COMMUNITY): Payer: BC Managed Care – PPO

## 2016-07-29 ENCOUNTER — Ambulatory Visit (HOSPITAL_COMMUNITY): Payer: BC Managed Care – PPO

## 2016-07-29 ENCOUNTER — Other Ambulatory Visit (HOSPITAL_COMMUNITY): Payer: BC Managed Care – PPO

## 2016-08-01 ENCOUNTER — Encounter (HOSPITAL_COMMUNITY): Payer: Self-pay

## 2016-08-01 ENCOUNTER — Ambulatory Visit (HOSPITAL_COMMUNITY): Admission: RE | Admit: 2016-08-01 | Payer: BC Managed Care – PPO | Source: Ambulatory Visit

## 2016-08-01 ENCOUNTER — Other Ambulatory Visit (HOSPITAL_COMMUNITY): Payer: BC Managed Care – PPO

## 2016-08-01 ENCOUNTER — Encounter (HOSPITAL_COMMUNITY)
Admission: RE | Admit: 2016-08-01 | Discharge: 2016-08-01 | Disposition: A | Discharge status: Home / Self Care | Payer: BC Managed Care – PPO | Source: Ambulatory Visit | Attending: Radiation Oncology | Admitting: Radiation Oncology

## 2016-08-01 ENCOUNTER — Ambulatory Visit (HOSPITAL_COMMUNITY): Payer: BC Managed Care – PPO

## 2016-08-01 DIAGNOSIS — R911 Solitary pulmonary nodule: Secondary | ICD-10-CM | POA: Diagnosis not present

## 2016-08-01 DIAGNOSIS — Z8042 Family history of malignant neoplasm of prostate: Secondary | ICD-10-CM | POA: Insufficient documentation

## 2016-08-01 DIAGNOSIS — I700 Atherosclerosis of aorta: Secondary | ICD-10-CM | POA: Diagnosis not present

## 2016-08-01 DIAGNOSIS — C61 Malignant neoplasm of prostate: Secondary | ICD-10-CM | POA: Insufficient documentation

## 2016-08-01 DIAGNOSIS — Z8052 Family history of malignant neoplasm of bladder: Secondary | ICD-10-CM | POA: Insufficient documentation

## 2016-08-01 DIAGNOSIS — N289 Disorder of kidney and ureter, unspecified: Secondary | ICD-10-CM | POA: Insufficient documentation

## 2016-08-01 MED ORDER — IOPAMIDOL (ISOVUE-300) INJECTION 61%
100.0000 mL | Freq: Once | INTRAVENOUS | Status: AC | PRN
  Administered 2016-08-01: 100 mL via INTRAVENOUS

## 2016-08-01 MED ORDER — IOPAMIDOL (ISOVUE-300) INJECTION 61%
INTRAVENOUS | Status: AC
  Filled 2016-08-01: qty 100

## 2016-08-03 ENCOUNTER — Ambulatory Visit (HOSPITAL_COMMUNITY): Payer: BC Managed Care – PPO

## 2016-08-08 ENCOUNTER — Telehealth: Payer: Self-pay | Admitting: Internal Medicine

## 2016-08-08 ENCOUNTER — Ambulatory Visit (HOSPITAL_COMMUNITY)
Admission: RE | Admit: 2016-08-08 | Discharge: 2016-08-08 | Disposition: A | Discharge status: Home / Self Care | Payer: BC Managed Care – PPO | Source: Ambulatory Visit | Attending: Radiation Oncology | Admitting: Radiation Oncology

## 2016-08-08 DIAGNOSIS — C61 Malignant neoplasm of prostate: Secondary | ICD-10-CM | POA: Insufficient documentation

## 2016-08-08 MED ORDER — LIDOCAINE HCL 2 % EX GEL: 1.0000 "application " | Freq: Once | CUTANEOUS | Status: DC

## 2016-08-08 MED ORDER — GADOBENATE DIMEGLUMINE 529 MG/ML IV SOLN
20.0000 mL | Freq: Once | INTRAVENOUS | Status: AC | PRN
  Administered 2016-08-08: 18 mL via INTRAVENOUS

## 2016-08-08 MED ORDER — LIDOCAINE HCL 2 % EX GEL
CUTANEOUS | Status: AC
  Filled 2016-08-08: qty 30

## 2016-08-08 NOTE — Telephone Encounter (Signed)
Please ask patient to schedule a follow-up office visit in 3 months.  Will need follow-up chest CT scan at that time to rule out pulmonary nodules

## 2016-08-08 NOTE — Telephone Encounter (Signed)
Please advise 

## 2016-08-08 NOTE — Telephone Encounter (Addendum)
Pt had CT done in July and states they found "other things"  Pt would like Dr Raliegh Ip to look at the results and advise on anything else pt needs to do from here. Pt has a biopsy today at 1pm

## 2016-08-08 NOTE — Telephone Encounter (Signed)
I called the pt and informed him of the message below and an appt was scheduled for 11/6.

## 2016-08-10 ENCOUNTER — Other Ambulatory Visit: Payer: Self-pay | Admitting: Internal Medicine

## 2016-08-10 ENCOUNTER — Telehealth: Payer: Self-pay | Admitting: Internal Medicine

## 2016-08-10 NOTE — Telephone Encounter (Signed)
pt is requesting the following is pt eligible for pna vaccine and HepC?

## 2016-08-10 NOTE — Telephone Encounter (Signed)
Please advise 

## 2016-08-12 NOTE — Telephone Encounter (Signed)
Yes.  Okay to start vaccinations for pneumonia initially with Prevnar, then Pneumovax in one year

## 2016-08-12 NOTE — Telephone Encounter (Signed)
Nurse visit app made for Monday 08/18/16 @ 0845 for Prevnar 13 vaccination.

## 2016-08-15 ENCOUNTER — Ambulatory Visit (INDEPENDENT_AMBULATORY_CARE_PROVIDER_SITE_OTHER): Payer: BC Managed Care – PPO

## 2016-08-15 DIAGNOSIS — Z23 Encounter for immunization: Secondary | ICD-10-CM

## 2016-08-15 NOTE — Progress Notes (Signed)
Pt came in for his Prevnar vaccine, He is aware that he needs to return in one year per Dr. Matilde Haymaker phone note 8/8) for next pneumococcal vaccine. He had no additional questions at this time.

## 2016-08-17 ENCOUNTER — Encounter: Payer: Self-pay | Admitting: Internal Medicine

## 2016-09-02 DIAGNOSIS — C61 Malignant neoplasm of prostate: Secondary | ICD-10-CM | POA: Diagnosis not present

## 2016-09-04 ENCOUNTER — Other Ambulatory Visit: Payer: Self-pay | Admitting: Internal Medicine

## 2016-09-09 ENCOUNTER — Ambulatory Visit: Payer: BC Managed Care – PPO | Admitting: Internal Medicine

## 2016-09-09 ENCOUNTER — Ambulatory Visit: Admit: 2016-09-09 | Discharge: 2016-09-09 | Payer: PRIVATE HEALTH INSURANCE | Attending: Urology

## 2016-09-09 DIAGNOSIS — R972 Elevated prostate specific antigen [PSA]: Secondary | ICD-10-CM

## 2016-09-09 LAB — AMB POC URINALYSIS DIP STICK AUTO W/O MICRO
Bilirubin (UA POC): NEGATIVE
Blood (UA POC): NEGATIVE
Glucose (UA POC): NEGATIVE
Ketones (UA POC): NEGATIVE
Leukocyte esterase (UA POC): NEGATIVE
Nitrites (UA POC): NEGATIVE
Protein (UA POC): NEGATIVE
Specific gravity (UA POC): 1.01 (ref 1.001–1.035)
Urobilinogen (UA POC): 0.2 (ref 0.2–1)
pH (UA POC): 7 (ref 4.6–8.0)

## 2016-09-09 LAB — AMB POC PVR, MEAS,POST-VOID RES,US,NON-IMAGING: PVR: 17 cc

## 2016-09-09 NOTE — Progress Notes (Signed)
ICD-10-CM ICD-9-CM    1. Elevated PSA R97.20 790.93 AMB POC URINALYSIS DIP STICK AUTO W/O MICRO      AMB POC PVR, MEAS,POST-VOID RES,US,NON-IMAGING          ASSESSMENT:    1. 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 presenting PSA 4.7 ng/ml. MRI prostate 08/08/2016 revealed 5 mm PIRADS 4 lesion RIGHT base, 5 mm PI-RADS 4 lesion LEFT mid gland and 7 mm PI-RADS 4 lesion LEFT apex. No extracapsular extension and no lymphadenopathy. Bone scan 08/01/3016 revealed no evidence of skeletal metastasis.  Primary urologist has already discussed Hormonal therapy and will start this for patient upon request.      PLAN:    Dr. Wynona Luna to discuss plan with Dr. Denice Paradise   Vitamin D and Calcium for 6 months recommended to patient  Hormone therapy recommended to patient for at least 6 months   Start hormone therapy 1 month before spacers  Plan for fiducial markers and space OAR gel in 10/2016    Will call patient with plan     Discussion: Fiducial markers and space OAR gel was discussed with patient at length including procedure, side effects and complications.     Chief Complaint   Patient presents with   ??? Elevated PSA       HISTORY OF PRESENT ILLNESS:  Tony Brown is a 66 y.o. male who presents today, referred by [facility] proton therapy, to discuss brachytherapy treatment. Patient was diagnosed in 11/18/2015 with Gleason 4+3=7, in 9 of the 12 cores. Patient presents today doing well and has had no recent changes in health or urinary symptoms since his diagnosis.  His urine today is negative for infection.      4 brothers with history of prostate cancer      No flowsheet data found.    Past Medical History:   Diagnosis Date   ??? BPH (benign prostatic hyperplasia)    ??? CAD (coronary artery disease)    ??? Hypertension    ??? Hypogonadism in male    ??? OSA (obstructive sleep apnea)    ??? Prostate cancer ([place]) 11/2015    Gleason 4+3, 9/12 cores 70%   ??? Seizure disorder ([place])         Past Surgical History:   Procedure Laterality Date   ??? HX ANGIOPLASTY     ??? HX COLONOSCOPY     ??? HX CORONARY ARTERY BYPASS GRAFT     ??? HX CORONARY STENT PLACEMENT     ??? HX UROLOGICAL  11/2015    Prostate biopsy       Social History   Substance Use Topics   ??? Smoking status: Never Smoker   ??? Smokeless tobacco: Never Used   ??? Alcohol use No       Not on File    Family History   Problem Relation Age of Onset   ??? Cancer Other      Bladder and Prostate             Review of Systems  Constitutional: Fever: No  Skin: Rash: No  HEENT: Hearing difficulty: No  Eyes: Blurred vision: No  Cardiovascular: Chest pain: No  Respiratory: Shortness of breath: No  Gastrointestinal: Nausea/vomiting: No  Musculoskeletal: Back pain: No  Neurological: Weakness: No  Psychological: Memory loss: No  Comments/additional findings:       Visit Vitals   ??? BP 118/78   ??? Ht 5\' 6"  (1.676 m)   ???  Wt 195 lb (88.5 kg)   ??? BMI 31.47 kg/m2     Constitutional: WDWN, Pleasant and appropriate affect, No acute distress.    CV:  No peripheral swelling noted  Respiratory: No respiratory distress or difficulties  GU Male:    DRE: Perineum normal to visual inspection, no erythema or irritation, Sphincter with good tone, Rectum with no hemorrhoids, fissures or masses.  Prostate is moderate and .  Skin: No jaundice.    Neuro/Psych:  Alert and oriented x 3. Affect appropriate.         REVIEW OF LABS AND IMAGING:      Results for orders placed or performed in visit on 09/09/16   AMB POC URINALYSIS DIP STICK AUTO W/O MICRO   Result Value Ref Range    Color (UA POC) Light Yellow     Clarity (UA POC) Clear     Glucose (UA POC) Negative Negative    Bilirubin (UA POC) Negative Negative    Ketones (UA POC) Negative Negative    Specific gravity (UA POC) 1.010 1.001 - 1.035    Blood (UA POC) Negative Negative    pH (UA POC) 7.0 4.6 - 8.0    Protein (UA POC) Negative Negative    Urobilinogen (UA POC) 0.2 mg/dL 0.2 - 1    Nitrites (UA POC) Negative Negative     Leukocyte esterase (UA POC) Negative Negative   AMB POC PVR, MEAS,POST-VOID RES,US,NON-IMAGING   Result Value Ref Range    PVR 17 cc       MRI prostate 08/08/2016   IMPRESSION:  1. Small (5 mm) PIRADS 4 lesion RIGHT base.  2. Small (5 mm) PI-RADS 4 lesion LEFT mid gland.  3. Small (7 mm) PI-RADS 4 lesion LEFT apex.  4. No extracapsular extension.????No lymphadenopathy.    Bone scan 08/01/2016   IMPRESSION:  No scintigraphic evidence skeletal metastasis      Tony Bailey, MD  [facility] [place].   [place], [place]  [phone] (office)        Medical documentation is provided with the assistance of Cindie Crumbly, medical scribe for Tony Bailey, MD on 09/09/2016

## 2016-09-15 NOTE — Telephone Encounter (Signed)
Patient called requesting to speak with Dr Wynona Luna or his nurse. Patient stated Dr Wynona Luna was suppose to referred him to Dr Denice Paradise for Hormone Therapy, but he havent heard from anybody yet. Also he would like to have the  fiducial markers and space OAR gel   close home , He found a place called [facility] in [place] with Dr Diona Fanti. Please contact patient for further information.

## 2016-09-17 ENCOUNTER — Other Ambulatory Visit: Payer: Self-pay | Admitting: Internal Medicine

## 2016-09-22 ENCOUNTER — Ambulatory Visit (INDEPENDENT_AMBULATORY_CARE_PROVIDER_SITE_OTHER): Payer: BC Managed Care – PPO | Admitting: Internal Medicine

## 2016-09-22 ENCOUNTER — Encounter: Payer: Self-pay | Admitting: Internal Medicine

## 2016-09-22 ENCOUNTER — Telehealth (HOSPITAL_COMMUNITY): Payer: Self-pay | Admitting: *Deleted

## 2016-09-22 VITALS — BP 130/84 | HR 68 | Ht 65.0 in | Wt 205.0 lb

## 2016-09-22 DIAGNOSIS — R5383 Other fatigue: Secondary | ICD-10-CM | POA: Diagnosis not present

## 2016-09-22 DIAGNOSIS — E785 Hyperlipidemia, unspecified: Secondary | ICD-10-CM | POA: Diagnosis not present

## 2016-09-22 DIAGNOSIS — I10 Essential (primary) hypertension: Secondary | ICD-10-CM

## 2016-09-22 DIAGNOSIS — I2510 Atherosclerotic heart disease of native coronary artery without angina pectoris: Secondary | ICD-10-CM

## 2016-09-22 MED ORDER — EZETIMIBE 10 MG PO TABS: 10.0000 mg | ORAL_TABLET | Freq: Every day | ORAL | 3 refills | Status: DC

## 2016-09-22 NOTE — Telephone Encounter (Signed)
Patient given detailed instructions per Myocardial Perfusion Study Information Sheet for the test on 09/26/16. Patient notified to arrive 15 minutes early and that it is imperative to arrive on time for appointment to keep from having the test rescheduled.  If you need to cancel or reschedule your appointment, please call the office within 24 hours of your appointment. . Patient verbalized understanding. Manuel Barker

## 2016-09-22 NOTE — Progress Notes (Signed)
Cardiology Office Note   Date:  09/22/2016   ID:  Manuel Barker 07-Sep-1950, MRN [id number]  PCP:  Marletta Lor, MD  Cardiologist:   [name], MD   No chief complaint on file.  F?U of CAD    History of Present Illness: Manuel Barker is a 66 y.o. male with a history of CAD. Cardiac ath in 2011 was done that showed: LMNormal; LAD: 90% apical; 70% D1, : LCx: 95% proximal; 99% mid. The paitent underwent PTCA/DES x3 to the LCx.  I saw the pt in July 2016    People tell he he sounds like SOB when walks   Has had some chest tightness  Last spell one month ago      DX prostate CA   [place]   [place]    Current Outpatient Prescriptions  Medication Sig Dispense Refill  . amLODipine (NORVASC) 5 MG tablet take 1 tablet by mouth once daily 90 tablet 1  . aspirin 81 MG tablet Take 1 tablet (81 mg total) by mouth daily.    Marland Kitchen atorvastatin (LIPITOR) 40 MG tablet take 1 tablet by mouth once daily 90 tablet 1  . clopidogrel (PLAVIX) 75 MG tablet take 1 tablet by mouth once daily 90 tablet 1  . HYDROcodone-homatropine (HYCODAN) 5-1.5 MG/5ML syrup Take 5 mLs by mouth every 6 (six) hours as needed for cough. 120 mL 0  . nitroGLYCERIN (NITROSTAT) 0.4 MG SL tablet PLACE 1 TABLET UNDER THE TONGUE EVERY 5 MINUTES UP TO 3 DOSES IF NEEDED 25 tablet 3  . telmisartan (MICARDIS) 40 MG tablet take 1 tablet by mouth once daily 30 tablet 0   No current facility-administered medications for this visit.     Allergies:   Patient has no known allergies.   Past Medical History:  Diagnosis Date  . Benign prostatic hypertrophy   . CAD (coronary artery disease)   . Cancer [facility])    prostate  . Hypertension   . OSA (obstructive sleep apnea)   . Seizure disorder [facility])     Past Surgical History:  Procedure Laterality Date  . ANGIOPLASTY  2011  . CARDIAC CATHETERIZATION  2011  . COLONOSCOPY  2005  . CORONARY STENT PLACEMENT    . EYE SURGERY  1967   right eye     Social  History:  The patient  reports that he has never smoked. He has never used smokeless tobacco. He reports that he does not drink alcohol or use drugs.   Family History:  The patient's family history includes Asthma in his unknown relative; Colon polyps in his mother and sister; Heart disease in his father; Hypertension in his mother; Prostate cancer in his brother.    ROS:  Please see the history of present illness. All other systems are reviewed and  Negative to the above problem except as noted.    PHYSICAL EXAM: VS:  BP 130/84   Pulse 68   Ht 5\' 5"  (1.651 m)   Wt 205 lb (93 kg)   BMI 34.11 kg/m   GEN: Well nourished, well developed, in no acute distress HEENT: normal Neck: JVP normal  No, carotid bruits, or masses Cardiac: RRR;no murmurs, rubs, or gallops,No signif  edema  Respiratory:  clear to auscultation bilaterally, normal work of breathing GI: soft, nontender, nondistended, + BS  No hepatomegaly  MS: no deformity Moving all extremities   Skin: warm and dry, no rash Neuro:  Strength and sensation are  intact Psych: euthymic mood, full affect   EKG:  EKG is ordered today.  SR 68  T wave inversion inferolateraly  Unchanged     Lipid Panel    Component Value Date/Time   CHOL 162 06/29/2016 0854   TRIG 58.0 06/29/2016 0854   HDL 38.90 (L) 06/29/2016 0854   CHOLHDL 4 06/29/2016 0854   VLDL 11.6 06/29/2016 0854   LDLCALC 111 (H) 06/29/2016 0854   LDLDIRECT 179.1 08/21/2007 0946      Wt Readings from Last 3 Encounters:  09/22/16 205 lb (93 kg)  06/29/16 203 lb (92.1 kg)  11/16/15 204 lb 4 oz (92.6 kg)      ASSESSMENT AND PLAN:  1.  CAD   Patient has some concerning symptoms  I would recomm an echo to evaluate LV thickness, LV function  I would also set up for stressmyoview.  2.  HL  Keep on statin   3.  HTN  Adequate control.  F/U will be based on test results AT least would f/u in 1 year.    Signed, [name], MD  09/22/2016 10:06 AM    [address], [place], [place]  [address] Phone: [phone]; Fax: [phone]

## 2016-09-22 NOTE — Patient Instructions (Signed)
Your physician has recommended you make the following change in your medication:  1.) ADD ZETIA 10 MG ONCE A DAY (CHOLESTEROL)  Your physician recommends that you return for lab work in: [place] ([place])  Your physician has requested that you have en exercise stress myoview. For further information please visit [email or web address]. Please follow instruction sheet, as given.  Your physician wants you to follow-up in: August, 2019 with Dr. Harrington Challenger.  You will receive a reminder letter in the mail two months in advance. If you don't receive a letter, please call our office to schedule the follow-up appointment.

## 2016-09-23 NOTE — Telephone Encounter (Signed)
Patient called back - he said that Dr. Wynona Luna is supposed to contact Dr. Denice Paradise to discuss plan of treatment, but so far, Dr. Albina Billet office has not heard back from Dr. Wynona Luna yet.    Patient's contact [phone]

## 2016-09-26 ENCOUNTER — Ambulatory Visit (HOSPITAL_COMMUNITY): Payer: BC Managed Care – PPO | Attending: Internal Medicine

## 2016-09-26 DIAGNOSIS — I2510 Atherosclerotic heart disease of native coronary artery without angina pectoris: Secondary | ICD-10-CM | POA: Diagnosis not present

## 2016-09-26 DIAGNOSIS — R5383 Other fatigue: Secondary | ICD-10-CM | POA: Insufficient documentation

## 2016-09-26 LAB — MYOCARDIAL PERFUSION IMAGING
Estimated workload: 10.5 METS
Exercise duration (min): 9 min
LV dias vol: 107 mL (ref 62–150)
LV sys vol: 50 mL
MPHR: 154 {beats}/min
Peak HR: 142 {beats}/min
Percent HR: 92 %
RATE: 0.29
RPE: 18
Rest HR: 60 {beats}/min
SDS: 2
SRS: 3
SSS: 5
TID: 1.08

## 2016-09-26 MED ORDER — TECHNETIUM TC 99M TETROFOSMIN IV KIT
32.4000 | PACK | Freq: Once | INTRAVENOUS | Status: AC | PRN
  Administered 2016-09-26: 32.4 via INTRAVENOUS
  Filled 2016-09-26: qty 33

## 2016-09-26 MED ORDER — TECHNETIUM TC 99M TETROFOSMIN IV KIT
10.1000 | PACK | Freq: Once | INTRAVENOUS | Status: AC | PRN
  Administered 2016-09-26: 10.1 via INTRAVENOUS
  Filled 2016-09-26: qty 11

## 2016-09-27 NOTE — Telephone Encounter (Signed)
Patient has placed several phone calls. He said when he saw Dr.Rawls the last time he said he would call Dr.Dahstedt to see if his Fiducial Markers and Spacers can be done where he is in [place]. He would also like to do the Hormone therapy their. He called the dr.s office and stated that  the dr. Michela Pitcher he was not called  He needs this information and would like a phone call so he knows what is going on.

## 2016-09-28 ENCOUNTER — Telehealth: Payer: Self-pay | Admitting: *Deleted

## 2016-09-28 DIAGNOSIS — R5383 Other fatigue: Secondary | ICD-10-CM

## 2016-09-28 DIAGNOSIS — R0789 Other chest pain: Secondary | ICD-10-CM

## 2016-09-28 NOTE — Telephone Encounter (Signed)
Patient called back, reviewed myoview results and recommendation for echo.  Pt is concerned about his ongoing SOB episodes.  Echo has been scheduled for Monday 10/1.  Will send message to precert.

## 2016-09-28 NOTE — Telephone Encounter (Signed)
Follow up     Patient returning call for results.

## 2016-09-28 NOTE — Telephone Encounter (Signed)
-----   Message from [name] V, MD sent at 09/27/2016  1:00 AM EDT ----- No significant ischemia on myovue  Small apical ischemia (pt has known distal LAD lesion) I am not convinced SOB is due to blood flow problem Keep on same meds

## 2016-09-28 NOTE — Telephone Encounter (Signed)
Myoview esults released to MyChart patient portal by MD. Per OV note, Dr. Harrington Challenger also recommends echo to eval LV thickness. Called and left voice mail for patient to call back to discuss.

## 2016-09-29 ENCOUNTER — Encounter

## 2016-09-29 NOTE — Telephone Encounter (Signed)
Called Mr. Tony Brown, introduced myself and informed him I would f/u with Dr. Alan Ripper office to see if  We could get his hormone treatment arranged.  Mr. Tony Brown reviewed his situation, agreed to f/u with   [name] and [facility]. Informed patient that we would f/u with him after talking with doctors.  Provided Mr. Tony Brown with navigation's direct number it needed.  Mr. Tony Brown appreciated the call.  Call to [name] @ [phone], left message with doctor's staff requesting return call.  S/w Dr. Tori Milks at [place], provided him with [facility] to discuss coordination of   Care for patient to possibly receive ADT and seeds/OAR gel with doctors in [place], closer to patient's home.   Dr. Callie Fielding agreed to contact patient's general urologist and would f/u with our office.  Signed By: Laveda Norman, RN     September 29, 2016

## 2016-09-29 NOTE — Telephone Encounter (Signed)
Spoke with pt he very exasperated and he has been waiting for Dr. Wynona Luna to communicate with Dr. In [place] to coordinate his ADT injection and marker placement. He has not heard anything to this day. I advised I will get him to our cancer treatment center her at [facility] to try and expedite the process due to lack of communication. Pt agreed and referral sent.

## 2016-09-29 NOTE — Telephone Encounter (Signed)
I have called the pt since a referral was put in, however the pt does not want to set up an appointment, he would like Dr Tony Brown to call the Dr in [facility] first.     Tony Brown

## 2016-09-29 NOTE — Telephone Encounter (Signed)
Unable to leave message will try again later.  Sheralyn Boatman, LPN

## 2016-09-30 NOTE — Progress Notes (Signed)
S/w Dr. Tori Milks, he s/w patient's urology office in [place], [place] and they will be coordinating   Patient's care for hormone therapy and seeds/gel.  Dr. Callie Fielding also s/w patient and he is aware  Of plan.  Signed By: Laveda Norman, RN     September 30, 2016

## 2016-10-03 ENCOUNTER — Other Ambulatory Visit: Payer: Self-pay

## 2016-10-03 ENCOUNTER — Ambulatory Visit (HOSPITAL_COMMUNITY): Payer: BC Managed Care – PPO | Attending: Internal Medicine

## 2016-10-03 DIAGNOSIS — R0789 Other chest pain: Secondary | ICD-10-CM | POA: Insufficient documentation

## 2016-10-03 DIAGNOSIS — R5383 Other fatigue: Secondary | ICD-10-CM | POA: Insufficient documentation

## 2016-10-10 ENCOUNTER — Encounter: Payer: Self-pay | Admitting: Internal Medicine

## 2016-10-10 ENCOUNTER — Other Ambulatory Visit: Payer: Self-pay | Admitting: Internal Medicine

## 2016-10-10 ENCOUNTER — Ambulatory Visit (INDEPENDENT_AMBULATORY_CARE_PROVIDER_SITE_OTHER): Payer: BC Managed Care – PPO | Admitting: Internal Medicine

## 2016-10-10 VITALS — BP 118/76 | HR 78 | Temp 97.7°F | Ht 65.0 in | Wt 204.6 lb

## 2016-10-10 DIAGNOSIS — R911 Solitary pulmonary nodule: Secondary | ICD-10-CM

## 2016-10-10 DIAGNOSIS — N281 Cyst of kidney, acquired: Secondary | ICD-10-CM | POA: Diagnosis not present

## 2016-10-10 DIAGNOSIS — I2510 Atherosclerotic heart disease of native coronary artery without angina pectoris: Secondary | ICD-10-CM

## 2016-10-10 DIAGNOSIS — Z23 Encounter for immunization: Secondary | ICD-10-CM

## 2016-10-10 DIAGNOSIS — G4733 Obstructive sleep apnea (adult) (pediatric): Secondary | ICD-10-CM | POA: Diagnosis not present

## 2016-10-10 DIAGNOSIS — Z87898 Personal history of other specified conditions: Secondary | ICD-10-CM

## 2016-10-10 DIAGNOSIS — Z8709 Personal history of other diseases of the respiratory system: Secondary | ICD-10-CM | POA: Diagnosis not present

## 2016-10-10 MED ORDER — HYDROCODONE-HOMATROPINE 5-1.5 MG/5ML PO SYRP: 5.0000 mL | ORAL_SOLUTION | Freq: Four times a day (QID) | ORAL | 0 refills | Status: DC | PRN

## 2016-10-10 MED ORDER — FLUTICASONE PROPIONATE 50 MCG/ACT NA SUSP: 2.0000 | Freq: Every day | NASAL | 6 refills | Status: DC

## 2016-10-10 NOTE — Progress Notes (Signed)
Subjective:    Patient ID: Manuel Barker, male    DOB: [birth year], 66 y.o.   MRN: [id number]  HPI  66 year old patient who is followed closely by urology due to prostate cancer. He recently had staging abdominal CT scan on July 30 that revealed a 10 x 13 mm irregular right lower lobe pulmonary nodule. The patient presents today with a chief complaint of chronic cough of greater than 1 year's duration.  Cough is nonproductive. Denies any history of allergies or asthma.  Denies sinus congestion or postnasal drip.  No reflux symptoms.  The patient has hypertension and is treated with Micardis. He has a history of OSA on CPAP.  His wife complains of daytime sleepiness.  He has not had pulmonary follow-up in some time. The patient denies any shortness or breath, but family has commented on apparent DOE.  Past Medical History:  Diagnosis Date  . Benign prostatic hypertrophy   . CAD (coronary artery disease)   . Cancer [facility])    prostate  . Hypertension   . OSA (obstructive sleep apnea)   . Seizure disorder [facility])      Social History   Social History  . Marital status: Married    Spouse name: N/A  . Number of children: 0  . Years of education: N/A   Occupational History  . Teacher     Social History Main Topics  . Smoking status: Never Smoker  . Smokeless tobacco: Never Used  . Alcohol use No  . Drug use: No  . Sexual activity: Not on file   Other Topics Concern  . Not on file   Social History Narrative  . No narrative on file    Past Surgical History:  Procedure Laterality Date  . ANGIOPLASTY  2011  . CARDIAC CATHETERIZATION  2011  . COLONOSCOPY  2005  . CORONARY STENT PLACEMENT    . EYE SURGERY  1967   right eye    Family History  Problem Relation Age of Onset  . Heart disease Father        CHF  . Hypertension Mother   . Colon polyps Mother   . Prostate cancer Brother   . Asthma Unknown   . Colon polyps Sister     No Known Allergies  Current  Outpatient Prescriptions on File Prior to Visit  Medication Sig Dispense Refill  . amLODipine (NORVASC) 5 MG tablet take 1 tablet by mouth once daily 90 tablet 1  . aspirin 81 MG tablet Take 1 tablet (81 mg total) by mouth daily.    Marland Kitchen atorvastatin (LIPITOR) 40 MG tablet take 1 tablet by mouth once daily 90 tablet 1  . clopidogrel (PLAVIX) 75 MG tablet take 1 tablet by mouth once daily 90 tablet 1  . ezetimibe (ZETIA) 10 MG tablet Take 1 tablet (10 mg total) by mouth daily. 90 tablet 3  . nitroGLYCERIN (NITROSTAT) 0.4 MG SL tablet PLACE 1 TABLET UNDER THE TONGUE EVERY 5 MINUTES UP TO 3 DOSES IF NEEDED 25 tablet 3  . telmisartan (MICARDIS) 40 MG tablet take 1 tablet by mouth once daily 30 tablet 0   No current facility-administered medications on file prior to visit.     BP 118/76 (BP Location: Left Arm, Patient Position: Sitting, Cuff Size: Normal)   Pulse 78   Temp 97.7 F (36.5 C) (Oral)   Ht 5\' 5"  (1.651 m)   Wt 204 lb 9.6 oz (92.8 kg)   SpO2 98%   BMI 34.05  kg/m     Review of Systems  Constitutional: Negative for appetite change, chills, fatigue and fever.  HENT: Negative for congestion, dental problem, ear pain, hearing loss, sore throat, tinnitus, trouble swallowing and voice change.   Eyes: Negative for pain, discharge and visual disturbance.  Respiratory: Positive for cough and shortness of breath. Negative for chest tightness, wheezing and stridor.   Cardiovascular: Negative for chest pain, palpitations and leg swelling.  Gastrointestinal: Negative for abdominal distention, abdominal pain, blood in stool, constipation, diarrhea, nausea and vomiting.  Genitourinary: Negative for difficulty urinating, discharge, flank pain, genital sores, hematuria and urgency.  Musculoskeletal: Negative for arthralgias, back pain, gait problem, joint swelling, myalgias and neck stiffness.  Skin: Negative for rash.  Neurological: Negative for dizziness, syncope, speech difficulty, weakness,  numbness and headaches.  Hematological: Negative for adenopathy. Does not bruise/bleed easily.  Psychiatric/Behavioral: Negative for behavioral problems and dysphoric mood. The patient is not nervous/anxious.        Objective:   Physical Exam  Constitutional: He is oriented to person, place, and time. He appears well-developed.  HENT:  Head: Normocephalic.  Right Ear: External ear normal.  Left Ear: External ear normal.  Eyes: Conjunctivae and EOM are normal.  Neck: Normal range of motion.  Cardiovascular: Normal rate and normal heart sounds.   Pulmonary/Chest: Breath sounds normal.  Abdominal: Bowel sounds are normal.  Musculoskeletal: Normal range of motion. He exhibits no edema or tenderness.  Neurological: He is alert and oriented to person, place, and time.  Psychiatric: He has a normal mood and affect. His behavior is normal.          Assessment & Plan:   Chronic cough.  Will treat  with a nonsedating antihistamine and fluticasone nasal spray for possible upper airway cough syndrome.  Will treat also with a antireflux regimen but hold PPI therapy in  view of Plavix therapy.  Consider PFTs  OSA.  Needs pulmonary follow-up  10 x 13 mm right lower lobe pulmonary nodule.  Will set up  for pulmonary evaluation Needs follow-up chest CT next month  Prostate cancer  Essential hypertension.  Presently treated with ARB  Nyoka Cowden

## 2016-10-10 NOTE — Patient Instructions (Addendum)
Fluticasone nasal spray.  Use daily Use a nonsedating antihistamine such as Allegra or Zyrtec once daily  Pulmonary consultation as discussed  Hydrate and Humidify  Drink enough water to keep your urine clear or pale yellow. Staying hydrated will help to thin your mucus.  Use a cool mist humidifier to keep the humidity level in your home above 50%.  Inhale steam for 10-15 minutes, 3-4 times a day or as told by your health care provider. You can do this in the bathroom while a hot shower is running.  Limit your exposure to cool or dry air.  Avoids foods high in acid such as tomatoes citrus juices, and spicy foods.  Avoid eating within two hours of lying down or before exercising.  Do not overheat.  Try smaller more frequent meals.    Pepcid AC 1 tablet twice daily

## 2016-10-17 ENCOUNTER — Other Ambulatory Visit: Payer: Self-pay | Admitting: Urology

## 2016-10-19 ENCOUNTER — Other Ambulatory Visit: Payer: Self-pay | Admitting: Urology

## 2016-10-20 MED ORDER — FLEET ENEMA 7-19 GM/118ML RE ENEM: 1.0000 | ENEMA | Freq: Once | RECTAL | Status: AC

## 2016-10-24 ENCOUNTER — Other Ambulatory Visit: Payer: Self-pay | Admitting: Urology

## 2016-10-24 DIAGNOSIS — C61 Malignant neoplasm of prostate: Secondary | ICD-10-CM

## 2016-10-25 ENCOUNTER — Ambulatory Visit (INDEPENDENT_AMBULATORY_CARE_PROVIDER_SITE_OTHER): Payer: BC Managed Care – PPO | Admitting: Internal Medicine

## 2016-10-25 ENCOUNTER — Encounter: Payer: Self-pay | Admitting: Internal Medicine

## 2016-10-25 VITALS — BP 128/78 | HR 77 | Temp 98.1°F | Ht 65.0 in | Wt 205.4 lb

## 2016-10-25 DIAGNOSIS — I2510 Atherosclerotic heart disease of native coronary artery without angina pectoris: Secondary | ICD-10-CM

## 2016-10-25 DIAGNOSIS — I10 Essential (primary) hypertension: Secondary | ICD-10-CM | POA: Diagnosis not present

## 2016-10-25 NOTE — Patient Instructions (Signed)
Pulmonary follow-up tomorrow as scheduled  Report any new or worsening symptoms  Call or return to clinic prn if these symptoms worsen or fail to improve as anticipated.
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
## 2016-10-26 ENCOUNTER — Encounter: Payer: Self-pay | Admitting: Pulmonary Disease

## 2016-10-26 ENCOUNTER — Ambulatory Visit (INDEPENDENT_AMBULATORY_CARE_PROVIDER_SITE_OTHER): Payer: BC Managed Care – PPO | Admitting: Pulmonary Disease

## 2016-10-26 ENCOUNTER — Other Ambulatory Visit (INDEPENDENT_AMBULATORY_CARE_PROVIDER_SITE_OTHER): Payer: BC Managed Care – PPO

## 2016-10-26 VITALS — BP 126/84 | HR 68 | Ht 66.0 in | Wt 203.0 lb

## 2016-10-26 DIAGNOSIS — R0602 Shortness of breath: Secondary | ICD-10-CM

## 2016-10-26 DIAGNOSIS — R911 Solitary pulmonary nodule: Secondary | ICD-10-CM | POA: Diagnosis not present

## 2016-10-26 DIAGNOSIS — G4733 Obstructive sleep apnea (adult) (pediatric): Secondary | ICD-10-CM | POA: Diagnosis not present

## 2016-10-26 LAB — C-REACTIVE PROTEIN: CRP: 0.1 mg/dL — ABNORMAL LOW (ref 0.5–20.0)

## 2016-10-26 LAB — SEDIMENTATION RATE: Sed Rate: 8 mm/hr (ref 0–20)

## 2016-10-26 NOTE — Patient Instructions (Signed)
Schedule you for a PET/CT of the chest Check ANA with reflex, ACE level, rheumatoid factor, CCP, ANCA, sed rate, CRP You will need a repeat split night sleep study  Follow-up in 1 month

## 2016-10-26 NOTE — Progress Notes (Signed)
Manuel Barker    [id number]    1950/06/05  Primary Care Physician:Kwiatkowski, Doretha Sou, MD  Referring Physician: Marletta Lor, MD [place], [place] [address]  Chief complaint: Consult for evaluation of lung nodules  HPI: 66 year old with history of hypertension, hyperlipidemia, OSA, prostate cancer.  He has an incidental finding of right lower lobe lung nodule found on CT scan of the abdomen in July 2018.  She has been diagnosed with prostate cancer in November 2017.  He is currently on hormone therapy and is scheduled to start proton therapy in [place].  His chief complaint today is chronic cough which is been ongoing for the past 1 year.  This is nonproductive in nature.  There is no sputum production, hemoptysis.  He also has dyspnea with exertion.  No dyspnea at rest or wheezing He has history of sleep apnea diagnosed over 7 years ago with AHI of 60.  He has been maintained on CPAP of 17 cm with no change for the past 7 years.  He has noticed increasing daytime somnolence.  His wife reports that he became unresponsive last week at the movie theater and fell out of the chair.  He does have history of seizures but there were no reported seizure activity. He has been referred here for evaluation of sleep and possible narcolepsy or restless leg syndrome.  EPSS- 7.  Talks in his sleep.  Does not walk in his sleep, no grinding teeth Has acted out dreams while asleep Has frequent nightmares Denies any symptoms of sleep paralysis, hypnagogic/hypnopompic hallucination, cataplexy Denies any funny feelings in the legs at night or leg movements at night.  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  No mold exposure Smoking history:  no smoking history Travel History: No recent travel history  Outpatient Encounter Prescriptions as of 10/26/2016  Medication Sig  . amLODipine (NORVASC) 5 MG tablet  take 1 tablet by mouth once daily  . aspirin 81 MG tablet Take 1 tablet (81 mg total) by mouth daily.  Marland Kitchen atorvastatin (LIPITOR) 40 MG tablet take 1 tablet by mouth once daily  . clopidogrel (PLAVIX) 75 MG tablet take 1 tablet by mouth once daily  . ezetimibe (ZETIA) 10 MG tablet Take 1 tablet (10 mg total) by mouth daily.  . fluticasone (FLONASE) 50 MCG/ACT nasal spray Place 2 sprays into both nostrils daily.  . nitroGLYCERIN (NITROSTAT) 0.4 MG SL tablet PLACE 1 TABLET UNDER THE TONGUE EVERY 5 MINUTES UP TO 3 DOSES IF NEEDED  . telmisartan (MICARDIS) 40 MG tablet take 1 tablet by mouth once daily   Facility-Administered Encounter Medications as of 10/26/2016  Medication  . sodium phosphate (FLEET) 7-19 GM/118ML enema 1 enema    Allergies as of 10/26/2016  . (No Known Allergies)    Past Medical History:  Diagnosis Date  . Benign prostatic hypertrophy   . CAD (coronary artery disease)   . Cancer [facility])    prostate  . Hypertension   . OSA (obstructive sleep apnea)   . Seizure disorder [facility])     Past Surgical History:  Procedure Laterality Date  . ANGIOPLASTY  2011  . CARDIAC CATHETERIZATION  2011  . COLONOSCOPY  2005  . CORONARY STENT PLACEMENT    . EYE SURGERY  1967   right eye    Family History  Problem Relation Age of Onset  . Heart disease Father  CHF  . Hypertension Mother   . Colon polyps Mother   . Bladder Cancer Mother   . Prostate cancer Brother   . Asthma Unknown   . Colon polyps Sister     Social History   Social History  . Marital status: Married    Spouse name: N/A  . Number of children: 0  . Years of education: N/A   Occupational History  . Teacher     Social History Main Topics  . Smoking status: Never Smoker  . Smokeless tobacco: Never Used  . Alcohol use No  . Drug use: No  . Sexual activity: Not Currently   Other Topics Concern  . Not on file   Social History Narrative  . No narrative on file    Review of systems: Review  of Systems  Constitutional: Negative for fever and chills.  HENT: Negative.   Eyes: Negative for blurred vision.  Respiratory: as per HPI  Cardiovascular: Negative for chest pain and palpitations.  Gastrointestinal: Negative for vomiting, diarrhea, blood per rectum. Genitourinary: Negative for dysuria, urgency, frequency and hematuria.  Musculoskeletal: Negative for myalgias, back pain and joint pain.  Skin: Negative for itching and rash.  Neurological: Negative for dizziness, tremors, focal weakness, seizures and loss of consciousness.  Endo/Heme/Allergies: Negative for environmental allergies.  Psychiatric/Behavioral: Negative for depression, suicidal ideas and hallucinations.  All other systems reviewed and are negative.  Physical Exam: Blood pressure 126/84, pulse 68, height 5\' 6"  (1.676 m), weight 92.1 kg (203 lb), SpO2 100 %. Gen:      No acute distress HEENT:  EOMI, sclera anicteric Neck:     No masses; no thyromegaly Lungs:    Clear to auscultation bilaterally; normal respiratory effort CV:         Regular rate and rhythm; no murmurs Abd:      + bowel sounds; soft, non-tender; no palpable masses, no distension Ext:    No edema; adequate peripheral perfusion Skin:      Warm and dry; no rash Neuro: alert and oriented x 3 Psych: normal mood and affect  Data Reviewed: CT abdomen pelvis 08/01/16-13 mm right lower lobe pulmonary nodule one-point.  No lymphadenopathy, 8 mm lesion in the right kidney. I reviewed all images personally  Assessment:  Severe OSA He has not been reevaluated over the past 5 years.  Reports increasing daytime somnolence and an episode of syncope recently.  This could be worsening sleep apnea as he reports increasing weight.  No clear evidence of parasomnia, RLS or narcolepsy. WIll revaluate with repeat sleep study with MSLT.    Lung nodule.  Incidental finding on CT abd.  Has protstate cancer. Will evaluate with PET scan Check blood work for ACE, RF,  CCP, CRP, sed rate, ANA, ANCA.   Plan/Recommendations: - Sleep study with MSLT - PET scan - Blood work for lung nodule as detailed above.  Marshell Garfinkel MD [facility] Pager [phone] 10/26/2016, 2:01 PM  CC: Marletta Lor, MD

## 2016-10-27 LAB — ANA W/REFLEX: Anti Nuclear Antibody(ANA): NEGATIVE

## 2016-10-30 LAB — RHEUMATOID FACTOR: Rhuematoid fact SerPl-aCnc: <14 IU/mL (ref <14)

## 2016-10-30 LAB — ANCA SCREEN W REFLEX TITER: ANCA SCREEN: NEGATIVE

## 2016-10-30 LAB — CYCLIC CITRUL PEPTIDE ANTIBODY, IGG: Cyclic Citrullin Peptide Ab: <16 UNITS

## 2016-11-01 ENCOUNTER — Institutional Professional Consult (permissible substitution): Payer: BC Managed Care – PPO | Admitting: Pulmonary Disease

## 2016-11-04 ENCOUNTER — Telehealth: Payer: Self-pay | Admitting: Internal Medicine

## 2016-11-04 NOTE — Telephone Encounter (Signed)
Pt saw dr Raliegh Ip on 10-25-16 and has 3 month ov sch for nov 6-18. Pt would like to know if he should keep nov 6 appt. Please disregard. Pt has cancelled appt on 11-08-16

## 2016-11-07 ENCOUNTER — Ambulatory Visit (HOSPITAL_COMMUNITY)
Admission: RE | Admit: 2016-11-07 | Discharge: 2016-11-07 | Disposition: A | Discharge status: Home / Self Care | Payer: BC Managed Care – PPO | Source: Ambulatory Visit | Attending: Pulmonary Disease | Admitting: Pulmonary Disease

## 2016-11-07 DIAGNOSIS — R918 Other nonspecific abnormal finding of lung field: Secondary | ICD-10-CM | POA: Diagnosis not present

## 2016-11-07 DIAGNOSIS — R911 Solitary pulmonary nodule: Secondary | ICD-10-CM | POA: Diagnosis not present

## 2016-11-07 LAB — GLUCOSE, CAPILLARY: Glucose-Capillary: 111 mg/dL — ABNORMAL HIGH (ref 65–99)

## 2016-11-07 MED ORDER — FLUDEOXYGLUCOSE F - 18 (FDG) INJECTION
10.0000 | Freq: Once | INTRAVENOUS | Status: AC | PRN
  Administered 2016-11-07: 10 via INTRAVENOUS

## 2016-11-07 NOTE — Telephone Encounter (Signed)
Please advise 

## 2016-11-08 ENCOUNTER — Ambulatory Visit: Payer: BC Managed Care – PPO | Admitting: Internal Medicine

## 2016-11-09 NOTE — Addendum Note (Signed)
Addended by: Maryanna Shape A on: 11/09/2016 09:51 AM   Modules accepted: Orders

## 2016-11-14 ENCOUNTER — Other Ambulatory Visit: Payer: Self-pay | Admitting: Internal Medicine

## 2016-11-17 ENCOUNTER — Other Ambulatory Visit: Payer: BC Managed Care – PPO | Admitting: *Deleted

## 2016-11-17 DIAGNOSIS — E785 Hyperlipidemia, unspecified: Secondary | ICD-10-CM

## 2016-11-17 LAB — LIPID PANEL
Chol/HDL Ratio: 3.1 ratio (ref 0.0–5.0)
Cholesterol, Total: 133 mg/dL (ref 100–199)
HDL: 43 mg/dL (ref >39)
LDL Calculated: 75 mg/dL (ref 0–99)
Triglycerides: 74 mg/dL (ref 0–149)
VLDL Cholesterol Cal: 15 mg/dL (ref 5–40)

## 2016-11-18 ENCOUNTER — Other Ambulatory Visit: Payer: Self-pay | Admitting: *Deleted

## 2016-11-18 MED ORDER — EZETIMIBE 10 MG PO TABS: 10.0000 mg | ORAL_TABLET | Freq: Every day | ORAL | 3 refills | Status: DC

## 2016-11-21 ENCOUNTER — Other Ambulatory Visit: Payer: Self-pay | Admitting: Internal Medicine

## 2016-11-21 ENCOUNTER — Telehealth: Payer: Self-pay | Admitting: *Deleted

## 2016-11-21 DIAGNOSIS — E785 Hyperlipidemia, unspecified: Secondary | ICD-10-CM

## 2016-11-21 MED ORDER — ROSUVASTATIN CALCIUM 40 MG PO TABS: 40.0000 mg | ORAL_TABLET | Freq: Every day | ORAL | 3 refills | Status: DC

## 2016-11-21 NOTE — Telephone Encounter (Signed)
-----   Message from [name] V, MD sent at 11/18/2016  2:47 PM EST ----- I would try Crestor 40 instead of lipitor  Keep on Zetia  See if that is adequate to lower LDL less than 70 Check lpid panel in 8 wks.

## 2016-11-21 NOTE — Telephone Encounter (Signed)
S/w pt today and went over new recommendation per Dr. Harrington Challenger. Pt aware to stop Lipitor and start Crestor 40 mg daily, new Rx has been sent in. Lipid and Liver 01/16/17. Pt states he may have to change due to he may be out of town. Advised pt to call back if he needs to change appt. Pt thanked me for my call. Changes have been made in pt's chart.

## 2016-11-22 ENCOUNTER — Ambulatory Visit (INDEPENDENT_AMBULATORY_CARE_PROVIDER_SITE_OTHER): Payer: BC Managed Care – PPO | Admitting: Pulmonary Disease

## 2016-11-22 ENCOUNTER — Other Ambulatory Visit: Payer: BC Managed Care – PPO

## 2016-11-22 ENCOUNTER — Encounter: Payer: Self-pay | Admitting: Pulmonary Disease

## 2016-11-22 VITALS — BP 130/70 | HR 79 | Ht 66.0 in | Wt 201.5 lb

## 2016-11-22 DIAGNOSIS — G4733 Obstructive sleep apnea (adult) (pediatric): Secondary | ICD-10-CM | POA: Diagnosis not present

## 2016-11-22 DIAGNOSIS — R911 Solitary pulmonary nodule: Secondary | ICD-10-CM | POA: Diagnosis not present

## 2016-11-22 DIAGNOSIS — I2510 Atherosclerotic heart disease of native coronary artery without angina pectoris: Secondary | ICD-10-CM

## 2016-11-22 NOTE — Patient Instructions (Signed)
We will order a CT chest without contrast in 6 months time for follow-up of lung nodule Please go ahead with a sleep study as scheduled We will check angiotensin-converting enzyme today Follow-up in 6 months after CT scan.

## 2016-11-22 NOTE — Progress Notes (Signed)
Manuel Barker    [id number]    Jul 19, 1950  Primary Care Physician:Kwiatkowski, Doretha Sou, MD  Referring Physician: Marletta Lor, MD [place], [place] [address]  Chief complaint: Follow up for  Lung nodule Sleep apnea  HPI: 66 year old with history of hypertension, hyperlipidemia, OSA, prostate cancer.  He has an incidental finding of right lower lobe lung nodule found on CT scan of the abdomen in July 2018.  She has been diagnosed with prostate cancer in November 2017.  He is currently on hormone therapy and is scheduled to start proton therapy in [place].  His chief complaint today is chronic cough which is been ongoing for the past 1 year.  This is nonproductive in nature.  There is no sputum production, hemoptysis.  He also has dyspnea with exertion.  No dyspnea at rest or wheezing He has history of sleep apnea diagnosed over 7 years ago with AHI of 60.  He has been maintained on CPAP of 17 cm with no change for the past 7 years.  He has noticed increasing daytime somnolence.  His wife reports that he became unresponsive last week at the movie theater and fell out of the chair.  He does have history of seizures but there were no reported seizure activity. He has been referred here for evaluation of sleep and possible narcolepsy or restless leg syndrome.  EPSS- 7.  Talks in his sleep.  Does not walk in his sleep, no grinding teeth Has acted out dreams while asleep Has frequent nightmares Denies any symptoms of sleep paralysis, hypnagogic/hypnopompic hallucination, cataplexy Denies any funny feelings in the legs at night or leg movements at night.  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  No mold exposure Smoking history:  no smoking history Travel History: No recent travel history  Outpatient Encounter Medications as of 11/22/2016  Medication Sig  . amLODipine (NORVASC) 5 MG tablet  take 1 tablet by mouth once daily  . aspirin 81 MG tablet Take 1 tablet (81 mg total) by mouth daily.  Marland Kitchen atorvastatin (LIPITOR) 40 MG tablet take 1 tablet by mouth once daily  . clopidogrel (PLAVIX) 75 MG tablet take 1 tablet by mouth once daily  . ezetimibe (ZETIA) 10 MG tablet Take 1 tablet (10 mg total) daily by mouth.  . fluticasone (FLONASE) 50 MCG/ACT nasal spray Place 2 sprays into both nostrils daily.  . nitroGLYCERIN (NITROSTAT) 0.4 MG SL tablet PLACE 1 TABLET UNDER THE TONGUE EVERY 5 MINUTES UP TO 3 DOSES IF NEEDED  . rosuvastatin (CRESTOR) 40 MG tablet Take 1 tablet (40 mg total) daily by mouth.  . telmisartan (MICARDIS) 40 MG tablet take 1 tablet by mouth once daily   Facility-Administered Encounter Medications as of 11/22/2016  Medication  . sodium phosphate (FLEET) 7-19 GM/118ML enema 1 enema    Allergies as of 11/22/2016  . (No Known Allergies)    Past Medical History:  Diagnosis Date  . Benign prostatic hypertrophy   . CAD (coronary artery disease)   . Cancer [facility])    prostate  . Hypertension   . OSA (obstructive sleep apnea)   . Seizure disorder [facility])     Past Surgical History:  Procedure Laterality Date  . ANGIOPLASTY  2011  . CARDIAC CATHETERIZATION  2011  . COLONOSCOPY  2005  . CORONARY STENT PLACEMENT    . [place]   right eye  Family History  Problem Relation Age of Onset  . Heart disease Father        CHF  . Hypertension Mother   . Colon polyps Mother   . Bladder Cancer Mother   . Prostate cancer Brother   . Asthma Unknown   . Colon polyps Sister     Social History   Socioeconomic History  . Marital status: Married    Spouse name: Not on file  . Number of children: 0  . Years of education: Not on file  . Highest education level: Not on file  Social Needs  . Financial resource strain: Not on file  . Food insecurity - worry: Not on file  . Food insecurity - inability: Not on file  . Transportation needs - medical: Not on  file  . Transportation needs - non-medical: Not on file  Occupational History  . Occupation: Pharmacist, hospital   Tobacco Use  . Smoking status: Never Smoker  . Smokeless tobacco: Never Used  Substance and Sexual Activity  . Alcohol use: No  . Drug use: No  . Sexual activity: Not Currently  Other Topics Concern  . Not on file  Social History Narrative  . Not on file    Review of systems: Review of Systems  Constitutional: Negative for fever and chills.  HENT: Negative.   Eyes: Negative for blurred vision.  Respiratory: as per HPI  Cardiovascular: Negative for chest pain and palpitations.  Gastrointestinal: Negative for vomiting, diarrhea, blood per rectum. Genitourinary: Negative for dysuria, urgency, frequency and hematuria.  Musculoskeletal: Negative for myalgias, back pain and joint pain.  Skin: Negative for itching and rash.  Neurological: Negative for dizziness, tremors, focal weakness, seizures and loss of consciousness.  Endo/Heme/Allergies: Negative for environmental allergies.  Psychiatric/Behavioral: Negative for depression, suicidal ideas and hallucinations.  All other systems reviewed and are negative.  Physical Exam: Blood pressure 130/70, pulse 79, height 5\' 6"  (1.676 m), weight 201 lb 8 oz (91.4 kg), SpO2 98 %. Gen:      No acute distress HEENT:  EOMI, sclera anicteric Neck:     No masses; no thyromegaly Lungs:    Clear to auscultation bilaterally; normal respiratory effort CV:         Regular rate and rhythm; no murmurs Abd:      + bowel sounds; soft, non-tender; no palpable masses, no distension Ext:    No edema; adequate peripheral perfusion Skin:      Warm and dry; no rash Neuro: alert and oriented x 3 Psych: normal mood and affect  Data Reviewed: CT abdomen pelvis 08/01/16-13 mm right lower lobe pulmonary nodule.  No lymphadenopathy, 8 mm lesion in the right kidney. PET scan 11/07/16-.  Low-grade in right lower lobe pulmonary nodule I reviewed all images  personally  Labs 10/26/16 ANA, ANCA, CCP, rheumatoid factor-negative Sed rate- 8, CRP 0.1 ACE levels pending  Assessment:  Severe OSA He has not been reevaluated over the past 5 years.  Reports increasing daytime somnolence and an episode of syncope recently.  This could be worsening sleep apnea as he reports increasing weight.  No clear evidence of parasomnia, RLS or narcolepsy. WIll revaluate with repeat sleep study with MSLT. Study has been ordered and is pending    Lung nodule.  Incidental finding on CT abd.  PET scan reviewed with patient which shows low-grade activity.  We discussed going for a biopsy or following with a repeat CT scan.  We have decided to do a repeat CT in  6 months.  If there is any change in size or appearance then will assess for a biopsy at that point.  Plan/Recommendations: - Sleep study with MSLT - Follow up CT in 6 months  Marshell Garfinkel MD [facility] Pager [phone] 11/22/2016, 10:21 AM  CC: Marletta Lor, MD

## 2016-11-23 ENCOUNTER — Other Ambulatory Visit: Payer: Self-pay

## 2016-11-23 ENCOUNTER — Encounter (HOSPITAL_BASED_OUTPATIENT_CLINIC_OR_DEPARTMENT_OTHER): Payer: Self-pay | Admitting: *Deleted

## 2016-11-23 ENCOUNTER — Other Ambulatory Visit: Payer: Self-pay | Admitting: Urology

## 2016-11-23 DIAGNOSIS — C61 Malignant neoplasm of prostate: Secondary | ICD-10-CM

## 2016-11-23 LAB — ANGIOTENSIN CONVERTING ENZYME: Angiotensin-Converting Enzyme: 52 U/L (ref 9–67)

## 2016-11-23 NOTE — Progress Notes (Signed)
NPO AFTER MN.  ARRIVE AT 5520.  NEEDS ISTAT.  CURRENT EKG IN CHART AND Epic.  WILL TAKE NORVASC AND ZETIA AM DOS W/ SIPS OF WATER.  WILL BRING CPAP.

## 2016-12-01 ENCOUNTER — Ambulatory Visit (HOSPITAL_BASED_OUTPATIENT_CLINIC_OR_DEPARTMENT_OTHER): Payer: BC Managed Care – PPO | Admitting: Anesthesiology

## 2016-12-01 ENCOUNTER — Encounter (HOSPITAL_BASED_OUTPATIENT_CLINIC_OR_DEPARTMENT_OTHER)
Admission: RE | Disposition: A | Discharge status: Home / Self Care | Payer: Self-pay | Source: Ambulatory Visit | Attending: Urology

## 2016-12-01 ENCOUNTER — Encounter (HOSPITAL_BASED_OUTPATIENT_CLINIC_OR_DEPARTMENT_OTHER): Payer: Self-pay

## 2016-12-01 ENCOUNTER — Ambulatory Visit (HOSPITAL_BASED_OUTPATIENT_CLINIC_OR_DEPARTMENT_OTHER)
Admission: RE | Admit: 2016-12-01 | Discharge: 2016-12-01 | Disposition: A | Discharge status: Home / Self Care | Payer: BC Managed Care – PPO | Source: Ambulatory Visit | Attending: Urology | Admitting: Urology

## 2016-12-01 ENCOUNTER — Ambulatory Visit (HOSPITAL_COMMUNITY): Payer: BC Managed Care – PPO

## 2016-12-01 DIAGNOSIS — G4733 Obstructive sleep apnea (adult) (pediatric): Secondary | ICD-10-CM | POA: Insufficient documentation

## 2016-12-01 DIAGNOSIS — E785 Hyperlipidemia, unspecified: Secondary | ICD-10-CM | POA: Diagnosis present

## 2016-12-01 DIAGNOSIS — Z8546 Personal history of malignant neoplasm of prostate: Secondary | ICD-10-CM

## 2016-12-01 DIAGNOSIS — Z8042 Family history of malignant neoplasm of prostate: Secondary | ICD-10-CM

## 2016-12-01 DIAGNOSIS — Z8249 Family history of ischemic heart disease and other diseases of the circulatory system: Secondary | ICD-10-CM

## 2016-12-01 DIAGNOSIS — I10 Essential (primary) hypertension: Secondary | ICD-10-CM | POA: Diagnosis present

## 2016-12-01 DIAGNOSIS — I25110 Atherosclerotic heart disease of native coronary artery with unstable angina pectoris: Secondary | ICD-10-CM | POA: Diagnosis not present

## 2016-12-01 DIAGNOSIS — Z7982 Long term (current) use of aspirin: Secondary | ICD-10-CM | POA: Insufficient documentation

## 2016-12-01 DIAGNOSIS — Z79899 Other long term (current) drug therapy: Secondary | ICD-10-CM

## 2016-12-01 DIAGNOSIS — Z9989 Dependence on other enabling machines and devices: Secondary | ICD-10-CM | POA: Insufficient documentation

## 2016-12-01 DIAGNOSIS — Z8371 Family history of colonic polyps: Secondary | ICD-10-CM

## 2016-12-01 DIAGNOSIS — C61 Malignant neoplasm of prostate: Secondary | ICD-10-CM | POA: Diagnosis present

## 2016-12-01 DIAGNOSIS — Z955 Presence of coronary angioplasty implant and graft: Secondary | ICD-10-CM | POA: Insufficient documentation

## 2016-12-01 DIAGNOSIS — I214 Non-ST elevation (NSTEMI) myocardial infarction: Principal | ICD-10-CM | POA: Diagnosis present

## 2016-12-01 DIAGNOSIS — Z7902 Long term (current) use of antithrombotics/antiplatelets: Secondary | ICD-10-CM | POA: Insufficient documentation

## 2016-12-01 DIAGNOSIS — I43 Cardiomyopathy in diseases classified elsewhere: Secondary | ICD-10-CM | POA: Diagnosis present

## 2016-12-01 DIAGNOSIS — I2510 Atherosclerotic heart disease of native coronary artery without angina pectoris: Secondary | ICD-10-CM

## 2016-12-01 DIAGNOSIS — Z8052 Family history of malignant neoplasm of bladder: Secondary | ICD-10-CM

## 2016-12-01 DIAGNOSIS — R079 Chest pain, unspecified: Secondary | ICD-10-CM | POA: Diagnosis not present

## 2016-12-01 HISTORY — PX: GOLD SEED IMPLANT: SHX6343

## 2016-12-01 HISTORY — DX: Presence of coronary angioplasty implant and graft: Z95.5

## 2016-12-01 HISTORY — DX: Personal history of other specified conditions: Z87.898

## 2016-12-01 HISTORY — DX: Personal history of traumatic brain injury: Z87.820

## 2016-12-01 HISTORY — DX: Dependence on other enabling machines and devices: Z99.89

## 2016-12-01 HISTORY — DX: Presence of spectacles and contact lenses: Z97.3

## 2016-12-01 HISTORY — DX: Benign prostatic hyperplasia with lower urinary tract symptoms: N40.1

## 2016-12-01 HISTORY — PX: SPACE OAR INSTILLATION: SHX6769

## 2016-12-01 HISTORY — DX: Solitary pulmonary nodule: R91.1

## 2016-12-01 HISTORY — DX: Malignant neoplasm of prostate: C61

## 2016-12-01 HISTORY — DX: Obstructive sleep apnea (adult) (pediatric): G47.33

## 2016-12-01 LAB — POCT I-STAT 4, (NA,K, GLUC, HGB,HCT)
Glucose, Bld: 97 mg/dL (ref 65–99)
HCT: 45 % (ref 39.0–52.0)
Hemoglobin: 15.3 g/dL (ref 13.0–17.0)
Potassium: 4 mmol/L (ref 3.5–5.1)
Sodium: 144 mmol/L (ref 135–145)

## 2016-12-01 SURGERY — INSERTION, GOLD SEEDS
Anesthesia: General

## 2016-12-01 MED ORDER — FENTANYL CITRATE (PF) 100 MCG/2ML IJ SOLN
25.0000 ug | INTRAMUSCULAR | Status: DC | PRN
  Filled 2016-12-01: qty 1

## 2016-12-01 MED ORDER — LACTATED RINGERS IV SOLN
INTRAVENOUS | Status: DC
  Administered 2016-12-01: 09:00:00 via INTRAVENOUS
  Filled 2016-12-01: qty 1000

## 2016-12-01 MED ORDER — SODIUM CHLORIDE FLUSH 0.9 % IV SOLN
INTRAVENOUS | Status: DC | PRN
  Administered 2016-12-01: 10 mL via INTRAVENOUS

## 2016-12-01 MED ORDER — PROPOFOL 10 MG/ML IV BOLUS
INTRAVENOUS | Status: DC | PRN
  Administered 2016-12-01: 200 mg via INTRAVENOUS

## 2016-12-01 MED ORDER — KETOROLAC TROMETHAMINE 30 MG/ML IJ SOLN
INTRAMUSCULAR | Status: DC | PRN
  Administered 2016-12-01: 15 mg via INTRAVENOUS

## 2016-12-01 MED ORDER — CEFAZOLIN SODIUM-DEXTROSE 2-4 GM/100ML-% IV SOLN
INTRAVENOUS | Status: AC
  Filled 2016-12-01: qty 100

## 2016-12-01 MED ORDER — ONDANSETRON HCL 4 MG/2ML IJ SOLN
INTRAMUSCULAR | Status: AC
  Filled 2016-12-01: qty 2

## 2016-12-01 MED ORDER — FENTANYL CITRATE (PF) 100 MCG/2ML IJ SOLN
INTRAMUSCULAR | Status: AC
  Filled 2016-12-01: qty 2

## 2016-12-01 MED ORDER — MIDAZOLAM HCL 5 MG/5ML IJ SOLN
INTRAMUSCULAR | Status: DC | PRN
  Administered 2016-12-01: 2 mg via INTRAVENOUS

## 2016-12-01 MED ORDER — KETOROLAC TROMETHAMINE 30 MG/ML IJ SOLN
INTRAMUSCULAR | Status: AC
  Filled 2016-12-01: qty 1

## 2016-12-01 MED ORDER — LIDOCAINE 2% (20 MG/ML) 5 ML SYRINGE
INTRAMUSCULAR | Status: DC | PRN
  Administered 2016-12-01: 100 mg via INTRAVENOUS

## 2016-12-01 MED ORDER — DEXAMETHASONE SODIUM PHOSPHATE 10 MG/ML IJ SOLN
INTRAMUSCULAR | Status: AC
  Filled 2016-12-01: qty 1

## 2016-12-01 MED ORDER — ONDANSETRON HCL 4 MG/2ML IJ SOLN
4.0000 mg | Freq: Once | INTRAMUSCULAR | Status: AC | PRN
  Administered 2016-12-01: 4 mg via INTRAVENOUS
  Filled 2016-12-01: qty 2

## 2016-12-01 MED ORDER — CEFAZOLIN SODIUM-DEXTROSE 2-4 GM/100ML-% IV SOLN
2.0000 g | INTRAVENOUS | Status: AC
  Administered 2016-12-01: 2 g via INTRAVENOUS
  Filled 2016-12-01: qty 100

## 2016-12-01 MED ORDER — MIDAZOLAM HCL 2 MG/2ML IJ SOLN
INTRAMUSCULAR | Status: AC
  Filled 2016-12-01: qty 2

## 2016-12-01 MED ORDER — DEXAMETHASONE SODIUM PHOSPHATE 10 MG/ML IJ SOLN
INTRAMUSCULAR | Status: DC | PRN
  Administered 2016-12-01: 10 mg via INTRAVENOUS

## 2016-12-01 MED ORDER — FENTANYL CITRATE (PF) 100 MCG/2ML IJ SOLN
INTRAMUSCULAR | Status: DC | PRN
  Administered 2016-12-01: 25 ug via INTRAVENOUS

## 2016-12-01 MED ORDER — LIDOCAINE 2% (20 MG/ML) 5 ML SYRINGE
INTRAMUSCULAR | Status: AC
  Filled 2016-12-01: qty 5

## 2016-12-01 MED ORDER — PROPOFOL 10 MG/ML IV BOLUS
INTRAVENOUS | Status: AC
  Filled 2016-12-01: qty 20

## 2016-12-01 SURGICAL SUPPLY — 15 items
COVER TABLE BACK 60X90 (DRAPES) ×3 IMPLANT
DRSG TEGADERM 4X4.75 (GAUZE/BANDAGES/DRESSINGS) ×4 IMPLANT
DRSG TEGADERM 8X12 (GAUZE/BANDAGES/DRESSINGS) ×6 IMPLANT
GAUZE SPONGE 4X4 12PLY STRL LF (GAUZE/BANDAGES/DRESSINGS) ×2 IMPLANT
GLOVE BIO SURGEON STRL SZ8 (GLOVE) ×3 IMPLANT
GLOVE ECLIPSE 8.0 STRL XLNG CF (GLOVE) ×3 IMPLANT
GOWN STRL REUS W/TWL XL LVL3 (GOWN DISPOSABLE) ×3 IMPLANT
IMPL SPACEOAR SYSTEM 10ML (MISCELLANEOUS) ×1 IMPLANT
IMPLANT SPACEOAR SYSTEM 10ML (MISCELLANEOUS) ×3
KIT RM TURNOVER CYSTO AR (KITS) ×3 IMPLANT
MARKER GOLD PRELOAD 1.2X3 (Urological Implant) ×1 IMPLANT
SEED GOLD PRELOAD 1.2X3 (Urological Implant) ×3 IMPLANT
SURGILUBE 2OZ TUBE FLIPTOP (MISCELLANEOUS) ×3 IMPLANT
UNDERPAD 30X30 (UNDERPADS AND DIAPERS) ×6 IMPLANT
UNDERPAD 30X30 INCONTINENT (UNDERPADS AND DIAPERS) ×6 IMPLANT

## 2016-12-01 NOTE — Discharge Instructions (Signed)
°  Post Anesthesia Home Care Instructions  Activity: Get plenty of rest for the remainder of the day. A responsible individual must stay with you for 24 hours following the procedure.  For the next 24 hours, DO NOT: -Drive a car -Paediatric nurse -Drink alcoholic beverages -Take any medication unless instructed by your physician -Make any legal decisions or sign important papers.  Meals: Start with liquid foods such as gelatin or soup. Progress to regular foods as tolerated. Avoid greasy, spicy, heavy foods. If nausea and/or vomiting occur, drink only clear liquids until the nausea and/or vomiting subsides. Call your physician if vomiting continues.  Special Instructions/Symptoms: Your throat may feel dry or sore from the anesthesia or the breathing tube placed in your throat during surgery. If this causes discomfort, gargle with warm salt water. The discomfort should disappear within 24 hours.  If you had a scopolamine patch placed behind your ear for the management of post- operative nausea and/or vomiting:  1. The medication in the patch is effective for 72 hours, after which it should be removed.  Wrap patch in a tissue and discard in the trash. Wash hands thoroughly with soap and water. 2. You may remove the patch earlier than 72 hours if you experience unpleasant side effects which may include dry mouth, dizziness or visual disturbances. 3. Avoid touching the patch. Wash your hands with soap and water after contact with the patch.  Call your surgeon if you experience:   1.  Fever over 101.0. 2.  Inability to urinate. 3.  Nausea and/or vomiting. 4.  Extreme swelling or bruising at the surgical site. 5.  Continued bleeding from the incision. 6.  Increased pain, redness or drainage from the incision. 7.  Problems related to your pain medication. 8.  Any problems and/or concerns   You may remove perineal dressing tonight and leave open to air.  Limit your activities today, but  you may get back to normal routine activities tomorrow  Report any fever or difficulty urinating  Tylenol or Advil is fine to take for discomfort; however, No advil, aleve, motrin or other nsaids until 4 pm today.  It is okay to get back on all regular medications, including blood thinners, tomorrow

## 2016-12-01 NOTE — Transfer of Care (Signed)
Immediate Anesthesia Transfer of Care Note  Patient: Manuel Barker  Procedure(s) Performed: GOLD SEED IMPLANT (N/A ) SPACE OAR INSTILLATION (N/A )  Patient Location: PACU  Anesthesia Type:General  Level of Consciousness: sedated and responds to stimulation  Airway & Oxygen Therapy: Patient Spontanous Breathing and Patient connected to nasal cannula oxygen  Post-op Assessment: Report given to RN  Post vital signs: Reviewed and stable  Last Vitals: 125/81, 74, 18, 100%, 97.7 Vitals:   12/01/16 0818  BP: 127/80  Pulse: 71  Resp: 16  Temp: 36.6 C  SpO2: 100%    Last Pain:  Vitals:   12/01/16 0818  TempSrc: Oral      Patients Stated Pain Goal: 5 (34/28/76 8115)  Complications: No apparent anesthesia complications

## 2016-12-01 NOTE — Op Note (Signed)
Preoperative diagnosis: Clinical stage TI C adenocarcinoma the prostate   Postoperative diagnosis: Same   Procedure: Placement of gold fiducial markers, placement of Space OAR  Surgeon: Lillette Boxer. Keyri Salberg M.D.  Radiation Oncologist: Tyler Pita, M.D.  Anesthesia: Gen.   Indications: Patient  was diagnosed with clinical stage TI C prostate cancer. We had extensive discussion with him about treatment options versus. He elected to proceed with proton beam therapy in [place], [place].  The treating radiation oncologist there has asked that we place fiducial markers and space oar here in [place].  He is here to have that performed with myself and Dr. Tammi Klippel.  Technique and findings: Patient was brought the operating room where he had successful induction of general anesthesia. He was placed in dorso-lithotomy position and prepped and draped in usual manner. Appropriate surgical timeout was performed. Radiation oncology department placed a transrectal ultrasound probe anchoring stand.  The prostate was imaged both in sagittal and axial planes.  3 fiducial markers were placed.  To these markers were placed in the right prostatic lobe, 1 at the base, one towards the apex, both approximately 1 cm from the posterior margin with the rectum.  A third fiducial marker was placed in the left lateral prostate.  Axial and sagittal images were used to guide these fiducial marker placement.   I then proceeded with placement of SpaceOARby introducing a needle with the bevel angled inferiorly approximately 2 cm superior to the anus. This was angled downward and under direct ultrasound was placed within the space between the prostatic capsule and rectum. This was confirmed with a small amount of sterile saline injected and this was performed under direct ultrasound. I then attached the SpaceOARto the needle and injected this in the space between the prostate and rectum with good placement noted.   The  patient was brought to recovery room in stable condition, having tolerated the procedure well.Marland Kitchen
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
## 2016-12-01 NOTE — Anesthesia Procedure Notes (Signed)
Procedure Name: LMA Insertion Date/Time: 12/01/2016 10:28 AM Performed by: Bonney Aid, CRNA Pre-anesthesia Checklist: Patient identified, Emergency Drugs available, Suction available and Patient being monitored Patient Re-evaluated:Patient Re-evaluated prior to induction Oxygen Delivery Method: Circle system utilized Preoxygenation: Pre-oxygenation with 100% oxygen Induction Type: IV induction Ventilation: Mask ventilation without difficulty LMA: LMA inserted LMA Size: 5.0 Number of attempts: 1 Airway Equipment and Method: Bite block Placement Confirmation: positive ETCO2 Tube secured with: Tape Dental Injury: Teeth and Oropharynx as per pre-operative assessment

## 2016-12-01 NOTE — Anesthesia Preprocedure Evaluation (Addendum)
Anesthesia Evaluation  Patient identified by MRN, date of birth, ID band Patient awake    Reviewed: Allergy & Precautions, NPO status , Patient's Chart, lab work & pertinent test results  Airway Mallampati: IV  TM Distance: >3 FB Neck ROM: Limited    Dental  (+) Dental Advisory Given, Teeth Intact   Pulmonary sleep apnea and Continuous Positive Airway Pressure Ventilation ,    Pulmonary exam normal breath sounds clear to auscultation       Cardiovascular hypertension, Pt. on medications + CAD and + Cardiac Stents  Normal cardiovascular exam Rhythm:Regular Rate:Normal  EKG - SR with inferior and anterolateral TWI (seen on previous EKG as well)  TTE 2018 - Moderate LVH. EF was in the range of 65%   to 70%. Trivial MR, mild TR.  Myoperfusion 2018 -  Nuclear stress EF: 53%. Horizontal ST segment depression ST segment depression of 3 mm was noted during stress in the V4, V3 and V5 leads (ST depressions noted pre-stress, increased with stress) This is a low risk study. The left ventricular ejection fraction is normal (55-65%).   Neuro/Psych Seizures -,  negative psych ROS   GI/Hepatic negative GI ROS, Neg liver ROS, neg GERD  ,  Endo/Other  negative endocrine ROSneg diabetesObesity  Renal/GU negative Renal ROS   Prostate Cancer    Musculoskeletal negative musculoskeletal ROS (+)   Abdominal   Peds  Hematology negative hematology ROS (+)   Anesthesia Other Findings   Reproductive/Obstetrics                            Anesthesia Physical Anesthesia Plan  ASA: III  Anesthesia Plan: General   Post-op Pain Management:    Induction: Intravenous  PONV Risk Score and Plan: 2 and Treatment may vary due to age or medical condition, Dexamethasone and Ondansetron  Airway Management Planned: LMA  Additional Equipment: None  Intra-op Plan:   Post-operative Plan: Extubation in  OR  Informed Consent: I have reviewed the patients History and Physical, chart, labs and discussed the procedure including the risks, benefits and alternatives for the proposed anesthesia with the patient or authorized representative who has indicated his/her understanding and acceptance.   Dental advisory given  Plan Discussed with: CRNA  Anesthesia Plan Comments:         Anesthesia Quick Evaluation

## 2016-12-01 NOTE — Anesthesia Postprocedure Evaluation (Signed)
Anesthesia Post Note  Patient: HANDY MCLOUD  Procedure(s) Performed: GOLD SEED IMPLANT (N/A ) SPACE OAR INSTILLATION (N/A )     Patient location during evaluation: PACU Anesthesia Type: General Level of consciousness: awake and alert Pain management: pain level controlled Vital Signs Assessment: post-procedure vital signs reviewed and stable Respiratory status: spontaneous breathing, nonlabored ventilation and respiratory function stable Cardiovascular status: blood pressure returned to baseline and stable Postop Assessment: no apparent nausea or vomiting Anesthetic complications: no    Last Vitals:  Vitals:   12/01/16 1145 12/01/16 1200  BP: 119/80 125/87  Pulse: 63 63  Resp: 13 15  Temp:    SpO2: 100% 100%    Last Pain:  Vitals:   12/01/16 0818  TempSrc: Oral                 Audry Pili

## 2016-12-02 ENCOUNTER — Other Ambulatory Visit: Payer: Self-pay

## 2016-12-02 ENCOUNTER — Encounter (HOSPITAL_COMMUNITY): Payer: Self-pay | Admitting: *Deleted

## 2016-12-02 ENCOUNTER — Inpatient Hospital Stay (HOSPITAL_COMMUNITY)
Admission: EM | Admit: 2016-12-02 | Discharge: 2016-12-06 | DRG: 251 | Disposition: A | Discharge status: Home / Self Care | Payer: BC Managed Care – PPO | Attending: Internal Medicine | Admitting: Internal Medicine

## 2016-12-02 ENCOUNTER — Telehealth: Payer: Self-pay | Admitting: Internal Medicine

## 2016-12-02 ENCOUNTER — Encounter (HOSPITAL_BASED_OUTPATIENT_CLINIC_OR_DEPARTMENT_OTHER): Payer: Self-pay | Admitting: Urology

## 2016-12-02 ENCOUNTER — Emergency Department (HOSPITAL_COMMUNITY): Payer: BC Managed Care – PPO

## 2016-12-02 DIAGNOSIS — E785 Hyperlipidemia, unspecified: Secondary | ICD-10-CM | POA: Diagnosis not present

## 2016-12-02 DIAGNOSIS — I422 Other hypertrophic cardiomyopathy: Secondary | ICD-10-CM | POA: Diagnosis not present

## 2016-12-02 DIAGNOSIS — Z8042 Family history of malignant neoplasm of prostate: Secondary | ICD-10-CM | POA: Diagnosis not present

## 2016-12-02 DIAGNOSIS — Z8546 Personal history of malignant neoplasm of prostate: Secondary | ICD-10-CM | POA: Diagnosis not present

## 2016-12-02 DIAGNOSIS — C61 Malignant neoplasm of prostate: Secondary | ICD-10-CM | POA: Diagnosis not present

## 2016-12-02 DIAGNOSIS — I10 Essential (primary) hypertension: Secondary | ICD-10-CM

## 2016-12-02 DIAGNOSIS — I214 Non-ST elevation (NSTEMI) myocardial infarction: Secondary | ICD-10-CM | POA: Diagnosis not present

## 2016-12-02 DIAGNOSIS — Z955 Presence of coronary angioplasty implant and graft: Secondary | ICD-10-CM | POA: Diagnosis not present

## 2016-12-02 DIAGNOSIS — I25119 Atherosclerotic heart disease of native coronary artery with unspecified angina pectoris: Secondary | ICD-10-CM

## 2016-12-02 DIAGNOSIS — R079 Chest pain, unspecified: Secondary | ICD-10-CM | POA: Diagnosis not present

## 2016-12-02 DIAGNOSIS — Z8052 Family history of malignant neoplasm of bladder: Secondary | ICD-10-CM | POA: Diagnosis not present

## 2016-12-02 DIAGNOSIS — G4733 Obstructive sleep apnea (adult) (pediatric): Secondary | ICD-10-CM | POA: Diagnosis present

## 2016-12-02 DIAGNOSIS — Z8249 Family history of ischemic heart disease and other diseases of the circulatory system: Secondary | ICD-10-CM | POA: Diagnosis not present

## 2016-12-02 DIAGNOSIS — I25110 Atherosclerotic heart disease of native coronary artery with unstable angina pectoris: Secondary | ICD-10-CM | POA: Diagnosis not present

## 2016-12-02 DIAGNOSIS — Z8371 Family history of colonic polyps: Secondary | ICD-10-CM | POA: Diagnosis not present

## 2016-12-02 DIAGNOSIS — I119 Hypertensive heart disease without heart failure: Secondary | ICD-10-CM | POA: Diagnosis not present

## 2016-12-02 DIAGNOSIS — Z7982 Long term (current) use of aspirin: Secondary | ICD-10-CM | POA: Diagnosis not present

## 2016-12-02 DIAGNOSIS — I43 Cardiomyopathy in diseases classified elsewhere: Secondary | ICD-10-CM | POA: Diagnosis present

## 2016-12-02 LAB — CBC
HCT: 39.9 % (ref 39.0–52.0)
Hemoglobin: 13.6 g/dL (ref 13.0–17.0)
MCH: 29.2 pg (ref 26.0–34.0)
MCHC: 34.1 g/dL (ref 30.0–36.0)
MCV: 85.8 fL (ref 78.0–100.0)
Platelets: 212 10*3/uL (ref 150–400)
RBC: 4.65 MIL/uL (ref 4.22–5.81)
RDW: 14.3 % (ref 11.5–15.5)
WBC: 15.3 10*3/uL — ABNORMAL HIGH (ref 4.0–10.5)

## 2016-12-02 LAB — BASIC METABOLIC PANEL
Anion gap: 8 (ref 5–15)
BUN: 14 mg/dL (ref 6–20)
CO2: 23 mmol/L (ref 22–32)
Calcium: 9 mg/dL (ref 8.9–10.3)
Chloride: 106 mmol/L (ref 101–111)
Creatinine, Ser: 0.9 mg/dL (ref 0.61–1.24)
GFR calc Af Amer: >60 mL/min (ref >60)
GFR calc non Af Amer: >60 mL/min (ref >60)
Glucose, Bld: 113 mg/dL — ABNORMAL HIGH (ref 65–99)
Potassium: 3.8 mmol/L (ref 3.5–5.1)
Sodium: 137 mmol/L (ref 135–145)

## 2016-12-02 LAB — TROPONIN I: Troponin I: 0.08 ng/mL (ref <0.03)

## 2016-12-02 MED ORDER — ASPIRIN 81 MG PO CHEW
324.0000 mg | CHEWABLE_TABLET | Freq: Once | ORAL | Status: AC
  Administered 2016-12-03: 324 mg via ORAL
  Filled 2016-12-02: qty 4

## 2016-12-02 NOTE — ED Notes (Signed)
Dr. Laverta Baltimore notified of critical troponin of 0.08.  No new orders received.
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
## 2016-12-02 NOTE — Telephone Encounter (Signed)
Patient calling, states that he has a few questions.

## 2016-12-02 NOTE — ED Notes (Signed)
ED Provider at bedside. 
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
## 2016-12-02 NOTE — ED Triage Notes (Signed)
The pt has been having chest pain sob since yesterday  With exertion  He has been taken off his plavix when they seeded his prostate in the last few days.   He was just placed back on plavix

## 2016-12-02 NOTE — Telephone Encounter (Signed)
Called patient back who reports chest pain, not sharp accompanied by SOB.  Happened yesterday evening going up his steps, subsided after 2 hours, no NTG.  Today, during a 5 min walk he had the same chest pain and SOB.  Resolved again without NTG.  Reviewed with Dr. Harrington Challenger and advised patient that he should go to ER for evaluation.  Advised not to drive if he is actively having chest pain.  Pt agrees with this plan.  He had radioactive seeds and a gel spacer placed in his prostate yesterday and had general anesthesia for this procedure.

## 2016-12-02 NOTE — ED Notes (Signed)
Admitting MD at bedside.

## 2016-12-03 ENCOUNTER — Encounter (HOSPITAL_COMMUNITY): Payer: Self-pay | Admitting: Emergency Medicine

## 2016-12-03 DIAGNOSIS — E785 Hyperlipidemia, unspecified: Secondary | ICD-10-CM | POA: Insufficient documentation

## 2016-12-03 DIAGNOSIS — I422 Other hypertrophic cardiomyopathy: Secondary | ICD-10-CM

## 2016-12-03 DIAGNOSIS — I119 Hypertensive heart disease without heart failure: Secondary | ICD-10-CM

## 2016-12-03 DIAGNOSIS — C61 Malignant neoplasm of prostate: Secondary | ICD-10-CM

## 2016-12-03 DIAGNOSIS — I25110 Atherosclerotic heart disease of native coronary artery with unstable angina pectoris: Secondary | ICD-10-CM

## 2016-12-03 LAB — CBC
HCT: 42.7 % (ref 39.0–52.0)
Hemoglobin: 14 g/dL (ref 13.0–17.0)
MCH: 28.1 pg (ref 26.0–34.0)
MCHC: 32.8 g/dL (ref 30.0–36.0)
MCV: 85.7 fL (ref 78.0–100.0)
Platelets: 209 10*3/uL (ref 150–400)
RBC: 4.98 MIL/uL (ref 4.22–5.81)
RDW: 14.5 % (ref 11.5–15.5)
WBC: 12.5 10*3/uL — ABNORMAL HIGH (ref 4.0–10.5)

## 2016-12-03 LAB — LIPID PANEL
Cholesterol: 123 mg/dL (ref 0–200)
HDL: 51 mg/dL (ref >40)
LDL Cholesterol: 66 mg/dL (ref 0–99)
Total CHOL/HDL Ratio: 2.4 RATIO
Triglycerides: 29 mg/dL (ref <150)
VLDL: 6 mg/dL (ref 0–40)

## 2016-12-03 LAB — HEPARIN LEVEL (UNFRACTIONATED)
Heparin Unfractionated: 0.84 IU/mL — ABNORMAL HIGH (ref 0.30–0.70)
Heparin Unfractionated: 0.58 IU/mL (ref 0.30–0.70)

## 2016-12-03 LAB — BASIC METABOLIC PANEL
Anion gap: 8 (ref 5–15)
BUN: 10 mg/dL (ref 6–20)
CO2: 23 mmol/L (ref 22–32)
Calcium: 8.9 mg/dL (ref 8.9–10.3)
Chloride: 109 mmol/L (ref 101–111)
Creatinine, Ser: 0.83 mg/dL (ref 0.61–1.24)
GFR calc Af Amer: >60 mL/min (ref >60)
GFR calc non Af Amer: >60 mL/min (ref >60)
Glucose, Bld: 104 mg/dL — ABNORMAL HIGH (ref 65–99)
Potassium: 3.9 mmol/L (ref 3.5–5.1)
Sodium: 140 mmol/L (ref 135–145)

## 2016-12-03 LAB — TROPONIN I
Troponin I: 0.12 ng/mL (ref <0.03)
Troponin I: 0.13 ng/mL (ref <0.03)
Troponin I: 0.11 ng/mL (ref <0.03)

## 2016-12-03 MED ORDER — HEPARIN BOLUS VIA INFUSION
4000.0000 [IU] | Freq: Once | INTRAVENOUS | Status: AC
  Administered 2016-12-03: 4000 [IU] via INTRAVENOUS
  Filled 2016-12-03: qty 4000

## 2016-12-03 MED ORDER — HEPARIN (PORCINE) IN NACL 100-0.45 UNIT/ML-% IJ SOLN
1100.0000 [IU]/h | INTRAMUSCULAR | Status: DC
  Administered 2016-12-03: 1100 [IU]/h via INTRAVENOUS
  Filled 2016-12-03: qty 250

## 2016-12-03 MED ORDER — NITROGLYCERIN 0.4 MG SL SUBL: 0.4000 mg | SUBLINGUAL_TABLET | SUBLINGUAL | Status: DC | PRN

## 2016-12-03 MED ORDER — ONDANSETRON HCL 4 MG/2ML IJ SOLN: 4.0000 mg | Freq: Four times a day (QID) | INTRAMUSCULAR | Status: DC | PRN

## 2016-12-03 MED ORDER — ACETAMINOPHEN 325 MG PO TABS: 650.0000 mg | ORAL_TABLET | ORAL | Status: DC | PRN

## 2016-12-03 MED ORDER — IRBESARTAN 75 MG PO TABS
150.0000 mg | ORAL_TABLET | Freq: Every day | ORAL | Status: DC
  Administered 2016-12-03 – 2016-12-06 (×4): 150 mg via ORAL
  Filled 2016-12-03 (×4): qty 1
  Filled 2016-12-03: qty 2

## 2016-12-03 MED ORDER — METOPROLOL SUCCINATE ER 25 MG PO TB24
25.0000 mg | ORAL_TABLET | Freq: Every day | ORAL | Status: DC
  Administered 2016-12-03 – 2016-12-06 (×4): 25 mg via ORAL
  Filled 2016-12-03 (×5): qty 1

## 2016-12-03 MED ORDER — HEPARIN (PORCINE) IN NACL 100-0.45 UNIT/ML-% IJ SOLN
800.0000 [IU]/h | INTRAMUSCULAR | Status: DC
  Administered 2016-12-03 (×2): 950 [IU]/h via INTRAVENOUS
  Filled 2016-12-03 (×2): qty 250

## 2016-12-03 MED ORDER — EZETIMIBE 10 MG PO TABS
10.0000 mg | ORAL_TABLET | Freq: Every day | ORAL | Status: DC
  Administered 2016-12-03 – 2016-12-06 (×4): 10 mg via ORAL
  Filled 2016-12-03 (×5): qty 1

## 2016-12-03 MED ORDER — ROSUVASTATIN CALCIUM 20 MG PO TABS
40.0000 mg | ORAL_TABLET | Freq: Every day | ORAL | Status: DC
  Administered 2016-12-03 – 2016-12-05 (×4): 40 mg via ORAL
  Filled 2016-12-03 (×2): qty 1
  Filled 2016-12-03: qty 2
  Filled 2016-12-03 (×3): qty 1

## 2016-12-03 MED ORDER — CLOPIDOGREL BISULFATE 75 MG PO TABS
75.0000 mg | ORAL_TABLET | Freq: Every day | ORAL | Status: DC
  Administered 2016-12-03 – 2016-12-05 (×3): 75 mg via ORAL
  Filled 2016-12-03 (×3): qty 1

## 2016-12-03 MED ORDER — FLUTICASONE PROPIONATE 50 MCG/ACT NA SUSP
2.0000 | Freq: Every day | NASAL | Status: DC
  Administered 2016-12-03 – 2016-12-05 (×3): 2 via NASAL
  Filled 2016-12-03: qty 16

## 2016-12-03 MED ORDER — AMLODIPINE BESYLATE 5 MG PO TABS
5.0000 mg | ORAL_TABLET | Freq: Every day | ORAL | Status: DC
  Administered 2016-12-03 – 2016-12-06 (×4): 5 mg via ORAL
  Filled 2016-12-03 (×4): qty 1

## 2016-12-03 MED ORDER — ASPIRIN 81 MG PO CHEW
81.0000 mg | CHEWABLE_TABLET | Freq: Every day | ORAL | Status: DC
  Administered 2016-12-03 – 2016-12-06 (×4): 81 mg via ORAL
  Filled 2016-12-03 (×4): qty 1

## 2016-12-03 NOTE — ED Notes (Signed)
Attempted report 

## 2016-12-03 NOTE — Plan of Care (Signed)
Patient admitted to [facility] from Emergency Department. VSS.  Patient denies chest pain or shortness of breath.  Heparin infusing at 9.5 mls/hr.  Awaiting cardiac cath this admission per patient.

## 2016-12-03 NOTE — Progress Notes (Signed)
Progress Note  Patient Name: Manuel Barker Date of Encounter: 12/03/2016  Primary Cardiologist: Harrington Challenger  Subjective   No further angina at rest or when walking to the bathroom. Troponin I has peaked at 0.13. ECG shows LVH with very prominent secondary repolarization changes in the anterolateral and inferior leads and is therefore nondiagnostic.  It does not appear changed from the ECG on September 20.  Inpatient Medications    Scheduled Meds: . amLODipine  5 mg Oral Daily  . aspirin  81 mg Oral Daily  . clopidogrel  75 mg Oral Daily  . ezetimibe  10 mg Oral Daily  . irbesartan  150 mg Oral Daily  . metoprolol succinate  25 mg Oral Daily  . rosuvastatin  40 mg Oral q1800   Continuous Infusions: . heparin 1,100 Units/hr (12/03/16 0313)   PRN Meds: acetaminophen, nitroGLYCERIN, ondansetron (ZOFRAN) IV   Vital Signs    Vitals:   12/03/16 0500 12/03/16 0623 12/03/16 0700 12/03/16 0822  BP: (!) 136/94 125/86 133/83 128/73  Pulse: (!) 58 69 67   Resp: 15 16 19  (!) 21  Temp:      TempSrc:      SpO2: 99% 96% 97%   Weight:      Height:       No intake or output data in the 24 hours ending 12/03/16 0854 Filed Weights   12/02/16 1905  Weight: 201 lb (91.2 kg)    Telemetry    Normal sinus rhythm- Personally Reviewed  ECG    Normal sinus rhythm, LVH, prominent repolarization changes with ST depression and deep T wave inversion in the inferior and anterolateral leads- Personally Reviewed  Physical Exam  Comfortable lying fully flat in bed GEN: No acute distress.  Strabismus Neck: No JVD Cardiac: RRR, no murmurs, rubs, or gallops.  Respiratory: Clear to auscultation bilaterally. GI: Soft, nontender, non-distended  MS: No edema; No deformity. Neuro:  Nonfocal  Psych: Normal affect   Labs    Chemistry Recent Labs  Lab 12/01/16 0857 12/02/16 1942 12/03/16 0539  NA 144 137 140  K 4.0 3.8 3.9  CL  --  106 109  CO2  --  23 23  GLUCOSE 97 113* 104*  BUN  --   14 10  CREATININE  --  0.90 0.83  CALCIUM  --  9.0 8.9  GFRNONAA  --  >60 >60  GFRAA  --  >60 >60  ANIONGAP  --  8 8     Hematology Recent Labs  Lab 12/01/16 0857 12/02/16 1942 12/03/16 0539  WBC  --  15.3* 12.5*  RBC  --  4.65 4.98  HGB 15.3 13.6 14.0  HCT 45.0 39.9 42.7  MCV  --  85.8 85.7  MCH  --  29.2 28.1  MCHC  --  34.1 32.8  RDW  --  14.3 14.5  PLT  --  212 209    Cardiac Enzymes Recent Labs  Lab 12/02/16 1942 12/03/16 0008 12/03/16 0539  TROPONINI 0.08* 0.12* 0.13*   No results for input(s): TROPIPOC in the last 168 hours.   BNPNo results for input(s): BNP, PROBNP in the last 168 hours.   DDimer No results for input(s): DDIMER in the last 168 hours.   Radiology    Dg Chest 2 View  Result Date: 12/02/2016 CLINICAL DATA:  Chest pain and shortness of breath with exertion since yesterday. EXAM: CHEST  2 VIEW COMPARISON:  10/31/2013. FINDINGS: Normal sized heart. Tortuous aorta. Clear lungs.  Mild thoracic spine degenerative changes. IMPRESSION: No acute abnormality. Electronically Signed   By: Claudie Revering M.D.   On: 12/02/2016 20:07    Cardiac Studies   Echo October 03, 2016 - Left ventricle: The cavity size was normal. Wall thickness was   increased in a pattern of moderate LVH. Systolic function was   vigorous. The estimated ejection fraction was in the range of 65%   to 70%.  Nuclear stress test September 26, 2016  Nuclear stress EF: 53%.  Horizontal ST segment depression ST segment depression of 3 mm was noted during stress in the V4, V3 and V5 leads.  This is a low risk study.  The left ventricular ejection fraction is normal (55-65%).   1. EF 53% with diffuse mild hypokinesis.  2. Baseline ST depression and deep T wave inversions in multiple leads.  ST depression worsened in leads V3-5 with up to 3 mm ST depression in these leads.  This is likely nonspecific given baseline abnormalities.  3. Reversible small, mild apical inferior perfusion  defect. Possible small area of ischemia.   Overall probably a low risk study.  Cannot rule out small area of apical inferior ischemia. I think that the ECG findings are nondiagnostic given the baseline ECG abnormalities.   Patient Profile     66 y.o. male with history of coronary artery disease and previous stenting in 2011, hypertension with moderate LVH, undergoing planning for proton therapy for prostate cancer, presenting with unstable angina  Assessment & Plan    1. CAD w [place]: No symptoms at rest.  On intravenous heparin.  Has restarted clopidogrel since yesterday.  Symptoms are highly compatible with an acute coronary syndrome.  High risk due to elevated biomarkers, nondiagnostic EKG, recommend that he stay on intravenous heparin and undergo cardiac catheterization on Monday.  I do not think it makes any sense to repeat his stress test. 2. HTN: With prominent changes of hypertrophic hypertensive cardiomyopathy on ECG and echo.  No evidence of diastolic heart failure. 3. HLP: Excellent lipid profile on current medical regimen. 4. Prostate Ca: He is planning to undergo proton therapy in [place].  Unfortunately he was scheduled for an MRI on Monday.  He will have to miss this.  He is also scheduled for a sleep study in [place] on Monday night.  For questions or updates, please contact [place] Please consult [email or web address] for contact info under Cardiology/STEMI.      Signed, Sanda Klein, MD  12/03/2016, 8:54 AM

## 2016-12-03 NOTE — ED Notes (Signed)
#  [id number], Kathline Magic, pt's wife would like to be contacted with bed assignment/any updates.

## 2016-12-03 NOTE — Progress Notes (Signed)
ANTICOAGULATION CONSULT NOTE - Follow Up Consult  Pharmacy Consult:  Heparin Indication: chest pain/ACS  No Known Allergies  Patient Measurements: Height: 5\' 6"  (167.6 cm) Weight: 201 lb (91.2 kg) IBW/kg (Calculated) : 63.8 Heparin Dosing Weight: 85 kg  Vital Signs: BP: 139/94 (12/01 1000) Pulse Rate: 58 (12/01 1000)  Labs: Recent Labs    12/01/16 0857 12/02/16 1942 12/03/16 0008 12/03/16 0539 12/03/16 0854  HGB 15.3 13.6  --  14.0  --   HCT 45.0 39.9  --  42.7  --   PLT  --  212  --  209  --   HEPARINUNFRC  --   --   --   --  0.84*  CREATININE  --  0.90  --  0.83  --   TROPONINI  --  0.08* 0.12* 0.13*  --     Estimated Creatinine Clearance: 92.6 mL/min (by C-G formula based on SCr of 0.83 mg/dL).     Assessment: Manuel Barker presented with chest pain and SOB.  Pharmacy consulted to manage IV heparin for NSTEMI.  Heparin level is supra-therapeutic; no bleeding reported.  No issue with heparin infusion that RN could remember, but he will verify and contact pharmacy should there be any.   Goal of Therapy:  Heparin level 0.3-0.7 units/ml Monitor platelets by anticoagulation protocol: Yes    Plan:  Reduce heparin gtt to 950 units/hr Check 6 hr heparin level Daily heparin level and CBC   Harsimran Westman D. Mina Marble, PharmD, BCPS Pager:  [phone] 12/03/2016, 12:26 PM

## 2016-12-03 NOTE — ED Notes (Addendum)
Admitting at bedside 

## 2016-12-03 NOTE — Progress Notes (Signed)
ANTICOAGULATION CONSULT NOTE - Follow Up Consult  Pharmacy Consult:  Heparin Indication: chest pain/ACS  No Known Allergies  Patient Measurements: Height: 5\' 6"  (167.6 cm) Weight: 198 lb 11.2 oz (90.1 kg) IBW/kg (Calculated) : 63.8 Heparin Dosing Weight: 85 kg  Vital Signs: Temp: 97.7 F (36.5 C) (12/01 1941) Temp Source: Oral (12/01 1941) BP: 112/62 (12/01 1941) Pulse Rate: 65 (12/01 1941)  Labs: Recent Labs    12/01/16 0857  12/02/16 1942 12/03/16 0008 12/03/16 0539 12/03/16 0854 12/03/16 1254 12/03/16 1904  HGB 15.3  --  13.6  --  14.0  --   --   --   HCT 45.0  --  39.9  --  42.7  --   --   --   PLT  --   --  212  --  209  --   --   --   HEPARINUNFRC  --   --   --   --   --  0.84*  --  0.58  CREATININE  --   --  0.90  --  0.83  --   --   --   TROPONINI  --    < > 0.08* 0.12* 0.13*  --  0.11*  --    < > = values in this interval not displayed.   Estimated Creatinine Clearance: 92 mL/min (by C-G formula based on SCr of 0.83 mg/dL).  Assessment: 3 YOM presented with chest pain and SOB.  Pharmacy consulted to manage IV heparin for NSTEMI.    Heparin level 0.58 after being supra-therapeutic earlier today; CBC WNL this morning, no bleeding or infusion issus documented  Goal of Therapy:  Heparin level 0.3-0.7 units/ml Monitor platelets by anticoagulation protocol: Yes   Plan:  Continue heparin gtt at 950 units/hr Daily heparin level and CBC Monitor for s/sx of bleeding  Georga Bora, PharmD Clinical Pharmacist 12/03/2016 8:14 PM

## 2016-12-03 NOTE — Plan of Care (Signed)
  Progressing Clinical Measurements: Ability to maintain clinical measurements within normal limits will improve 12/03/2016 2259 - Progressing by Ruben Im, RN Respiratory complications will improve 12/03/2016 2259 - Progressing by Ruben Im, RN Cardiovascular complication will be avoided 12/03/2016 2259 - Progressing by Ruben Im, RN Activity: Risk for activity intolerance will decrease 12/03/2016 2259 - Progressing by Ruben Im, RN Nutrition: Adequate nutrition will be maintained 12/03/2016 2259 - Progressing by Ruben Im, RN Elimination: Will not experience complications related to urinary retention 12/03/2016 2259 - Progressing by Ruben Im, RN Pain Managment: General experience of comfort will improve 12/03/2016 2259 - Progressing by Ruben Im, RN Safety: Ability to remain free from injury will improve 12/03/2016 2259 - Progressing by Ruben Im, RN Skin Integrity: Risk for impaired skin integrity will decrease 12/03/2016 2259 - Progressing by Ruben Im, RN
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
## 2016-12-04 LAB — HEPARIN LEVEL (UNFRACTIONATED)
Heparin Unfractionated: 1.03 IU/mL — ABNORMAL HIGH (ref 0.30–0.70)
Heparin Unfractionated: 0.41 IU/mL (ref 0.30–0.70)
Heparin Unfractionated: 0.24 IU/mL — ABNORMAL LOW (ref 0.30–0.70)

## 2016-12-04 LAB — CBC
HCT: 42.3 % (ref 39.0–52.0)
Hemoglobin: 13.7 g/dL (ref 13.0–17.0)
MCH: 28.1 pg (ref 26.0–34.0)
MCHC: 32.4 g/dL (ref 30.0–36.0)
MCV: 86.7 fL (ref 78.0–100.0)
Platelets: 213 10*3/uL (ref 150–400)
RBC: 4.88 MIL/uL (ref 4.22–5.81)
RDW: 14.7 % (ref 11.5–15.5)
WBC: 11.8 10*3/uL — ABNORMAL HIGH (ref 4.0–10.5)

## 2016-12-04 LAB — PROTIME-INR
INR: 0.98
Prothrombin Time: 12.9 seconds (ref 11.4–15.2)

## 2016-12-04 NOTE — Progress Notes (Signed)
Pt had 8 bt run of NSVT, no s/s, MD notified, will continue to monitor, thanks Buckner [place].

## 2016-12-04 NOTE — Progress Notes (Signed)
Progress Note  Patient Name: Manuel Barker Date of Encounter: 12/04/2016  Primary Cardiologist: Harrington Challenger  Subjective   No new episodes of angina.  Inpatient Medications    Scheduled Meds: . amLODipine  5 mg Oral Daily  . aspirin  81 mg Oral Daily  . clopidogrel  75 mg Oral Daily  . ezetimibe  10 mg Oral Daily  . fluticasone  2 spray Each Nare Daily  . irbesartan  150 mg Oral Daily  . metoprolol succinate  25 mg Oral Daily  . rosuvastatin  40 mg Oral q1800   Continuous Infusions: . heparin 750 Units/hr (12/04/16 0438)   PRN Meds: acetaminophen, nitroGLYCERIN, ondansetron (ZOFRAN) IV   Vital Signs    Vitals:   12/03/16 2358 12/04/16 0519 12/04/16 1029 12/04/16 1154  BP: 119/67 117/75 121/81 121/80  Pulse: 67 64 69 65  Resp: 18 18  20   Temp: 97.9 F (36.6 C) 97.8 F (36.6 C)  99 F (37.2 C)  TempSrc: Oral Oral  Oral  SpO2: 98% 97% 96% 98%  Weight:  198 lb 6.4 oz (90 kg)    Height:        Intake/Output Summary (Last 24 hours) at 12/04/2016 1449 Last data filed at 12/04/2016 1440 Gross per 24 hour  Intake 984.88 ml  Output 1626 ml  Net -641.12 ml   Filed Weights   12/02/16 1905 12/03/16 1526 12/04/16 0519  Weight: 201 lb (91.2 kg) 198 lb 11.2 oz (90.1 kg) 198 lb 6.4 oz (90 kg)    Telemetry    Normal sinus rhythm- Personally Reviewed  ECG    No new tracing- Personally Reviewed  Physical Exam  Looks comfortable GEN: No acute distress.  Strabismus Neck: No JVD Cardiac: RRR, no murmurs, rubs, or gallops.  Respiratory: Clear to auscultation bilaterally. GI: Soft, nontender, non-distended  MS: No edema; No deformity. Neuro:  Nonfocal  Psych: Normal affect   Labs    Chemistry Recent Labs  Lab 12/01/16 0857 12/02/16 1942 12/03/16 0539  NA 144 137 140  K 4.0 3.8 3.9  CL  --  106 109  CO2  --  23 23  GLUCOSE 97 113* 104*  BUN  --  14 10  CREATININE  --  0.90 0.83  CALCIUM  --  9.0 8.9  GFRNONAA  --  >60 >60  GFRAA  --  >60 >60  ANIONGAP  --   8 8     Hematology Recent Labs  Lab 12/02/16 1942 12/03/16 0539 12/04/16 0252  WBC 15.3* 12.5* 11.8*  RBC 4.65 4.98 4.88  HGB 13.6 14.0 13.7  HCT 39.9 42.7 42.3  MCV 85.8 85.7 86.7  MCH 29.2 28.1 28.1  MCHC 34.1 32.8 32.4  RDW 14.3 14.5 14.7  PLT 212 209 213    Cardiac Enzymes Recent Labs  Lab 12/02/16 1942 12/03/16 0008 12/03/16 0539 12/03/16 1254  TROPONINI 0.08* 0.12* 0.13* 0.11*   No results for input(s): TROPIPOC in the last 168 hours.   BNPNo results for input(s): BNP, PROBNP in the last 168 hours.   DDimer No results for input(s): DDIMER in the last 168 hours.   Radiology    Dg Chest 2 View  Result Date: 12/02/2016 CLINICAL DATA:  Chest pain and shortness of breath with exertion since yesterday. EXAM: CHEST  2 VIEW COMPARISON:  10/31/2013. FINDINGS: Normal sized heart. Tortuous aorta. Clear lungs. Mild thoracic spine degenerative changes. IMPRESSION: No acute abnormality. Electronically Signed   By: Percell Locus.D.  On: 12/02/2016 20:07    Cardiac Studies    Echo October 03, 2016 - Left ventricle: The cavity size was normal. Wall thickness was increased in a pattern of moderate LVH. Systolic function was vigorous. The estimated ejection fraction was in the range of 65% to 70%.  Nuclear stress test September 26, 2016  Nuclear stress EF: 53%.  Horizontal ST segment depression ST segment depression of 3 mm was noted during stress in the V4, V3 and V5 leads.  This is a low risk study.  The left ventricular ejection fraction is normal (55-65%).  1. EF 53% with diffuse mild hypokinesis.  2. Baseline ST depression and deep T wave inversions in multiple leads. ST depression worsened in leads V3-5 with up to 3 mm ST depression in these leads. This is likely nonspecific given baseline abnormalities.  3. Reversible small, mild apical inferior perfusion defect. Possible small area of ischemia.   Overall probably a low risk study. Cannot rule  out small area of apical inferior ischemia. I think that the ECG findings are nondiagnostic given the baseline ECG abnormalities.     Patient Profile     66 y.o. male with history of coronary artery disease and previous stenting in 2011, hypertension with moderate LVH, undergoing planning for proton therapy for prostate cancer, presenting with unstable angina, after being off the clopidogrel for a medical procedure.  Recent nuclear stress test with a small area of apical inferior ischemia, preserved left ventricular systolic function, abnormal cardiac enzymes and nondiagnostic ECG due to baseline repolarization changes secondary to LVH.  Assessment & Plan    1. CAD w [place]: No symptoms at rest.  On intravenous heparin.  Has restarted clopidogrel since Friday.  Symptoms are highly compatible with an acute coronary syndrome.  High risk due to elevated biomarkers, nondiagnostic EKG. Cardiac catheterization on Monday.  I do not think it makes any sense to repeat his stress test. This procedure has been fully reviewed with the patient and written informed consent has been obtained. 2. HTN: Excellent blood pressure control With prominent changes of hypertrophic hypertensive cardiomyopathy on ECG and echo.  No evidence of diastolic heart failure. 3. HLP: Excellent lipid profile on current medical regimen. 4. Prostate Ca: He is planning to undergo proton therapy in [place].  Unfortunately he was scheduled for an MRI on Monday.  He will have to miss this.  He is also scheduled for a sleep study in [place] on Monday night. 5. Suspected OSA: He is scheduled for a sleep study tomorrow night, which would have to be rescheduled if he has a percutaneous revascularization procedure.    For questions or updates, please contact [place] Please consult [email or web address] for contact info under Cardiology/STEMI.      Signed, Sanda Klein, MD  12/04/2016, 2:49 PM

## 2016-12-04 NOTE — Plan of Care (Signed)
Patient without concern or complaint during 7 a to 7 p shift, no complaints of chest pain or shortness of breath.  Patient signed consent for cardiac cath 12/05/16 and verbalizes understanding he is nothing by mouth after midnight.  Wife and daughter at bedside.

## 2016-12-04 NOTE — Progress Notes (Signed)
ANTICOAGULATION CONSULT NOTE - Follow Up Consult  Pharmacy Consult:  Heparin Indication: chest pain/ACS  No Known Allergies  Patient Measurements: Height: 5\' 6"  (167.6 cm) Weight: 198 lb 6.4 oz (90 kg) IBW/kg (Calculated) : 63.8 Heparin Dosing Weight: 85 kg  Vital Signs: Temp: 99 F (37.2 C) (12/02 1154) Temp Source: Oral (12/02 1154) BP: 121/80 (12/02 1154) Pulse Rate: 65 (12/02 1154)  Labs: Recent Labs    12/02/16 1942 12/03/16 0008 12/03/16 0539  12/03/16 1254 12/03/16 1904 12/04/16 0252 12/04/16 1100  HGB 13.6  --  14.0  --   --   --  13.7  --   HCT 39.9  --  42.7  --   --   --  42.3  --   PLT 212  --  209  --   --   --  213  --   HEPARINUNFRC  --   --   --    < >  --  0.58 1.03* 0.41  CREATININE 0.90  --  0.83  --   --   --   --   --   TROPONINI 0.08* 0.12* 0.13*  --  0.11*  --   --   --    < > = values in this interval not displayed.   Estimated Creatinine Clearance: 92 mL/min (by C-G formula based on SCr of 0.83 mg/dL).  Assessment: 68 YOM presented with chest pain and SOB.  Pharmacy consulted to manage IV heparin for NSTEMI.    Heparin level 0.41 after being supra-therapeutic earlier today; CBC remains WNL this morning, no bleeding or infusion issus documented  Goal of Therapy:  Heparin level 0.3-0.7 units/ml Monitor platelets by anticoagulation protocol: Yes   Plan:  Continue heparin gtt at 750 units/hr Daily heparin level and CBC Monitor for s/sx of bleeding  Georga Bora, PharmD Clinical Pharmacist 12/04/2016 1:41 PM

## 2016-12-04 NOTE — Progress Notes (Signed)
Patient has home machine and water at bedside. Patient will place self on when ready. RT will continue to monitor.

## 2016-12-04 NOTE — Progress Notes (Signed)
ANTICOAGULATION CONSULT NOTE - Follow Up Consult  Pharmacy Consult:  Heparin Indication: chest pain/ACS  No Known Allergies  Patient Measurements: Height: 5\' 6"  (167.6 cm) Weight: 198 lb 6.4 oz (90 kg) IBW/kg (Calculated) : 63.8 Heparin Dosing Weight: 85 kg  Vital Signs: Temp: 99 F (37.2 C) (12/02 1154) Temp Source: Oral (12/02 1154) BP: 121/80 (12/02 1154) Pulse Rate: 65 (12/02 1154)  Labs: Recent Labs    12/02/16 1942 12/03/16 0008 12/03/16 0539  12/03/16 1254  12/04/16 0252 12/04/16 1100 12/04/16 1828  HGB 13.6  --  14.0  --   --   --  13.7  --   --   HCT 39.9  --  42.7  --   --   --  42.3  --   --   PLT 212  --  209  --   --   --  213  --   --   HEPARINUNFRC  --   --   --    < >  --    < > 1.03* 0.41 0.24*  CREATININE 0.90  --  0.83  --   --   --   --   --   --   TROPONINI 0.08* 0.12* 0.13*  --  0.11*  --   --   --   --    < > = values in this interval not displayed.   Estimated Creatinine Clearance: 92 mL/min (by C-G formula based on SCr of 0.83 mg/dL).  Assessment: 59 YOM presented with chest pain and SOB.  Pharmacy consulted to manage IV heparin for NSTEMI.    Heparin level is slightly subtherapeutic this evening. No issues with infusion or sxs of bleeding.   Goal of Therapy:  Heparin level 0.3-0.7 units/ml Monitor platelets by anticoagulation protocol: Yes   Plan:  1. Increase heparin infusion to 800 units/hr  2. Heparin level with am labs  3. Cath monday   Vincenza Hews, PharmD, BCPS 12/04/2016, 7:24 PM

## 2016-12-04 NOTE — Progress Notes (Signed)
ANTICOAGULATION CONSULT NOTE - Follow Up Consult  Pharmacy Consult for heparin Indication: chest pain/ACS  Labs: Recent Labs    12/02/16 1942 12/03/16 0008 12/03/16 0539 12/03/16 0854 12/03/16 1254 12/03/16 1904 12/04/16 0252  HGB 13.6  --  14.0  --   --   --  13.7  HCT 39.9  --  42.7  --   --   --  42.3  PLT 212  --  209  --   --   --  213  HEPARINUNFRC  --   --   --  0.84*  --  0.58 1.03*  CREATININE 0.90  --  0.83  --   --   --   --   TROPONINI 0.08* 0.12* 0.13*  --  0.11*  --   --     Assessment: 66yo male now above goal on heparin after one level at goal; lab drawn correctly per RN.  Goal of Therapy:  Heparin level 0.3-0.7 units/ml   Plan:  Will decrease heparin gtt by 2-3 units/kg/hr to 750 units/hr and check level in 6hr.  Wynona Neat, PharmD, BCPS  12/04/2016,4:37 AM

## 2016-12-05 ENCOUNTER — Encounter (HOSPITAL_COMMUNITY)
Admission: EM | Disposition: A | Discharge status: Home / Self Care | Payer: Self-pay | Source: Home / Self Care | Attending: Internal Medicine

## 2016-12-05 ENCOUNTER — Encounter (HOSPITAL_COMMUNITY): Payer: Self-pay | Admitting: Interventional Cardiology

## 2016-12-05 ENCOUNTER — Encounter (HOSPITAL_BASED_OUTPATIENT_CLINIC_OR_DEPARTMENT_OTHER): Payer: BC Managed Care – PPO

## 2016-12-05 HISTORY — PX: LEFT HEART CATH AND CORONARY ANGIOGRAPHY: CATH118249

## 2016-12-05 HISTORY — PX: CORONARY BALLOON ANGIOPLASTY: CATH118233

## 2016-12-05 HISTORY — PX: ULTRASOUND GUIDANCE FOR VASCULAR ACCESS: SHX6516

## 2016-12-05 LAB — CBC
HCT: 42.7 % (ref 39.0–52.0)
Hemoglobin: 14.1 g/dL (ref 13.0–17.0)
MCH: 28.8 pg (ref 26.0–34.0)
MCHC: 33 g/dL (ref 30.0–36.0)
MCV: 87.1 fL (ref 78.0–100.0)
Platelets: 227 10*3/uL (ref 150–400)
RBC: 4.9 MIL/uL (ref 4.22–5.81)
RDW: 14.9 % (ref 11.5–15.5)
WBC: 8.3 10*3/uL (ref 4.0–10.5)

## 2016-12-05 LAB — HEPARIN LEVEL (UNFRACTIONATED): Heparin Unfractionated: 0.37 IU/mL (ref 0.30–0.70)

## 2016-12-05 LAB — POCT ACTIVATED CLOTTING TIME: Activated Clotting Time: 499 seconds

## 2016-12-05 SURGERY — LEFT HEART CATH AND CORONARY ANGIOGRAPHY
Anesthesia: LOCAL

## 2016-12-05 MED ORDER — ASPIRIN 81 MG PO CHEW
81.0000 mg | CHEWABLE_TABLET | ORAL | Status: AC
  Administered 2016-12-05: 81 mg via ORAL
  Filled 2016-12-05: qty 1

## 2016-12-05 MED ORDER — CLOPIDOGREL BISULFATE 75 MG PO TABS
75.0000 mg | ORAL_TABLET | Freq: Every day | ORAL | Status: DC
  Administered 2016-12-06: 75 mg via ORAL
  Filled 2016-12-05: qty 1

## 2016-12-05 MED ORDER — MIDAZOLAM HCL 2 MG/2ML IJ SOLN
INTRAMUSCULAR | Status: AC
  Filled 2016-12-05: qty 2

## 2016-12-05 MED ORDER — IOPAMIDOL (ISOVUE-370) INJECTION 76%
INTRAVENOUS | Status: DC | PRN
  Administered 2016-12-05: 215 mL via INTRA_ARTERIAL

## 2016-12-05 MED ORDER — IOPAMIDOL (ISOVUE-370) INJECTION 76%
INTRAVENOUS | Status: AC
  Filled 2016-12-05: qty 50

## 2016-12-05 MED ORDER — VERAPAMIL HCL 2.5 MG/ML IV SOLN
INTRAVENOUS | Status: AC
  Filled 2016-12-05: qty 2

## 2016-12-05 MED ORDER — VERAPAMIL HCL 2.5 MG/ML IV SOLN
INTRAVENOUS | Status: DC | PRN
  Administered 2016-12-05: 2 mg via INTRA_ARTERIAL

## 2016-12-05 MED ORDER — LIDOCAINE HCL (PF) 1 % IJ SOLN
INTRAMUSCULAR | Status: AC
  Filled 2016-12-05: qty 30

## 2016-12-05 MED ORDER — MIDAZOLAM HCL 2 MG/2ML IJ SOLN
INTRAMUSCULAR | Status: DC | PRN
  Administered 2016-12-05: 2 mg via INTRAVENOUS

## 2016-12-05 MED ORDER — SODIUM CHLORIDE 0.9% FLUSH: 3.0000 mL | INTRAVENOUS | Status: DC | PRN

## 2016-12-05 MED ORDER — SODIUM CHLORIDE 0.9 % WEIGHT BASED INFUSION
3.0000 mL/kg/h | INTRAVENOUS | Status: DC
  Administered 2016-12-05: 3 mL/kg/h via INTRAVENOUS

## 2016-12-05 MED ORDER — BIVALIRUDIN TRIFLUOROACETATE 250 MG IV SOLR
INTRAVENOUS | Status: AC
  Filled 2016-12-05: qty 250

## 2016-12-05 MED ORDER — FENTANYL CITRATE (PF) 100 MCG/2ML IJ SOLN
INTRAMUSCULAR | Status: DC | PRN
  Administered 2016-12-05: 25 ug via INTRAVENOUS

## 2016-12-05 MED ORDER — SODIUM CHLORIDE 0.9 % IV SOLN
INTRAVENOUS | Status: DC | PRN
  Administered 2016-12-05: 1.75 mg/kg/h via INTRAVENOUS

## 2016-12-05 MED ORDER — ACETAMINOPHEN 325 MG PO TABS
650.0000 mg | ORAL_TABLET | ORAL | Status: DC | PRN
  Administered 2016-12-05: 650 mg via ORAL
  Filled 2016-12-05: qty 2

## 2016-12-05 MED ORDER — HYDRALAZINE HCL 20 MG/ML IJ SOLN: 5.0000 mg | INTRAMUSCULAR | Status: AC | PRN

## 2016-12-05 MED ORDER — HEPARIN SODIUM (PORCINE) 1000 UNIT/ML IJ SOLN
INTRAMUSCULAR | Status: DC | PRN
  Administered 2016-12-05: 4500 [IU] via INTRAVENOUS

## 2016-12-05 MED ORDER — SODIUM CHLORIDE 0.9 % WEIGHT BASED INFUSION
1.0000 mL/kg/h | INTRAVENOUS | Status: DC
  Administered 2016-12-05: 1 mL/kg/h via INTRAVENOUS

## 2016-12-05 MED ORDER — BIVALIRUDIN BOLUS VIA INFUSION - CUPID
INTRAVENOUS | Status: DC | PRN
  Administered 2016-12-05: 67.425 mg via INTRAVENOUS

## 2016-12-05 MED ORDER — HEPARIN (PORCINE) IN NACL 2-0.9 UNIT/ML-% IJ SOLN
INTRAMUSCULAR | Status: AC
Start: 2016-12-05 — End: 2016-12-05
  Filled 2016-12-05: qty 1000

## 2016-12-05 MED ORDER — SODIUM CHLORIDE 0.9% FLUSH
3.0000 mL | Freq: Two times a day (BID) | INTRAVENOUS | Status: DC
Start: 2016-12-05 — End: 2016-12-06
  Administered 2016-12-05: 19:00:00 3 mL via INTRAVENOUS

## 2016-12-05 MED ORDER — SODIUM CHLORIDE 0.9% FLUSH: 3.0000 mL | Freq: Two times a day (BID) | INTRAVENOUS | Status: DC

## 2016-12-05 MED ORDER — CLOPIDOGREL BISULFATE 300 MG PO TABS
ORAL_TABLET | ORAL | Status: AC
  Filled 2016-12-05: qty 2

## 2016-12-05 MED ORDER — IOPAMIDOL (ISOVUE-370) INJECTION 76%
INTRAVENOUS | Status: AC
  Filled 2016-12-05: qty 100

## 2016-12-05 MED ORDER — HEPARIN (PORCINE) IN NACL 2-0.9 UNIT/ML-% IJ SOLN
INTRAMUSCULAR | Status: AC | PRN
  Administered 2016-12-05: 1000 mL

## 2016-12-05 MED ORDER — SODIUM CHLORIDE 0.9 % IV SOLN: 250.0000 mL | INTRAVENOUS | Status: DC | PRN

## 2016-12-05 MED ORDER — HEPARIN SODIUM (PORCINE) 1000 UNIT/ML IJ SOLN
INTRAMUSCULAR | Status: AC
  Filled 2016-12-05: qty 1

## 2016-12-05 MED ORDER — VERAPAMIL HCL 2.5 MG/ML IV SOLN
INTRAVENOUS | Status: DC | PRN
  Administered 2016-12-05: 10 mL via INTRA_ARTERIAL

## 2016-12-05 MED ORDER — LIDOCAINE HCL (PF) 1 % IJ SOLN
INTRAMUSCULAR | Status: DC | PRN
  Administered 2016-12-05: 20 mL
  Administered 2016-12-05: 2 mL

## 2016-12-05 MED ORDER — CLOPIDOGREL BISULFATE 300 MG PO TABS
ORAL_TABLET | ORAL | Status: DC | PRN
  Administered 2016-12-05: 600 mg via ORAL

## 2016-12-05 MED ORDER — ASPIRIN 81 MG PO CHEW: 81.0000 mg | CHEWABLE_TABLET | Freq: Every day | ORAL | Status: DC

## 2016-12-05 MED ORDER — SODIUM CHLORIDE 0.9 % IV SOLN: INTRAVENOUS | Status: AC

## 2016-12-05 MED ORDER — LABETALOL HCL 5 MG/ML IV SOLN: 10.0000 mg | INTRAVENOUS | Status: AC | PRN

## 2016-12-05 MED ORDER — FENTANYL CITRATE (PF) 100 MCG/2ML IJ SOLN
INTRAMUSCULAR | Status: AC
  Filled 2016-12-05: qty 2

## 2016-12-05 MED ORDER — NITROGLYCERIN 1 MG/10 ML FOR IR/CATH LAB
INTRA_ARTERIAL | Status: DC | PRN
  Administered 2016-12-05: 200 ug via INTRACORONARY

## 2016-12-05 MED ORDER — ONDANSETRON HCL 4 MG/2ML IJ SOLN: 4.0000 mg | Freq: Four times a day (QID) | INTRAMUSCULAR | Status: DC | PRN

## 2016-12-05 SURGICAL SUPPLY — 26 items
BALLN EMERGE MR 3.0X8 (BALLOONS) ×3
BALLN WOLVERINE 2.50X10 (BALLOONS) ×3
BALLOON EMERGE MR 3.0X8 (BALLOONS) IMPLANT
BALLOON WOLVERINE 2.50X10 (BALLOONS) IMPLANT
CATH 5FR JL3.5 JR4 ANG PIG MP (CATHETERS) ×1 IMPLANT
CATH INFINITI 5 FR 3DRC (CATHETERS) ×1 IMPLANT
CATH INFINITI 5FR AL1 (CATHETERS) ×1 IMPLANT
CATH LAUNCHER 5F RADR (CATHETERS) IMPLANT
CATH LAUNCHER 6FR EBU3.5 (CATHETERS) ×1 IMPLANT
CATHETER LAUNCHER 5F RADR (CATHETERS) ×3
COVER PRB 48X5XTLSCP FOLD TPE (BAG) IMPLANT
COVER PROBE 5X48 (BAG) ×3
DEVICE RAD COMP TR BAND LRG (VASCULAR PRODUCTS) ×1 IMPLANT
GLIDESHEATH SLEND SS 6F .021 (SHEATH) ×1 IMPLANT
GUIDEWIRE INQWIRE 1.5J.035X260 (WIRE) IMPLANT
INQWIRE 1.5J .035X260CM (WIRE) ×3
KIT ENCORE 26 ADVANTAGE (KITS) ×1 IMPLANT
KIT HEART LEFT (KITS) ×3 IMPLANT
PACK CARDIAC CATHETERIZATION (CUSTOM PROCEDURE TRAY) ×3 IMPLANT
SHEATH PINNACLE 5F 10CM (SHEATH) ×1 IMPLANT
SHEATH PINNACLE 6F 10CM (SHEATH) ×1 IMPLANT
TRANSDUCER W/STOPCOCK (MISCELLANEOUS) ×3 IMPLANT
TUBING CIL FLEX 10 FLL-RA (TUBING) ×3 IMPLANT
VALVE GUARDIAN II ~~LOC~~ HEMO (MISCELLANEOUS) ×1 IMPLANT
WIRE ASAHI PROWATER 180CM (WIRE) ×1 IMPLANT
WIRE EMERALD 3MM-J .035X150CM (WIRE) ×1 IMPLANT

## 2016-12-05 NOTE — Progress Notes (Signed)
Site area: right groin  Site Prior to Removal:  Level 0  Pressure Applied For 20 MINUTES    Minutes Beginning at 1635  Manual:   Yes.    Patient Status During Pull:  Stable   Post Pull Groin Site:  Level 0  Post Pull Instructions Given:  Yes.    Post Pull Pulses Present:  Yes.    Dressing Applied:  Yes.    Comments:  Rechecked at 1700 and 1715 with no change No change in assessment at 1800 and 1830, dressing dry and intact   TR BAND REMOVAL  LOCATION:    right radial  DEFLATED PER PROTOCOL:    Yes.    TIME BAND OFF / DRESSING APPLIED:    1800  SITE UPON ARRIVAL:    Level 0  SITE AFTER BAND REMOVAL:    Level 0  CIRCULATION SENSATION AND MOVEMENT:    Within Normal Limits   Yes.    COMMENTS:   Rechecked site with no change in assessment at 1830, CSMs remain unchanged and pulse present

## 2016-12-05 NOTE — Interval H&P Note (Signed)
Cath Lab Visit (complete for each Cath Lab visit)  Clinical Evaluation Leading to the Procedure:   ACS: Yes.    Non-ACS:    Anginal Classification: CCS IV  Anti-ischemic medical therapy: Minimal Therapy (1 class of medications)  Non-Invasive Test Results: No non-invasive testing performed  Prior CABG: No previous CABG      History and Physical Interval Note:  12/05/2016 11:28 AM  Manuel Barker  has presented today for surgery, with the diagnosis of NSTEMI  The various methods of treatment have been discussed with the patient and family. After consideration of risks, benefits and other options for treatment, the patient has consented to  Procedure(s): LEFT HEART CATH AND CORONARY ANGIOGRAPHY (N/A) as a surgical intervention .  The patient's history has been reviewed, patient examined, no change in status, stable for surgery.  I have reviewed the patient's chart and labs.  Questions were answered to the patient's satisfaction.     Larae Grooms

## 2016-12-05 NOTE — H&P (View-Only) (Signed)
Progress Note  Patient Name: Manuel Barker Date of Encounter: 12/05/2016  Primary Cardiologist: Harrington Challenger  Subjective   Awaiting heart catheterization no chest pain, no shortness of breath  Inpatient Medications    Scheduled Meds: . amLODipine  5 mg Oral Daily  . aspirin  81 mg Oral Daily  . clopidogrel  75 mg Oral Daily  . ezetimibe  10 mg Oral Daily  . fluticasone  2 spray Each Nare Daily  . irbesartan  150 mg Oral Daily  . metoprolol succinate  25 mg Oral Daily  . rosuvastatin  40 mg Oral q1800  . sodium chloride flush  3 mL Intravenous Q12H   Continuous Infusions: . sodium chloride    . [START ON 12/06/2016] sodium chloride 3 mL/kg/hr (12/05/16 0630)   Followed by  . [START ON 12/06/2016] sodium chloride 1 mL/kg/hr (12/05/16 0738)  . heparin 800 Units/hr (12/04/16 1954)   PRN Meds: sodium chloride, acetaminophen, nitroGLYCERIN, ondansetron (ZOFRAN) IV, sodium chloride flush   Vital Signs    Vitals:   12/04/16 1029 12/04/16 1154 12/04/16 2000 12/05/16 0539  BP: 121/81 121/80 116/73 121/78  Pulse: 69 65 65 63  Resp:  20 18 18   Temp:  99 F (37.2 C) 98 F (36.7 C) 97.9 F (36.6 C)  TempSrc:  Oral Oral Oral  SpO2: 96% 98% 95% 96%  Weight:    198 lb 1.6 oz (89.9 kg)  Height:        Intake/Output Summary (Last 24 hours) at 12/05/2016 0936 Last data filed at 12/05/2016 0093 Gross per 24 hour  Intake 1324.68 ml  Output 1925 ml  Net -600.32 ml   Filed Weights   12/03/16 1526 12/04/16 0519 12/05/16 0539  Weight: 198 lb 11.2 oz (90.1 kg) 198 lb 6.4 oz (90 kg) 198 lb 1.6 oz (89.9 kg)    Telemetry    Normal sinus rhythm, no changes- Personally Reviewed  ECG    No new tracing- Personally Reviewed  Physical Exam  GEN: Well nourished, well developed, in no acute distress  HEENT: Right eye abnormality noted Neck: no JVD, carotid bruits, or masses Cardiac: RRR; no murmurs, rubs, or gallops,no edema  Respiratory:  clear to auscultation bilaterally, normal work  of breathing GI: soft, nontender, nondistended, + BS MS: no deformity or atrophy  Skin: warm and dry, no rash Neuro:  Alert and Oriented x 3, Strength and sensation are intact Psych: euthymic mood, full affect   Labs    Chemistry Recent Labs  Lab 12/01/16 0857 12/02/16 1942 12/03/16 0539  NA 144 137 140  K 4.0 3.8 3.9  CL  --  106 109  CO2  --  23 23  GLUCOSE 97 113* 104*  BUN  --  14 10  CREATININE  --  0.90 0.83  CALCIUM  --  9.0 8.9  GFRNONAA  --  >60 >60  GFRAA  --  >60 >60  ANIONGAP  --  8 8     Hematology Recent Labs  Lab 12/03/16 0539 12/04/16 0252 12/05/16 0518  WBC 12.5* 11.8* PENDING  RBC 4.98 4.88 4.90  HGB 14.0 13.7 14.1  HCT 42.7 42.3 42.7  MCV 85.7 86.7 87.1  MCH 28.1 28.1 28.8  MCHC 32.8 32.4 33.0  RDW 14.5 14.7 14.9  PLT 209 213 227    Cardiac Enzymes Recent Labs  Lab 12/02/16 1942 12/03/16 0008 12/03/16 0539 12/03/16 1254  TROPONINI 0.08* 0.12* 0.13* 0.11*   No results for input(s): TROPIPOC in the  last 168 hours.   BNPNo results for input(s): BNP, PROBNP in the last 168 hours.   DDimer No results for input(s): DDIMER in the last 168 hours.   Radiology    No results found.  Cardiac Studies    Echo October 03, 2016 - Left ventricle: The cavity size was normal. Wall thickness was increased in a pattern of moderate LVH. Systolic function was vigorous. The estimated ejection fraction was in the range of 65% to 70%.  Nuclear stress test September 26, 2016  Nuclear stress EF: 53%.  Horizontal ST segment depression ST segment depression of 3 mm was noted during stress in the V4, V3 and V5 leads.  This is a low risk study.  The left ventricular ejection fraction is normal (55-65%).  1. EF 53% with diffuse mild hypokinesis.  2. Baseline ST depression and deep T wave inversions in multiple leads. ST depression worsened in leads V3-5 with up to 3 mm ST depression in these leads. This is likely nonspecific given  baseline abnormalities.  3. Reversible small, mild apical inferior perfusion defect. Possible small area of ischemia.   Overall probably a low risk study. Cannot rule out small area of apical inferior ischemia. I think that the ECG findings are nondiagnostic given the baseline ECG abnormalities.     Patient Profile     66 y.o. male with history of coronary artery disease and previous stenting in 2011, hypertension with moderate LVH, undergoing planning for proton therapy for prostate cancer, presenting with unstable angina, after being off the clopidogrel for a medical procedure.  Recent nuclear stress test with a small area of apical inferior ischemia, preserved left ventricular systolic function, abnormal cardiac enzymes and nondiagnostic ECG due to baseline repolarization changes secondary to LVH.  Assessment & Plan    1. CAD w [place]: No symptoms at rest.  On intravenous heparin.  Has restarted clopidogrel since last Friday.  Symptoms are highly compatible with an acute coronary syndrome.  High risk due to elevated biomarkers, nondiagnostic EKG. Cardiac catheterization today.  This procedure has been fully reviewed with the patient and written informed consent has been obtained.  He reminded me that he had 3 different stents placed. 2. HTN: Excellent blood pressure control with prominent changes of hypertrophic hypertensive cardiomyopathy on ECG and echo.  No evidence of diastolic heart failure.  Stable, medications reviewed 3. HLP: Excellent lipid profile on current medical regimen.  Stable, medicines reviewed 4. Prostate Ca: He is planning to undergo proton therapy in [place].  Unfortunately he was scheduled for an MRI today.  He will have to miss this.  He is also scheduled for a sleep study in [place] on tonight. 5. Suspected OSA: He is scheduled for a sleep study night, this has been rescheduled.  Cath today.  For questions or updates, please contact [place] Please  consult [email or web address] for contact info under Cardiology/STEMI.      Signed, Candee Furbish, MD  12/05/2016, 9:36 AM

## 2016-12-05 NOTE — Progress Notes (Signed)
patient has his home unit CPAP in room but stated that he does not want to use it tonight. Pt is stable at this time no distress or complications noted

## 2016-12-05 NOTE — Progress Notes (Addendum)
Progress Note  Patient Name: Manuel Barker Date of Encounter: 12/05/2016  Primary Cardiologist: Harrington Challenger  Subjective   Awaiting heart catheterization no chest pain, no shortness of breath  Inpatient Medications    Scheduled Meds: . amLODipine  5 mg Oral Daily  . aspirin  81 mg Oral Daily  . clopidogrel  75 mg Oral Daily  . ezetimibe  10 mg Oral Daily  . fluticasone  2 spray Each Nare Daily  . irbesartan  150 mg Oral Daily  . metoprolol succinate  25 mg Oral Daily  . rosuvastatin  40 mg Oral q1800  . sodium chloride flush  3 mL Intravenous Q12H   Continuous Infusions: . sodium chloride    . [START ON 12/06/2016] sodium chloride 3 mL/kg/hr (12/05/16 0630)   Followed by  . [START ON 12/06/2016] sodium chloride 1 mL/kg/hr (12/05/16 0738)  . heparin 800 Units/hr (12/04/16 1954)   PRN Meds: sodium chloride, acetaminophen, nitroGLYCERIN, ondansetron (ZOFRAN) IV, sodium chloride flush   Vital Signs    Vitals:   12/04/16 1029 12/04/16 1154 12/04/16 2000 12/05/16 0539  BP: 121/81 121/80 116/73 121/78  Pulse: 69 65 65 63  Resp:  20 18 18   Temp:  99 F (37.2 C) 98 F (36.7 C) 97.9 F (36.6 C)  TempSrc:  Oral Oral Oral  SpO2: 96% 98% 95% 96%  Weight:    198 lb 1.6 oz (89.9 kg)  Height:        Intake/Output Summary (Last 24 hours) at 12/05/2016 0936 Last data filed at 12/05/2016 4259 Gross per 24 hour  Intake 1324.68 ml  Output 1925 ml  Net -600.32 ml   Filed Weights   12/03/16 1526 12/04/16 0519 12/05/16 0539  Weight: 198 lb 11.2 oz (90.1 kg) 198 lb 6.4 oz (90 kg) 198 lb 1.6 oz (89.9 kg)    Telemetry    Normal sinus rhythm, no changes- Personally Reviewed  ECG    No new tracing- Personally Reviewed  Physical Exam  GEN: Well nourished, well developed, in no acute distress  HEENT: Right eye abnormality noted Neck: no JVD, carotid bruits, or masses Cardiac: RRR; no murmurs, rubs, or gallops,no edema  Respiratory:  clear to auscultation bilaterally, normal work  of breathing GI: soft, nontender, nondistended, + BS MS: no deformity or atrophy  Skin: warm and dry, no rash Neuro:  Alert and Oriented x 3, Strength and sensation are intact Psych: euthymic mood, full affect   Labs    Chemistry Recent Labs  Lab 12/01/16 0857 12/02/16 1942 12/03/16 0539  NA 144 137 140  K 4.0 3.8 3.9  CL  --  106 109  CO2  --  23 23  GLUCOSE 97 113* 104*  BUN  --  14 10  CREATININE  --  0.90 0.83  CALCIUM  --  9.0 8.9  GFRNONAA  --  >60 >60  GFRAA  --  >60 >60  ANIONGAP  --  8 8     Hematology Recent Labs  Lab 12/03/16 0539 12/04/16 0252 12/05/16 0518  WBC 12.5* 11.8* PENDING  RBC 4.98 4.88 4.90  HGB 14.0 13.7 14.1  HCT 42.7 42.3 42.7  MCV 85.7 86.7 87.1  MCH 28.1 28.1 28.8  MCHC 32.8 32.4 33.0  RDW 14.5 14.7 14.9  PLT 209 213 227    Cardiac Enzymes Recent Labs  Lab 12/02/16 1942 12/03/16 0008 12/03/16 0539 12/03/16 1254  TROPONINI 0.08* 0.12* 0.13* 0.11*   No results for input(s): TROPIPOC in the  last 168 hours.   BNPNo results for input(s): BNP, PROBNP in the last 168 hours.   DDimer No results for input(s): DDIMER in the last 168 hours.   Radiology    No results found.  Cardiac Studies    Echo October 03, 2016 - Left ventricle: The cavity size was normal. Wall thickness was increased in a pattern of moderate LVH. Systolic function was vigorous. The estimated ejection fraction was in the range of 65% to 70%.  Nuclear stress test September 26, 2016  Nuclear stress EF: 53%.  Horizontal ST segment depression ST segment depression of 3 mm was noted during stress in the V4, V3 and V5 leads.  This is a low risk study.  The left ventricular ejection fraction is normal (55-65%).  1. EF 53% with diffuse mild hypokinesis.  2. Baseline ST depression and deep T wave inversions in multiple leads. ST depression worsened in leads V3-5 with up to 3 mm ST depression in these leads. This is likely nonspecific given  baseline abnormalities.  3. Reversible small, mild apical inferior perfusion defect. Possible small area of ischemia.   Overall probably a low risk study. Cannot rule out small area of apical inferior ischemia. I think that the ECG findings are nondiagnostic given the baseline ECG abnormalities.     Patient Profile     66 y.o. male with history of coronary artery disease and previous stenting in 2011, hypertension with moderate LVH, undergoing planning for proton therapy for prostate cancer, presenting with unstable angina, after being off the clopidogrel for a medical procedure.  Recent nuclear stress test with a small area of apical inferior ischemia, preserved left ventricular systolic function, abnormal cardiac enzymes and nondiagnostic ECG due to baseline repolarization changes secondary to LVH.  Assessment & Plan    1. CAD w [place]: No symptoms at rest.  On intravenous heparin.  Has restarted clopidogrel since last Friday.  Symptoms are highly compatible with an acute coronary syndrome.  High risk due to elevated biomarkers, nondiagnostic EKG. Cardiac catheterization today.  This procedure has been fully reviewed with the patient and written informed consent has been obtained.  He reminded me that he had 3 different stents placed. 2. HTN: Excellent blood pressure control with prominent changes of hypertrophic hypertensive cardiomyopathy on ECG and echo.  No evidence of diastolic heart failure.  Stable, medications reviewed 3. HLP: Excellent lipid profile on current medical regimen.  Stable, medicines reviewed 4. Prostate Ca: He is planning to undergo proton therapy in [place].  Unfortunately he was scheduled for an MRI today.  He will have to miss this.  He is also scheduled for a sleep study in [place] on tonight. 5. Suspected OSA: He is scheduled for a sleep study night, this has been rescheduled.  Cath today.  For questions or updates, please contact [place] Please  consult [email or web address] for contact info under Cardiology/STEMI.      Signed, Candee Furbish, MD  12/05/2016, 9:36 AM

## 2016-12-06 ENCOUNTER — Encounter (HOSPITAL_BASED_OUTPATIENT_CLINIC_OR_DEPARTMENT_OTHER): Payer: BC Managed Care – PPO

## 2016-12-06 LAB — CBC
HCT: 42.7 % (ref 39.0–52.0)
Hemoglobin: 14 g/dL (ref 13.0–17.0)
MCH: 28.1 pg (ref 26.0–34.0)
MCHC: 32.8 g/dL (ref 30.0–36.0)
MCV: 85.6 fL (ref 78.0–100.0)
Platelets: 194 10*3/uL (ref 150–400)
RBC: 4.99 MIL/uL (ref 4.22–5.81)
RDW: 14.4 % (ref 11.5–15.5)
WBC: 8.4 10*3/uL (ref 4.0–10.5)

## 2016-12-06 LAB — BASIC METABOLIC PANEL
Anion gap: 8 (ref 5–15)
BUN: 10 mg/dL (ref 6–20)
CO2: 24 mmol/L (ref 22–32)
Calcium: 9 mg/dL (ref 8.9–10.3)
Chloride: 104 mmol/L (ref 101–111)
Creatinine, Ser: 0.96 mg/dL (ref 0.61–1.24)
GFR calc Af Amer: >60 mL/min (ref >60)
GFR calc non Af Amer: >60 mL/min (ref >60)
Glucose, Bld: 108 mg/dL — ABNORMAL HIGH (ref 65–99)
Potassium: 4.3 mmol/L (ref 3.5–5.1)
Sodium: 136 mmol/L (ref 135–145)

## 2016-12-06 MED ORDER — CLOPIDOGREL BISULFATE 75 MG PO TABS: 75.0000 mg | ORAL_TABLET | Freq: Every day | ORAL | 3 refills | Status: DC

## 2016-12-06 MED ORDER — METOPROLOL SUCCINATE ER 25 MG PO TB24: 25.0000 mg | ORAL_TABLET | Freq: Every day | ORAL | 3 refills | Status: DC

## 2016-12-06 NOTE — Progress Notes (Signed)
CARDIAC REHAB PHASE I   PRE:  Rate/Rhythm: 75 SR    BP: sitting 143/80    SaO2:   MODE:  Ambulation: 1000 ft   POST:  Rate/Rhythm: 87 SR    BP: sitting 133/84     SaO2:   Tolerated well, no c/o. Ed completed with good reception. Pt has questions regarding antiplatelet therapy and his upcoming proton tx. Will refer to [place] but might wait till after his prostate tx.  [place], ACSM 12/06/2016 8:59 AM

## 2016-12-06 NOTE — Care Management Note (Addendum)
Case Management Note  Patient Details  Name: Manuel Barker MRN: [id number] Date of Birth: 06/21/1950  Subjective/Objective:  From home,pta indep s/p coronary balloon angioplasy, will be on plavix.                   Action/Plan: DC home today, no needs.   Expected Discharge Date:                  Expected Discharge Plan:  Home/Self Care  In-House Referral:     Discharge planning Services  CM Consult  Post Acute Care Choice:    Choice offered to:     DME Arranged:    DME Agency:     HH Arranged:    [facility] Agency:     Status of Service:  Completed, signed off  If discussed at [employer] of Stay Meetings, dates discussed:    Additional Comments:  Zenon Mayo, RN 12/06/2016, 9:37 AM

## 2016-12-06 NOTE — Discharge Summary (Signed)
Discharge Summary    Patient ID: Manuel Barker,  MRN: [id number], DOB/AGE: 1950/07/22 66 y.o.  Admit date: 12/02/2016 Discharge date: 12/06/2016  Primary Care Provider: Marletta Lor Primary Cardiologist: Dr. Harrington Challenger   Discharge Diagnoses    Active Problems:   Hypertension   Coronary artery disease involving native coronary artery of native heart with unstable angina pectoris [facility])   NSTEMI (non-ST elevated myocardial infarction) [facility])   Allergies No Known Allergies  Diagnostic Studies/Procedures   Echo October 03, 2016 - Left ventricle: The cavity size was normal. Wall thickness was increased in a pattern of moderate LVH. Systolic function was vigorous. The estimated ejection fraction was in the range of 65% to 70%.  Nuclear stress test September 26, 2016  Nuclear stress EF: 53%.  Horizontal ST segment depression ST segment depression of 3 mm was noted during stress in the V4, V3 and V5 leads.  This is a low risk study.  The left ventricular ejection fraction is normal (55-65%).  1. EF 53% with diffuse mild hypokinesis.  2. Baseline ST depression and deep T wave inversions in multiple leads. ST depression worsened in leads V3-5 with up to 3 mm ST depression in these leads. This is likely nonspecific given baseline abnormalities.  3. Reversible small, mild apical inferior perfusion defect. Possible small area of ischemia.   Overall probably a low risk study. Cannot rule out small area of apical inferior ischemia. I think that the ECG findings are nondiagnostic given the baseline ECG abnormalities.  LHC 12/05/16 Conclusion     Dist Cx lesion is 90% stenosed, instent restenosis.  Scoring balloon angioplasty was performed using a BALLOON WOLVERINE 2.50X10, followed by PTCA witha 3.0 semicompliant balloon.  Post intervention, there is a 0% residual stenosis.  Ost Cx to Prox Cx lesion is 70% stenosed.  Scoring balloon angioplasty was  performed using a BALLOON WOLVERINE 2.50X10, followed by PTCA witha 3.0 semicompliant balloon.  Post intervention, there is a 10% residual stenosis.  Prox LAD lesion is 50% stenosed.  Ost 1st Diag to 1st Diag lesion is 90% stenosed.  Prox RCA lesion is 50% stenosed.  Ost RPDA to RPDA lesion is 40% stenosed.  Dist LAD lesion is 90% stenosed, unchanged from prior.  The left ventricular ejection fraction is 50-55% by visual estimate.  There is no aortic valve stenosis.  The left ventricular systolic function is normal.  LV end diastolic pressure is normal.  Continue DAPT for minimum of 30 days.  Medical therapy for moderate disease in other vessels. If he continues to have discomfort, would consider FFR of RCA. Diagonal cannot be treated percutaneously , and appears similar to its appearance in 2011. LAD disease appears similar also to 2011.  Continue aggressive secondary prevention.   Tortuous right subclavian prevents easy catheter manipulation. Would use femoral approach in the future.       History of Present Illness     66 y.o.malewith history of coronary artery disease and previous stenting in 2011,hypertension with moderate LVH, undergoing planning for proton therapy for prostate cancer, presenting with unstable angina, after being off of clopidogrel for a medical procedure. Ruled in for NSTEMI.  Of note, pt had recent nuclear stress test 09/26/16 with a small area of apical inferior ischemia, preserved left ventricular systolic function, abnormal cardiac enzymes and nondiagnostic ECG due to baseline repolarization changes secondary to LVH.   Hospital Course     Pt was admitted to telemetry. Cardiac enzymes were cycled and +,  peaking at 0.13. He underwent LHC on 12/05/16 and was found to have 90% ISR in the distal LCx treated with balloon angioplasty, 70% ostial to prox LCx 70% stenosis s/p balloon angioplasty. Medical therapy recommended for moderate disease in  other vessels (see angiographic details above). If he continues to have angina, would consider FFR of the RCA. The diagonal cannot be treated percutaneously and appears similar to its appearance in 2011. The LAD is also unchanged from 2011. He tolerated the procedure well and left the cath lab in stable condition. He had no post cath complications. No recurrent CP or dyspnea. His cath sites remained stable as did renal function and vital signs. He was placed on DAPT w/ ASA and Plavix. DAPT is to be continued for a minimum of 30 days.  Can stop if needed after 30 days given need for treatment for prostate CA. He was also continued on metoprolol, Crestor and Zetia.   On 12/06/16 he was ambulating w/o difficulty and denied any recurrent symptoms. He was last seen and examined by Dr. Marlou Porch, who determined he was stable for discharge home. Post hospital f/u with be arranged with Dr. Harrington Challenger or an APP on her team in 1-2 weeks.    Consultants: none    Discharge Vitals Blood pressure 133/84, pulse 76, temperature 98.1 F (36.7 C), temperature source Oral, resp. rate (!) 9, height 5\' 6"  (1.676 m), weight 206 lb 5.6 oz (93.6 kg), SpO2 100 %.  Filed Weights   12/04/16 0519 12/05/16 0539 12/06/16 0529  Weight: 198 lb 6.4 oz (90 kg) 198 lb 1.6 oz (89.9 kg) 206 lb 5.6 oz (93.6 kg)    Labs & Radiologic Studies    CBC Recent Labs    12/05/16 0518 12/06/16 0515  WBC 8.3 8.4  HGB 14.1 14.0  HCT 42.7 42.7  MCV 87.1 85.6  PLT 227 213   Basic Metabolic Panel Recent Labs    12/06/16 0515  NA 136  K 4.3  CL 104  CO2 24  GLUCOSE 108*  BUN 10  CREATININE 0.96  CALCIUM 9.0   Liver Function Tests No results for input(s): AST, ALT, ALKPHOS, BILITOT, PROT, ALBUMIN in the last 72 hours. No results for input(s): LIPASE, AMYLASE in the last 72 hours. Cardiac Enzymes No results for input(s): CKTOTAL, CKMB, CKMBINDEX, TROPONINI in the last 72 hours. BNP Invalid input(s): POCBNP D-Dimer No results for  input(s): DDIMER in the last 72 hours. Hemoglobin A1C No results for input(s): HGBA1C in the last 72 hours. Fasting Lipid Panel No results for input(s): CHOL, HDL, LDLCALC, TRIG, CHOLHDL, LDLDIRECT in the last 72 hours. Thyroid Function Tests No results for input(s): TSH, T4TOTAL, T3FREE, THYROIDAB in the last 72 hours.  Invalid input(s): FREET3 _____________  Dg Chest 2 View  Result Date: 12/02/2016 CLINICAL DATA:  Chest pain and shortness of breath with exertion since yesterday. EXAM: CHEST  2 VIEW COMPARISON:  10/31/2013. FINDINGS: Normal sized heart. Tortuous aorta. Clear lungs. Mild thoracic spine degenerative changes. IMPRESSION: No acute abnormality. Electronically Signed   By: Claudie Revering M.D.   On: 12/02/2016 20:07   Nm Pet Image Initial (pi) Skull Base To Thigh  Result Date: 11/07/2016 CLINICAL DATA:  Initial treatment strategy for right lower lobe pulmonary nodule. EXAM: NUCLEAR MEDICINE PET SKULL BASE TO THIGH TECHNIQUE: 10.0 mCi F-18 FDG was injected intravenously. Full-ring PET imaging was performed from the skull base to thigh after the radiotracer. CT data was obtained and used for attenuation correction and anatomic  localization. FASTING BLOOD GLUCOSE:  Value: 111 mg/dl COMPARISON:  Abdomen CT on 08/01/2016 FINDINGS: NECK:  No hypermetabolic lymph nodes or masses. CHEST: No hypermetabolic masses or lymphadenopathy. Aortic and coronary artery atherosclerosis. Persistent poorly defined nodular opacity is seen in the posterior right lower lobe which shows central air bronchograms, and measures 1.7 x 1.0 cm on image 46/8. This shows low-grade metabolic activity, with SUV max of 2.4. No other pulmonary nodules or masses identified. No evidence of pleural effusion . ABDOMEN/PELVIS: No abnormal hypermetabolic activity within the liver, pancreas, adrenal glands, or spleen. No hypermetabolic lymph nodes in the abdomen or pelvis. SKELETON: No focal hypermetabolic bone lesions to suggest  skeletal metastasis. IMPRESSION: Persistent 1.7 x 1.0 cm nodular opacity in posterior right lower lobe with low-grade metabolic activity. Low-grade adenocarcinoma cannot be excluded. No evidence of local or distant metastatic disease. Electronically Signed   By: Earle Gell M.D.   On: 11/07/2016 09:21   Disposition   Pt is being discharged home today in good condition.  Follow-up Plans & Appointments    Follow-up Information    [name], MD Follow up.   Specialty:  Cardiology Why:  our office will call you with a hospital follow-up appointment  Contact information: [address] [place] [address] [phone]          Discharge Instructions    Amb Referral to Cardiac Rehabilitation   Complete by:  As directed    Diagnosis:   PTCA NSTEMI        Discharge Medications   Allergies as of 12/06/2016   No Known Allergies     Medication List    STOP taking these medications   atorvastatin 40 MG tablet Commonly known as:  LIPITOR   fluticasone 50 MCG/ACT nasal spray Commonly known as:  FLONASE     TAKE these medications   amLODipine 5 MG tablet Commonly known as:  NORVASC take 1 tablet by mouth once daily What changed:    how much to take  how to take this  when to take this Notes to patient:  Lowers blood pressure Decreases chest pain    aspirin 81 MG tablet Take 1 tablet (81 mg total) by mouth daily. Notes to patient:  Blood thinner Prevents clotting in the heart artery and heart attack   clopidogrel 75 MG tablet Commonly known as:  PLAVIX Take 1 tablet (75 mg total) by mouth daily. What changed:    how much to take  how to take this  when to take this Notes to patient:  Blood thinner Prevents clotting in the heart artery and heart attack   ezetimibe 10 MG tablet Commonly known as:  ZETIA Take 1 tablet (10 mg total) daily by mouth. What changed:  when to take this Notes to patient:  Cholesterol    metoprolol  succinate 25 MG 24 hr tablet Commonly known as:  TOPROL-XL Take 1 tablet (25 mg total) by mouth daily. Notes to patient:  Decreases work of the heart Lowers blood pressure and heart rate    nitroGLYCERIN 0.4 MG SL tablet Commonly known as:  NITROSTAT PLACE 1 TABLET UNDER THE TONGUE EVERY 5 MINUTES UP TO 3 DOSES IF NEEDED Notes to patient:  As needed for chest pain    rosuvastatin 40 MG tablet Commonly known as:  CRESTOR Take 1 tablet (40 mg total) daily by mouth. What changed:  when to take this Notes to patient:  Cholesterol  Take in place of  lipitor   telmisartan 40 MG tablet Commonly known as:  MICARDIS take 1 tablet by mouth once daily What changed:    how much to take  how to take this  when to take this Notes to patient:  Lowers blood pressure         Aspirin prescribed at discharge?  Yes High Intensity Statin Prescribed? (Lipitor 40-80mg  or Crestor 20-40mg ): Yes Beta Blocker Prescribed? Yes For EF <40%, was ACEI/ARB Prescribed? Yes ADP Receptor Inhibitor Prescribed? (i.e. Plavix etc.-Includes Medically Managed Patients): Yes For EF <40%, Aldosterone Inhibitor Prescribed? No: Was EF assessed during THIS hospitalization? Yes Was Cardiac Rehab II ordered? (Included Medically managed Patients): Yes   Outstanding Labs/Studies   None   Duration of Discharge Encounter   Greater than 30 minutes including physician time.  Signed, Lyda Jester  12/06/2016, 1:09 PM   Personally seen and examined. Agree with above. Circ ptca in 2 positions. Continue DAPT (30 days at least) prostate CA tx If worsening angina, consider RCA intervention Meds reviewed. Exam: alert, MAE, RRR, CTAB, soft abd, cath site normal.  Labs reviewed OK with DC. Candee Furbish, MD

## 2016-12-06 NOTE — Progress Notes (Signed)
Progress Note  Patient Name: Manuel Barker Date of Encounter: 12/06/2016  Primary Cardiologist: [name], MD   Subjective   Feeling well this morning. No complaints. Denies CP and dyspnea. Right radial and right femoral cath access sites stable. No groin, back or flank pain.   Inpatient Medications    Scheduled Meds: . amLODipine  5 mg Oral Daily  . aspirin  81 mg Oral Daily  . clopidogrel  75 mg Oral Q breakfast  . ezetimibe  10 mg Oral Daily  . fluticasone  2 spray Each Nare Daily  . irbesartan  150 mg Oral Daily  . metoprolol succinate  25 mg Oral Daily  . rosuvastatin  40 mg Oral q1800  . sodium chloride flush  3 mL Intravenous Q12H   Continuous Infusions: . sodium chloride     PRN Meds: sodium chloride, acetaminophen, nitroGLYCERIN, ondansetron (ZOFRAN) IV, sodium chloride flush   Vital Signs    Vitals:   12/05/16 2041 12/05/16 2200 12/06/16 0200 12/06/16 0529  BP:  111/72 122/84   Pulse: 67 (!) 57 60   Resp: 15 14 14    Temp:   97.8 F (36.6 C)   TempSrc:   Oral   SpO2: 96% 99% 96%   Weight:    206 lb 5.6 oz (93.6 kg)  Height:        Intake/Output Summary (Last 24 hours) at 12/06/2016 0818 Last data filed at 12/06/2016 0538 Gross per 24 hour  Intake 500 ml  Output 3150 ml  Net -2650 ml   Filed Weights   12/04/16 0519 12/05/16 0539 12/06/16 0529  Weight: 198 lb 6.4 oz (90 kg) 198 lb 1.6 oz (89.9 kg) 206 lb 5.6 oz (93.6 kg)    Telemetry    NSR - Personally Reviewed  ECG    NSR with TWI diffuse (accentuated but unchanged) - Personally Reviewed  Physical Exam   GEN: No acute distress.   Neck: No JVD Cardiac: RRR, no murmurs, rubs, or gallops.  Respiratory: Clear to auscultation bilaterally. GI: Soft, nontender, non-distended  MS: No edema; No deformity. Neuro:  Nonfocal  Psych: Normal affect  Groin: right cath site stable, soft, nontender   Labs    Chemistry Recent Labs  Lab 12/02/16 1942 12/03/16 0539 12/06/16 0515  NA 137 140  136  K 3.8 3.9 4.3  CL 106 109 104  CO2 23 23 24   GLUCOSE 113* 104* 108*  BUN 14 10 10   CREATININE 0.90 0.83 0.96  CALCIUM 9.0 8.9 9.0  GFRNONAA >60 >60 >60  GFRAA >60 >60 >60  ANIONGAP 8 8 8      Hematology Recent Labs  Lab 12/04/16 0252 12/05/16 0518 12/06/16 0515  WBC 11.8* 8.3 8.4  RBC 4.88 4.90 4.99  HGB 13.7 14.1 14.0  HCT 42.3 42.7 42.7  MCV 86.7 87.1 85.6  MCH 28.1 28.8 28.1  MCHC 32.4 33.0 32.8  RDW 14.7 14.9 14.4  PLT 213 227 194    Cardiac Enzymes Recent Labs  Lab 12/02/16 1942 12/03/16 0008 12/03/16 0539 12/03/16 1254  TROPONINI 0.08* 0.12* 0.13* 0.11*   No results for input(s): TROPIPOC in the last 168 hours.   BNPNo results for input(s): BNP, PROBNP in the last 168 hours.   DDimer No results for input(s): DDIMER in the last 168 hours.   Radiology    No results found.  Cardiac Studies   Echo October 03, 2016 - Left ventricle: The cavity size was normal. Wall thickness was increased in  a pattern of moderate LVH. Systolic function was vigorous. The estimated ejection fraction was in the range of 65% to 70%.  Nuclear stress test September 26, 2016  Nuclear stress EF: 53%.  Horizontal ST segment depression ST segment depression of 3 mm was noted during stress in the V4, V3 and V5 leads.  This is a low risk study.  The left ventricular ejection fraction is normal (55-65%).  1. EF 53% with diffuse mild hypokinesis.  2. Baseline ST depression and deep T wave inversions in multiple leads. ST depression worsened in leads V3-5 with up to 3 mm ST depression in these leads. This is likely nonspecific given baseline abnormalities.  3. Reversible small, mild apical inferior perfusion defect. Possible small area of ischemia.   Overall probably a low risk study. Cannot rule out small area of apical inferior ischemia. I think that the ECG findings are nondiagnostic given the baseline ECG abnormalities.  LHC 12/05/16 Conclusion      Dist Cx lesion is 90% stenosed, instent restenosis.  Scoring balloon angioplasty was performed using a BALLOON WOLVERINE 2.50X10, followed by PTCA witha 3.0 semicompliant balloon.  Post intervention, there is a 0% residual stenosis.  Ost Cx to Prox Cx lesion is 70% stenosed.  Scoring balloon angioplasty was performed using a BALLOON WOLVERINE 2.50X10, followed by PTCA witha 3.0 semicompliant balloon.  Post intervention, there is a 10% residual stenosis.  Prox LAD lesion is 50% stenosed.  Ost 1st Diag to 1st Diag lesion is 90% stenosed.  Prox RCA lesion is 50% stenosed.  Ost RPDA to RPDA lesion is 40% stenosed.  Dist LAD lesion is 90% stenosed, unchanged from prior.  The left ventricular ejection fraction is 50-55% by visual estimate.  There is no aortic valve stenosis.  The left ventricular systolic function is normal.  LV end diastolic pressure is normal.   Continue DAPT for minimum of 30 days.  Medical therapy for moderate disease in other vessels.  If he continues to have discomfort, would consider FFR of RCA.  Diagonal cannot be treated percutaneously , and appears similar to its appearance in 2011.  LAD disease appears similar also to 2011.  Continue aggressive secondary prevention.   Tortuous right subclavian prevents easy catheter manipulation.  Would use femoral approach in the future.    Patient Profile     66 y.o. male with history of coronary artery disease and previous stenting in 2011,hypertension with moderate LVH, undergoing planning for proton therapy for prostate cancer, presenting with unstable angina, after being off the clopidogrel for a medical procedure. Ruled in for NSTEMI.  Recent nuclear stress test with a small area of apical inferior ischemia, preserved left ventricular systolic function, abnormal cardiac enzymes and nondiagnostic ECG due to baseline repolarization changes secondary to LVH  Assessment & Plan    1. CAD/ NSTEMI:  s/p [facility] yesterday (complete details outlined above). Cath showed 90% ISR in the distal LCx treated with balloon angioplasty, 70% ostial to prox LCx 70% stenosis s/p balloon angioplasty. Medical therapy for moderate disease in other vessels. If he continues to have angina, would consider FFR of the RCA. The diagonal cannot be treated percutaneously and appears similar to its appearance in 2011. The LAD is also unchanged from 2011. He is stable w/o CP and dyspnea. Continue ASA, Plavix, BB and Zetia. DAPT is to be continued for a minimum of 30 days.  Prostate CA tx.  2. HTN: BP controlled on current therapy. Continue irbesartan, metoprolol, amlodipine   3. HLD:  LDL is at goal at 66 mg/sL. Continue Crestor and Zetia.   4. OSA: uses CPAP at home.   5. Prostate Cancer: undergoing planning for proton therapy  6. Post Cath: right radial and right femoral cath access sites stable. Renal function stable. Ambulate with cardiac rehab prior to discharge.  7. Dispo: likely d/c home today after MD sees. We will arrange f/u in our office.    For questions or updates, please contact [facility] Please consult [email or web address] for contact info under Cardiology/STEMI.      Signed, Lyda Jester, PA-C  12/06/2016, 8:18 AM    Personally seen and examined. Agree with above. Circ ptca in 2 positions. Continue DAPT (30 days at least) prostate CA tx If worsening angina, consider RCA intervention Meds reviewed. Exam: alert, MAE, RRR, CTAB, soft abd, cath site normal.  Labs reviewed OK with DC. Candee Furbish, MD

## 2016-12-07 ENCOUNTER — Telehealth (HOSPITAL_COMMUNITY): Payer: Self-pay

## 2016-12-07 NOTE — Telephone Encounter (Signed)
Patient's insurance is active and benefits verified through [employer] - No co-pay, deductible amount of $1,250/$1,250 has been met, out of pocket amount of $4,350/$4,350 has been met, no co-insurance, and no pre-authorization is required. Passport/reference [phone]  Patient's insurance is active and benefits verified through Medicare Part A & B - No co-pay, deductible amount of $183.00/$183.00 has been met, no out of pocket, 20% co-insurance, and no pre-authorization is required. Passport/reference [phone]  Patient will be contacted and scheduled after their follow up appt with the Cardiologist office upon review by the RN Navigator.

## 2016-12-12 ENCOUNTER — Ambulatory Visit: Payer: BC Managed Care – PPO | Admitting: Internal Medicine

## 2016-12-14 DIAGNOSIS — Z8042 Family history of malignant neoplasm of prostate: Secondary | ICD-10-CM | POA: Diagnosis not present

## 2016-12-14 DIAGNOSIS — Z8052 Family history of malignant neoplasm of bladder: Secondary | ICD-10-CM | POA: Diagnosis not present

## 2016-12-14 DIAGNOSIS — C61 Malignant neoplasm of prostate: Secondary | ICD-10-CM | POA: Diagnosis not present

## 2016-12-18 ENCOUNTER — Other Ambulatory Visit: Payer: Self-pay | Admitting: Internal Medicine

## 2016-12-21 DIAGNOSIS — Z87820 Personal history of traumatic brain injury: Secondary | ICD-10-CM | POA: Insufficient documentation

## 2016-12-21 DIAGNOSIS — N401 Enlarged prostate with lower urinary tract symptoms: Secondary | ICD-10-CM | POA: Insufficient documentation

## 2016-12-21 DIAGNOSIS — I2510 Atherosclerotic heart disease of native coronary artery without angina pectoris: Secondary | ICD-10-CM | POA: Insufficient documentation

## 2016-12-21 DIAGNOSIS — G4733 Obstructive sleep apnea (adult) (pediatric): Secondary | ICD-10-CM | POA: Insufficient documentation

## 2016-12-21 DIAGNOSIS — Z87898 Personal history of other specified conditions: Secondary | ICD-10-CM | POA: Insufficient documentation

## 2016-12-21 DIAGNOSIS — Z973 Presence of spectacles and contact lenses: Secondary | ICD-10-CM | POA: Insufficient documentation

## 2016-12-21 DIAGNOSIS — R911 Solitary pulmonary nodule: Secondary | ICD-10-CM | POA: Insufficient documentation

## 2016-12-21 DIAGNOSIS — Z9989 Dependence on other enabling machines and devices: Secondary | ICD-10-CM

## 2017-01-05 ENCOUNTER — Ambulatory Visit: Payer: BC Managed Care – PPO | Admitting: Physician Assistant
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
## 2017-01-06 ENCOUNTER — Encounter: Payer: Self-pay | Admitting: Physician Assistant

## 2017-01-06 ENCOUNTER — Ambulatory Visit (INDEPENDENT_AMBULATORY_CARE_PROVIDER_SITE_OTHER): Payer: BC Managed Care – PPO | Admitting: Physician Assistant

## 2017-01-06 VITALS — BP 128/82 | HR 66 | Ht 66.0 in | Wt 204.5 lb

## 2017-01-06 DIAGNOSIS — C61 Malignant neoplasm of prostate: Secondary | ICD-10-CM | POA: Diagnosis not present

## 2017-01-06 DIAGNOSIS — I25110 Atherosclerotic heart disease of native coronary artery with unstable angina pectoris: Secondary | ICD-10-CM

## 2017-01-06 DIAGNOSIS — E785 Hyperlipidemia, unspecified: Secondary | ICD-10-CM | POA: Diagnosis not present

## 2017-01-06 DIAGNOSIS — I10 Essential (primary) hypertension: Secondary | ICD-10-CM

## 2017-01-06 DIAGNOSIS — I200 Unstable angina: Secondary | ICD-10-CM

## 2017-01-06 MED ORDER — ISOSORBIDE MONONITRATE ER 30 MG PO TB24: 30.0000 mg | ORAL_TABLET | Freq: Every day | ORAL | 3 refills | Status: DC

## 2017-01-06 NOTE — Patient Instructions (Signed)
Medication Instructions:  Your physician recommends that you continue on your current medications as directed. Please refer to the Current Medication list given to you today. 1.  Start Imdur 30 mg daily   Labwork: None ordered  Testing/Procedures: None ordered  Follow-Up: Your physician recommends that you schedule a follow-up appointment in: 2-3 MONTHS WITH DR. ROSS  Any Other Special Instructions Will Be Listed Below (If Applicable).     If you need a refill on your cardiac medications before your next appointment, please call your pharmacy.

## 2017-01-10 ENCOUNTER — Encounter (HOSPITAL_COMMUNITY): Payer: Self-pay | Admitting: Interventional Cardiology

## 2017-01-13 ENCOUNTER — Other Ambulatory Visit: Payer: BC Managed Care – PPO | Admitting: *Deleted

## 2017-01-13 DIAGNOSIS — E785 Hyperlipidemia, unspecified: Secondary | ICD-10-CM

## 2017-01-13 LAB — LIPID PANEL
Chol/HDL Ratio: 2.4 ratio (ref 0.0–5.0)
Cholesterol, Total: 112 mg/dL (ref 100–199)
HDL: 46 mg/dL (ref >39)
LDL Calculated: 55 mg/dL (ref 0–99)
Triglycerides: 55 mg/dL (ref 0–149)
VLDL Cholesterol Cal: 11 mg/dL (ref 5–40)

## 2017-01-13 LAB — HEPATIC FUNCTION PANEL
ALT: 64 IU/L — ABNORMAL HIGH (ref 0–44)
AST: 35 IU/L (ref 0–40)
Albumin: 4.2 g/dL (ref 3.6–4.8)
Alkaline Phosphatase: 98 IU/L (ref 39–117)
Bilirubin Total: 0.4 mg/dL (ref 0.0–1.2)
Bilirubin, Direct: 0.14 mg/dL (ref 0.00–0.40)
Total Protein: 7.1 g/dL (ref 6.0–8.5)

## 2017-01-15 ENCOUNTER — Encounter (HOSPITAL_BASED_OUTPATIENT_CLINIC_OR_DEPARTMENT_OTHER): Payer: BC Managed Care – PPO

## 2017-01-16 ENCOUNTER — Other Ambulatory Visit: Payer: BC Managed Care – PPO

## 2017-01-16 DIAGNOSIS — Z8052 Family history of malignant neoplasm of bladder: Secondary | ICD-10-CM | POA: Diagnosis not present

## 2017-01-16 DIAGNOSIS — C61 Malignant neoplasm of prostate: Secondary | ICD-10-CM | POA: Diagnosis not present

## 2017-01-16 DIAGNOSIS — Z8042 Family history of malignant neoplasm of prostate: Secondary | ICD-10-CM | POA: Diagnosis not present

## 2017-01-17 DIAGNOSIS — C61 Malignant neoplasm of prostate: Secondary | ICD-10-CM | POA: Diagnosis not present

## 2017-01-17 DIAGNOSIS — Z8042 Family history of malignant neoplasm of prostate: Secondary | ICD-10-CM | POA: Diagnosis not present

## 2017-01-17 DIAGNOSIS — Z8052 Family history of malignant neoplasm of bladder: Secondary | ICD-10-CM | POA: Diagnosis not present

## 2017-01-18 DIAGNOSIS — C61 Malignant neoplasm of prostate: Secondary | ICD-10-CM | POA: Diagnosis not present

## 2017-01-18 DIAGNOSIS — Z8052 Family history of malignant neoplasm of bladder: Secondary | ICD-10-CM | POA: Diagnosis not present

## 2017-01-18 DIAGNOSIS — Z8042 Family history of malignant neoplasm of prostate: Secondary | ICD-10-CM | POA: Diagnosis not present

## 2017-01-19 DIAGNOSIS — Z8042 Family history of malignant neoplasm of prostate: Secondary | ICD-10-CM | POA: Diagnosis not present

## 2017-01-19 DIAGNOSIS — C61 Malignant neoplasm of prostate: Secondary | ICD-10-CM | POA: Diagnosis not present

## 2017-01-19 DIAGNOSIS — Z8052 Family history of malignant neoplasm of bladder: Secondary | ICD-10-CM | POA: Diagnosis not present

## 2017-01-20 DIAGNOSIS — C61 Malignant neoplasm of prostate: Secondary | ICD-10-CM | POA: Diagnosis not present

## 2017-01-20 DIAGNOSIS — Z8042 Family history of malignant neoplasm of prostate: Secondary | ICD-10-CM | POA: Diagnosis not present

## 2017-01-20 DIAGNOSIS — Z8052 Family history of malignant neoplasm of bladder: Secondary | ICD-10-CM | POA: Diagnosis not present

## 2017-01-23 DIAGNOSIS — Z8042 Family history of malignant neoplasm of prostate: Secondary | ICD-10-CM | POA: Diagnosis not present

## 2017-01-23 DIAGNOSIS — Z8052 Family history of malignant neoplasm of bladder: Secondary | ICD-10-CM | POA: Diagnosis not present

## 2017-01-23 DIAGNOSIS — C61 Malignant neoplasm of prostate: Secondary | ICD-10-CM | POA: Diagnosis not present

## 2017-01-24 ENCOUNTER — Other Ambulatory Visit: Payer: Self-pay | Admitting: Internal Medicine

## 2017-01-24 DIAGNOSIS — C61 Malignant neoplasm of prostate: Secondary | ICD-10-CM | POA: Diagnosis not present

## 2017-01-24 DIAGNOSIS — Z8042 Family history of malignant neoplasm of prostate: Secondary | ICD-10-CM | POA: Diagnosis not present

## 2017-01-24 DIAGNOSIS — Z8052 Family history of malignant neoplasm of bladder: Secondary | ICD-10-CM | POA: Diagnosis not present

## 2017-01-25 DIAGNOSIS — Z8052 Family history of malignant neoplasm of bladder: Secondary | ICD-10-CM | POA: Diagnosis not present

## 2017-01-25 DIAGNOSIS — Z8042 Family history of malignant neoplasm of prostate: Secondary | ICD-10-CM | POA: Diagnosis not present

## 2017-01-25 DIAGNOSIS — C61 Malignant neoplasm of prostate: Secondary | ICD-10-CM | POA: Diagnosis not present

## 2017-01-26 DIAGNOSIS — Z8042 Family history of malignant neoplasm of prostate: Secondary | ICD-10-CM | POA: Diagnosis not present

## 2017-01-26 DIAGNOSIS — C61 Malignant neoplasm of prostate: Secondary | ICD-10-CM | POA: Diagnosis not present

## 2017-01-26 DIAGNOSIS — Z8052 Family history of malignant neoplasm of bladder: Secondary | ICD-10-CM | POA: Diagnosis not present

## 2017-01-27 DIAGNOSIS — C61 Malignant neoplasm of prostate: Secondary | ICD-10-CM | POA: Diagnosis not present

## 2017-01-27 DIAGNOSIS — Z8052 Family history of malignant neoplasm of bladder: Secondary | ICD-10-CM | POA: Diagnosis not present

## 2017-01-27 DIAGNOSIS — Z8042 Family history of malignant neoplasm of prostate: Secondary | ICD-10-CM | POA: Diagnosis not present

## 2017-01-30 DIAGNOSIS — Z8042 Family history of malignant neoplasm of prostate: Secondary | ICD-10-CM | POA: Diagnosis not present

## 2017-01-30 DIAGNOSIS — Z8052 Family history of malignant neoplasm of bladder: Secondary | ICD-10-CM | POA: Diagnosis not present

## 2017-01-30 DIAGNOSIS — C61 Malignant neoplasm of prostate: Secondary | ICD-10-CM | POA: Diagnosis not present

## 2017-01-31 DIAGNOSIS — Z8042 Family history of malignant neoplasm of prostate: Secondary | ICD-10-CM | POA: Diagnosis not present

## 2017-01-31 DIAGNOSIS — C61 Malignant neoplasm of prostate: Secondary | ICD-10-CM | POA: Diagnosis not present

## 2017-01-31 DIAGNOSIS — Z8052 Family history of malignant neoplasm of bladder: Secondary | ICD-10-CM | POA: Diagnosis not present

## 2017-02-01 DIAGNOSIS — Z8042 Family history of malignant neoplasm of prostate: Secondary | ICD-10-CM | POA: Diagnosis not present

## 2017-02-01 DIAGNOSIS — C61 Malignant neoplasm of prostate: Secondary | ICD-10-CM | POA: Diagnosis not present

## 2017-02-01 DIAGNOSIS — Z8052 Family history of malignant neoplasm of bladder: Secondary | ICD-10-CM | POA: Diagnosis not present

## 2017-02-02 DIAGNOSIS — C61 Malignant neoplasm of prostate: Secondary | ICD-10-CM | POA: Diagnosis not present

## 2017-02-02 DIAGNOSIS — Z8052 Family history of malignant neoplasm of bladder: Secondary | ICD-10-CM | POA: Diagnosis not present

## 2017-02-02 DIAGNOSIS — Z8042 Family history of malignant neoplasm of prostate: Secondary | ICD-10-CM | POA: Diagnosis not present

## 2017-02-03 DIAGNOSIS — C61 Malignant neoplasm of prostate: Secondary | ICD-10-CM | POA: Diagnosis not present

## 2017-02-03 DIAGNOSIS — Z8052 Family history of malignant neoplasm of bladder: Secondary | ICD-10-CM | POA: Diagnosis not present

## 2017-02-03 DIAGNOSIS — Z8042 Family history of malignant neoplasm of prostate: Secondary | ICD-10-CM | POA: Diagnosis not present

## 2017-02-06 DIAGNOSIS — C61 Malignant neoplasm of prostate: Secondary | ICD-10-CM | POA: Diagnosis not present

## 2017-02-06 DIAGNOSIS — Z8052 Family history of malignant neoplasm of bladder: Secondary | ICD-10-CM | POA: Diagnosis not present

## 2017-02-06 DIAGNOSIS — Z8042 Family history of malignant neoplasm of prostate: Secondary | ICD-10-CM | POA: Diagnosis not present

## 2017-02-07 DIAGNOSIS — Z8052 Family history of malignant neoplasm of bladder: Secondary | ICD-10-CM | POA: Diagnosis not present

## 2017-02-07 DIAGNOSIS — C61 Malignant neoplasm of prostate: Secondary | ICD-10-CM | POA: Diagnosis not present

## 2017-02-07 DIAGNOSIS — Z8042 Family history of malignant neoplasm of prostate: Secondary | ICD-10-CM | POA: Diagnosis not present

## 2017-02-08 DIAGNOSIS — C61 Malignant neoplasm of prostate: Secondary | ICD-10-CM | POA: Diagnosis not present

## 2017-02-08 DIAGNOSIS — Z8052 Family history of malignant neoplasm of bladder: Secondary | ICD-10-CM | POA: Diagnosis not present

## 2017-02-08 DIAGNOSIS — Z8042 Family history of malignant neoplasm of prostate: Secondary | ICD-10-CM | POA: Diagnosis not present

## 2017-02-09 DIAGNOSIS — Z8042 Family history of malignant neoplasm of prostate: Secondary | ICD-10-CM | POA: Diagnosis not present

## 2017-02-09 DIAGNOSIS — C61 Malignant neoplasm of prostate: Secondary | ICD-10-CM | POA: Diagnosis not present

## 2017-02-09 DIAGNOSIS — Z8052 Family history of malignant neoplasm of bladder: Secondary | ICD-10-CM | POA: Diagnosis not present

## 2017-02-10 DIAGNOSIS — Z8052 Family history of malignant neoplasm of bladder: Secondary | ICD-10-CM | POA: Diagnosis not present

## 2017-02-10 DIAGNOSIS — Z8042 Family history of malignant neoplasm of prostate: Secondary | ICD-10-CM | POA: Diagnosis not present

## 2017-02-10 DIAGNOSIS — C61 Malignant neoplasm of prostate: Secondary | ICD-10-CM | POA: Diagnosis not present

## 2017-02-13 DIAGNOSIS — C61 Malignant neoplasm of prostate: Secondary | ICD-10-CM | POA: Diagnosis not present

## 2017-02-13 DIAGNOSIS — Z8052 Family history of malignant neoplasm of bladder: Secondary | ICD-10-CM | POA: Diagnosis not present

## 2017-02-13 DIAGNOSIS — Z8042 Family history of malignant neoplasm of prostate: Secondary | ICD-10-CM | POA: Diagnosis not present

## 2017-02-14 DIAGNOSIS — Z8052 Family history of malignant neoplasm of bladder: Secondary | ICD-10-CM | POA: Diagnosis not present

## 2017-02-14 DIAGNOSIS — Z8042 Family history of malignant neoplasm of prostate: Secondary | ICD-10-CM | POA: Diagnosis not present

## 2017-02-14 DIAGNOSIS — C61 Malignant neoplasm of prostate: Secondary | ICD-10-CM | POA: Diagnosis not present

## 2017-02-15 DIAGNOSIS — C61 Malignant neoplasm of prostate: Secondary | ICD-10-CM | POA: Diagnosis not present

## 2017-02-15 DIAGNOSIS — Z8042 Family history of malignant neoplasm of prostate: Secondary | ICD-10-CM | POA: Diagnosis not present

## 2017-02-15 DIAGNOSIS — Z8052 Family history of malignant neoplasm of bladder: Secondary | ICD-10-CM | POA: Diagnosis not present

## 2017-02-16 DIAGNOSIS — C61 Malignant neoplasm of prostate: Secondary | ICD-10-CM | POA: Diagnosis not present

## 2017-02-16 DIAGNOSIS — Z8052 Family history of malignant neoplasm of bladder: Secondary | ICD-10-CM | POA: Diagnosis not present

## 2017-02-16 DIAGNOSIS — Z8042 Family history of malignant neoplasm of prostate: Secondary | ICD-10-CM | POA: Diagnosis not present

## 2017-02-17 DIAGNOSIS — Z8042 Family history of malignant neoplasm of prostate: Secondary | ICD-10-CM | POA: Diagnosis not present

## 2017-02-17 DIAGNOSIS — Z8052 Family history of malignant neoplasm of bladder: Secondary | ICD-10-CM | POA: Diagnosis not present

## 2017-02-17 DIAGNOSIS — C61 Malignant neoplasm of prostate: Secondary | ICD-10-CM | POA: Diagnosis not present

## 2017-02-18 ENCOUNTER — Other Ambulatory Visit: Payer: Self-pay | Admitting: Internal Medicine

## 2017-02-20 DIAGNOSIS — C61 Malignant neoplasm of prostate: Secondary | ICD-10-CM | POA: Diagnosis not present

## 2017-02-20 DIAGNOSIS — Z8042 Family history of malignant neoplasm of prostate: Secondary | ICD-10-CM | POA: Diagnosis not present

## 2017-02-20 DIAGNOSIS — Z8052 Family history of malignant neoplasm of bladder: Secondary | ICD-10-CM | POA: Diagnosis not present

## 2017-02-21 DIAGNOSIS — Z8042 Family history of malignant neoplasm of prostate: Secondary | ICD-10-CM | POA: Diagnosis not present

## 2017-02-21 DIAGNOSIS — C61 Malignant neoplasm of prostate: Secondary | ICD-10-CM | POA: Diagnosis not present

## 2017-02-21 DIAGNOSIS — Z8052 Family history of malignant neoplasm of bladder: Secondary | ICD-10-CM | POA: Diagnosis not present

## 2017-02-22 DIAGNOSIS — Z8042 Family history of malignant neoplasm of prostate: Secondary | ICD-10-CM | POA: Diagnosis not present

## 2017-02-22 DIAGNOSIS — C61 Malignant neoplasm of prostate: Secondary | ICD-10-CM | POA: Diagnosis not present

## 2017-02-22 DIAGNOSIS — Z8052 Family history of malignant neoplasm of bladder: Secondary | ICD-10-CM | POA: Diagnosis not present

## 2017-02-23 DIAGNOSIS — Z8042 Family history of malignant neoplasm of prostate: Secondary | ICD-10-CM | POA: Diagnosis not present

## 2017-02-23 DIAGNOSIS — C61 Malignant neoplasm of prostate: Secondary | ICD-10-CM | POA: Diagnosis not present

## 2017-02-23 DIAGNOSIS — Z8052 Family history of malignant neoplasm of bladder: Secondary | ICD-10-CM | POA: Diagnosis not present

## 2017-02-24 DIAGNOSIS — Z8042 Family history of malignant neoplasm of prostate: Secondary | ICD-10-CM | POA: Diagnosis not present

## 2017-02-24 DIAGNOSIS — C61 Malignant neoplasm of prostate: Secondary | ICD-10-CM | POA: Diagnosis not present

## 2017-02-24 DIAGNOSIS — Z8052 Family history of malignant neoplasm of bladder: Secondary | ICD-10-CM | POA: Diagnosis not present

## 2017-02-27 DIAGNOSIS — Z8052 Family history of malignant neoplasm of bladder: Secondary | ICD-10-CM | POA: Diagnosis not present

## 2017-02-27 DIAGNOSIS — C61 Malignant neoplasm of prostate: Secondary | ICD-10-CM | POA: Diagnosis not present

## 2017-02-27 DIAGNOSIS — Z8042 Family history of malignant neoplasm of prostate: Secondary | ICD-10-CM | POA: Diagnosis not present

## 2017-02-28 DIAGNOSIS — C61 Malignant neoplasm of prostate: Secondary | ICD-10-CM | POA: Diagnosis not present

## 2017-02-28 DIAGNOSIS — Z8042 Family history of malignant neoplasm of prostate: Secondary | ICD-10-CM | POA: Diagnosis not present

## 2017-02-28 DIAGNOSIS — Z8052 Family history of malignant neoplasm of bladder: Secondary | ICD-10-CM | POA: Diagnosis not present

## 2017-03-01 DIAGNOSIS — Z8052 Family history of malignant neoplasm of bladder: Secondary | ICD-10-CM | POA: Diagnosis not present

## 2017-03-01 DIAGNOSIS — C61 Malignant neoplasm of prostate: Secondary | ICD-10-CM | POA: Diagnosis not present

## 2017-03-01 DIAGNOSIS — Z8042 Family history of malignant neoplasm of prostate: Secondary | ICD-10-CM | POA: Diagnosis not present

## 2017-03-02 DIAGNOSIS — C61 Malignant neoplasm of prostate: Secondary | ICD-10-CM | POA: Diagnosis not present

## 2017-03-02 DIAGNOSIS — Z8052 Family history of malignant neoplasm of bladder: Secondary | ICD-10-CM | POA: Diagnosis not present

## 2017-03-02 DIAGNOSIS — Z8042 Family history of malignant neoplasm of prostate: Secondary | ICD-10-CM | POA: Diagnosis not present

## 2017-03-06 DIAGNOSIS — Z8052 Family history of malignant neoplasm of bladder: Secondary | ICD-10-CM | POA: Diagnosis not present

## 2017-03-06 DIAGNOSIS — C61 Malignant neoplasm of prostate: Secondary | ICD-10-CM | POA: Diagnosis not present

## 2017-03-06 DIAGNOSIS — Z8042 Family history of malignant neoplasm of prostate: Secondary | ICD-10-CM | POA: Diagnosis not present

## 2017-03-07 DIAGNOSIS — Z8042 Family history of malignant neoplasm of prostate: Secondary | ICD-10-CM | POA: Diagnosis not present

## 2017-03-07 DIAGNOSIS — C61 Malignant neoplasm of prostate: Secondary | ICD-10-CM | POA: Diagnosis not present

## 2017-03-07 DIAGNOSIS — Z8052 Family history of malignant neoplasm of bladder: Secondary | ICD-10-CM | POA: Diagnosis not present

## 2017-03-08 DIAGNOSIS — Z8052 Family history of malignant neoplasm of bladder: Secondary | ICD-10-CM | POA: Diagnosis not present

## 2017-03-08 DIAGNOSIS — Z8042 Family history of malignant neoplasm of prostate: Secondary | ICD-10-CM | POA: Diagnosis not present

## 2017-03-08 DIAGNOSIS — C61 Malignant neoplasm of prostate: Secondary | ICD-10-CM | POA: Diagnosis not present

## 2017-03-09 DIAGNOSIS — C61 Malignant neoplasm of prostate: Secondary | ICD-10-CM | POA: Diagnosis not present

## 2017-03-09 DIAGNOSIS — Z8042 Family history of malignant neoplasm of prostate: Secondary | ICD-10-CM | POA: Diagnosis not present

## 2017-03-09 DIAGNOSIS — Z8052 Family history of malignant neoplasm of bladder: Secondary | ICD-10-CM | POA: Diagnosis not present

## 2017-03-10 DIAGNOSIS — C61 Malignant neoplasm of prostate: Secondary | ICD-10-CM | POA: Diagnosis not present

## 2017-03-10 DIAGNOSIS — Z8052 Family history of malignant neoplasm of bladder: Secondary | ICD-10-CM | POA: Diagnosis not present

## 2017-03-10 DIAGNOSIS — Z8042 Family history of malignant neoplasm of prostate: Secondary | ICD-10-CM | POA: Diagnosis not present

## 2017-03-15 DIAGNOSIS — Z8042 Family history of malignant neoplasm of prostate: Secondary | ICD-10-CM | POA: Diagnosis not present

## 2017-03-15 DIAGNOSIS — Z8052 Family history of malignant neoplasm of bladder: Secondary | ICD-10-CM | POA: Diagnosis not present

## 2017-03-15 DIAGNOSIS — C61 Malignant neoplasm of prostate: Secondary | ICD-10-CM | POA: Diagnosis not present

## 2017-03-16 ENCOUNTER — Other Ambulatory Visit: Payer: Self-pay | Admitting: Internal Medicine

## 2017-03-16 DIAGNOSIS — C61 Malignant neoplasm of prostate: Secondary | ICD-10-CM | POA: Diagnosis not present

## 2017-03-16 DIAGNOSIS — Z8052 Family history of malignant neoplasm of bladder: Secondary | ICD-10-CM | POA: Diagnosis not present

## 2017-03-16 DIAGNOSIS — Z8042 Family history of malignant neoplasm of prostate: Secondary | ICD-10-CM | POA: Diagnosis not present

## 2017-03-17 DIAGNOSIS — Z8052 Family history of malignant neoplasm of bladder: Secondary | ICD-10-CM | POA: Diagnosis not present

## 2017-03-17 DIAGNOSIS — Z8042 Family history of malignant neoplasm of prostate: Secondary | ICD-10-CM | POA: Diagnosis not present

## 2017-03-17 DIAGNOSIS — C61 Malignant neoplasm of prostate: Secondary | ICD-10-CM | POA: Diagnosis not present

## 2017-03-20 DIAGNOSIS — Z8042 Family history of malignant neoplasm of prostate: Secondary | ICD-10-CM | POA: Diagnosis not present

## 2017-03-20 DIAGNOSIS — Z8052 Family history of malignant neoplasm of bladder: Secondary | ICD-10-CM | POA: Diagnosis not present

## 2017-03-20 DIAGNOSIS — C61 Malignant neoplasm of prostate: Secondary | ICD-10-CM | POA: Diagnosis not present

## 2017-03-21 DIAGNOSIS — C61 Malignant neoplasm of prostate: Secondary | ICD-10-CM | POA: Diagnosis not present

## 2017-03-21 DIAGNOSIS — Z8042 Family history of malignant neoplasm of prostate: Secondary | ICD-10-CM | POA: Diagnosis not present

## 2017-03-21 DIAGNOSIS — Z8052 Family history of malignant neoplasm of bladder: Secondary | ICD-10-CM | POA: Diagnosis not present

## 2017-04-27 DIAGNOSIS — H2513 Age-related nuclear cataract, bilateral: Secondary | ICD-10-CM | POA: Diagnosis not present

## 2017-04-27 DIAGNOSIS — H10413 Chronic giant papillary conjunctivitis, bilateral: Secondary | ICD-10-CM | POA: Diagnosis not present

## 2017-04-27 DIAGNOSIS — H02401 Unspecified ptosis of right eyelid: Secondary | ICD-10-CM | POA: Diagnosis not present

## 2017-04-27 DIAGNOSIS — H53001 Unspecified amblyopia, right eye: Secondary | ICD-10-CM | POA: Diagnosis not present

## 2017-04-27 DIAGNOSIS — H40013 Open angle with borderline findings, low risk, bilateral: Secondary | ICD-10-CM | POA: Diagnosis not present

## 2017-05-22 ENCOUNTER — Ambulatory Visit (INDEPENDENT_AMBULATORY_CARE_PROVIDER_SITE_OTHER)
Admission: RE | Admit: 2017-05-22 | Discharge: 2017-05-22 | Disposition: A | Discharge status: Home / Self Care | Payer: BC Managed Care – PPO | Source: Ambulatory Visit | Attending: Pulmonary Disease | Admitting: Pulmonary Disease

## 2017-05-22 DIAGNOSIS — R911 Solitary pulmonary nodule: Secondary | ICD-10-CM

## 2017-05-22 DIAGNOSIS — C61 Malignant neoplasm of prostate: Secondary | ICD-10-CM | POA: Diagnosis not present

## 2017-05-31 ENCOUNTER — Other Ambulatory Visit: Payer: Self-pay | Admitting: Pulmonary Disease

## 2017-05-31 DIAGNOSIS — G4733 Obstructive sleep apnea (adult) (pediatric): Secondary | ICD-10-CM

## 2017-05-31 DIAGNOSIS — R918 Other nonspecific abnormal finding of lung field: Secondary | ICD-10-CM

## 2017-05-31 NOTE — Addendum Note (Signed)
Addended by: Della Goo C on: 05/31/2017 09:50 AM   Modules accepted: Orders

## 2017-05-31 NOTE — Progress Notes (Signed)
Per Dr. Vaughan Browner. CT without contrast ordered for 12 months. Sleep study ordered for patient.

## 2017-07-03 ENCOUNTER — Ambulatory Visit (HOSPITAL_BASED_OUTPATIENT_CLINIC_OR_DEPARTMENT_OTHER): Payer: BC Managed Care – PPO | Attending: Pulmonary Disease | Admitting: Pulmonary Disease

## 2017-07-03 VITALS — Ht 66.0 in | Wt 205.0 lb

## 2017-07-03 DIAGNOSIS — I10 Essential (primary) hypertension: Secondary | ICD-10-CM | POA: Insufficient documentation

## 2017-07-03 DIAGNOSIS — G4733 Obstructive sleep apnea (adult) (pediatric): Secondary | ICD-10-CM | POA: Diagnosis not present

## 2017-07-03 DIAGNOSIS — R0683 Snoring: Secondary | ICD-10-CM | POA: Insufficient documentation

## 2017-07-03 DIAGNOSIS — E6609 Other obesity due to excess calories: Secondary | ICD-10-CM | POA: Insufficient documentation

## 2017-07-03 DIAGNOSIS — Z6833 Body mass index (BMI) 33.0-33.9, adult: Secondary | ICD-10-CM | POA: Insufficient documentation

## 2017-07-04 ENCOUNTER — Encounter (HOSPITAL_BASED_OUTPATIENT_CLINIC_OR_DEPARTMENT_OTHER): Payer: BC Managed Care – PPO

## 2017-07-08 DIAGNOSIS — G4733 Obstructive sleep apnea (adult) (pediatric): Secondary | ICD-10-CM

## 2017-07-08 NOTE — Procedures (Signed)
   Patient Name: Manuel Barker, Manuel Barker Date: 07/03/2017 Gender: Male D.O.B: [birth year] Age (years): 66 Referring Provider: Marshell Garfinkel Height (inches): 45 Interpreting Physician: Chesley Mires MD, ABSM Weight (lbs): 205 RPSGT: Carolin Coy BMI: 33 MRN: [id number] Neck Size: 18.00 <br> <br> CLINICAL INFORMATION Sleep Study Type: Split Night CPAP    Indication for sleep study: Hypertension, Obesity, OSA, Snoring    Epworth Sleepiness Score: 8  SLEEP STUDY TECHNIQUE As per the AASM Manual for the Scoring of Sleep and Associated Events v2.3 (April 2016) with a hypopnea requiring 4% desaturations.  The channels recorded and monitored were frontal, central and occipital EEG, electrooculogram (EOG), submentalis EMG (chin), nasal and oral airflow, thoracic and abdominal wall motion, anterior tibialis EMG, snore microphone, electrocardiogram, and pulse oximetry. Continuous positive airway pressure (CPAP) was initiated when the patient met split night criteria and was titrated according to treat sleep-disordered breathing.  MEDICATIONS Medications self-administered by patient taken the night of the study : N/A  RESPIRATORY PARAMETERS Diagnostic  Total AHI (/hr): 53.3 RDI (/hr): 64.7 OA Index (/hr): 22.8 CA Index (/hr): 1.2 REM AHI (/hr): 81.6 NREM AHI (/hr): 50.7 Supine AHI (/hr): 71.8 Non-supine AHI (/hr): 0.00 Min O2 Sat (%): 77.0 Mean O2 (%): 93.0 Time below 88% (min): 8   Titration  Optimal Pressure (cm): 15 AHI at Optimal Pressure (/hr): 0.4 Min O2 at Optimal Pressure (%): 94.0 Supine % at Optimal (%): 89 Sleep % at Optimal (%): 97   SLEEP ARCHITECTURE The recording time for the entire night was 432.1 minutes.  During a baseline period of 182.5 minutes, the patient slept for 147.5 minutes in REM and nonREM, yielding a sleep efficiency of 80.8%%. Sleep onset after lights out was 12.4 minutes with a REM latency of 67.5 minutes. The patient spent 27.5%% of the night in  stage N1 sleep, 64.1%% in stage N2 sleep, 0.0%% in stage N3 and 8.5%% in REM.    During the titration period of 248.1 minutes, the patient slept for 221.7 minutes in REM and nonREM, yielding a sleep efficiency of 89.4%%. Sleep onset after CPAP initiation was 4.9 minutes with a REM latency of 27.0 minutes. The patient spent 13.4%% of the night in stage N1 sleep, 55.9%% in stage N2 sleep, 0.0%% in stage N3 and 30.7%% in REM.  CARDIAC DATA The 2 lead EKG demonstrated sinus rhythm. The mean heart rate was 100.0 beats per minute. Other EKG findings include: PVCs. LEG MOVEMENT DATA The total Periodic Limb Movements of Sleep (PLMS) were 0. The PLMS index was 0.0 .  IMPRESSIONS - Severe obstructive sleep apnea occurred during the diagnostic portion of the study (AHI = 53.3/hour, SpO2 low 77%).  - He did well with CPAP at 15 cm H2O.  He did not require supplemental oxygen during this study. DIAGNOSIS - Obstructive Sleep Apnea (327.23 [G47.33 ICD-10]) RECOMMENDATIONS - Trial of CPAP therapy on 15 cm H2O with a Medium size Resmed Full Face Mask AirFit F20 mask and heated humidification.  [Electronically signed] 07/08/2017 01:22 PM Chesley Mires, MD, [place], American Board of Sleep Medicine  NPI: [id number]

## 2017-07-24 ENCOUNTER — Other Ambulatory Visit: Payer: Self-pay | Admitting: Internal Medicine

## 2017-08-01 ENCOUNTER — Encounter: Payer: Self-pay | Admitting: Pulmonary Disease

## 2017-08-16 ENCOUNTER — Ambulatory Visit (INDEPENDENT_AMBULATORY_CARE_PROVIDER_SITE_OTHER): Payer: BC Managed Care – PPO | Admitting: Family Medicine

## 2017-08-16 DIAGNOSIS — Z23 Encounter for immunization: Secondary | ICD-10-CM

## 2017-08-23 ENCOUNTER — Other Ambulatory Visit: Payer: Self-pay | Admitting: Internal Medicine

## 2017-08-25 DIAGNOSIS — C61 Malignant neoplasm of prostate: Secondary | ICD-10-CM | POA: Diagnosis not present

## 2017-09-22 ENCOUNTER — Other Ambulatory Visit: Payer: Self-pay | Admitting: Internal Medicine

## 2017-09-22 DIAGNOSIS — C61 Malignant neoplasm of prostate: Secondary | ICD-10-CM | POA: Diagnosis not present

## 2017-09-22 DIAGNOSIS — K625 Hemorrhage of anus and rectum: Secondary | ICD-10-CM | POA: Diagnosis not present

## 2017-09-25 ENCOUNTER — Other Ambulatory Visit: Payer: Self-pay | Admitting: Internal Medicine

## 2017-09-25 ENCOUNTER — Other Ambulatory Visit: Payer: Self-pay | Admitting: Cardiology

## 2017-09-25 MED ORDER — CLOPIDOGREL BISULFATE 75 MG PO TABS: 75.0000 mg | ORAL_TABLET | Freq: Every day | ORAL | 0 refills | Status: DC

## 2017-09-25 NOTE — Telephone Encounter (Signed)
Pt called stated that he left his medication our of town and needed a refill on his medication. I sent in a 90 day supply for pt to get his medication. Confirmation received.

## 2017-10-02 ENCOUNTER — Ambulatory Visit (INDEPENDENT_AMBULATORY_CARE_PROVIDER_SITE_OTHER): Payer: BC Managed Care – PPO | Admitting: Internal Medicine

## 2017-10-02 ENCOUNTER — Encounter: Payer: Self-pay | Admitting: Internal Medicine

## 2017-10-02 VITALS — BP 118/78 | HR 70 | Ht 66.0 in | Wt 213.4 lb

## 2017-10-02 DIAGNOSIS — I2510 Atherosclerotic heart disease of native coronary artery without angina pectoris: Secondary | ICD-10-CM | POA: Diagnosis not present

## 2017-10-02 DIAGNOSIS — I10 Essential (primary) hypertension: Secondary | ICD-10-CM | POA: Diagnosis not present

## 2017-10-02 DIAGNOSIS — R110 Nausea: Secondary | ICD-10-CM | POA: Diagnosis not present

## 2017-10-02 DIAGNOSIS — E785 Hyperlipidemia, unspecified: Secondary | ICD-10-CM

## 2017-10-02 LAB — CBC
Hematocrit: 43.5 % (ref 37.5–51.0)
Hemoglobin: 14.6 g/dL (ref 13.0–17.7)
MCH: 28.4 pg (ref 26.6–33.0)
MCHC: 33.6 g/dL (ref 31.5–35.7)
MCV: 85 fL (ref 79–97)
Platelets: 199 10*3/uL (ref 150–450)
RBC: 5.14 x10E6/uL (ref 4.14–5.80)
RDW: 15.1 % (ref 12.3–15.4)
WBC: 4.5 10*3/uL (ref 3.4–10.8)

## 2017-10-02 LAB — LIPID PANEL
Chol/HDL Ratio: 2.7 ratio (ref 0.0–5.0)
Cholesterol, Total: 105 mg/dL (ref 100–199)
HDL: 39 mg/dL — ABNORMAL LOW (ref >39)
LDL Calculated: 50 mg/dL (ref 0–99)
Triglycerides: 79 mg/dL (ref 0–149)
VLDL Cholesterol Cal: 16 mg/dL (ref 5–40)

## 2017-10-02 LAB — BASIC METABOLIC PANEL
BUN/Creatinine Ratio: 11 (ref 10–24)
BUN: 11 mg/dL (ref 8–27)
CO2: 21 mmol/L (ref 20–29)
Calcium: 9.2 mg/dL (ref 8.6–10.2)
Chloride: 105 mmol/L (ref 96–106)
Creatinine, Ser: 1 mg/dL (ref 0.76–1.27)
GFR calc Af Amer: 90 mL/min/{1.73_m2} (ref >59)
GFR calc non Af Amer: 78 mL/min/{1.73_m2} (ref >59)
Glucose: 138 mg/dL — ABNORMAL HIGH (ref 65–99)
Potassium: 4.2 mmol/L (ref 3.5–5.2)
Sodium: 139 mmol/L (ref 134–144)

## 2017-10-02 NOTE — Patient Instructions (Signed)
Your physician recommends that you continue on your current medications as directed. Please refer to the Current Medication list given to you today.  Your physician recommends that you return for lab work today (CBC, BMET, Manuel Barker)  Your physician wants you to follow-up in: May 2020 with Dr. Harrington Challenger.  You will receive a reminder letter in the mail two months in advance. If you don't receive a letter, please call our office to schedule the follow-up appointment.
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
## 2017-10-03 ENCOUNTER — Telehealth: Payer: Self-pay

## 2017-10-03 DIAGNOSIS — G4733 Obstructive sleep apnea (adult) (pediatric): Secondary | ICD-10-CM

## 2017-10-03 NOTE — Telephone Encounter (Signed)
Pt returning call. Pt contact number [phone]

## 2017-10-03 NOTE — Telephone Encounter (Signed)
Manuel Garfinkel, MD    10/03/17 2:09 PM  Note    Manuel Barker  Sorry for not getting back sooner.  The sleep study confirms sleep apnea. We will start you on CPAP CPAP therapy on 15 cm H2O with a Medium size Resmed Full Face Mask AirFit F20 mask and heated humidification.  Manuel Garfinkel MD [facility] 10/03/2017, 2:10 PM     lmtcb x1 for pt.  Will route to myself for f/u.

## 2017-10-03 NOTE — Telephone Encounter (Signed)
Manuel Barker  Sorry for not getting back sooner.  The sleep study confirms sleep apnea. We will start you on CPAP CPAP therapy on 15 cm H2O with a Medium size Resmed Full Face Mask AirFit F20 mask and heated humidification.  Marshell Garfinkel MD [facility] 10/03/2017, 2:10 PM

## 2017-10-03 NOTE — Telephone Encounter (Signed)
Advised pt of results. Pt understood and nothing further is needed.   Pt stated he already had a CPAP machine but reminded him that he could get a new CPAP machine since he had his for over 5 years. Pt wanted to research the models first and then get back to [place]. Order sent to have CPAP settings changed to 15cm H20

## 2017-10-03 NOTE — Telephone Encounter (Signed)
Dr. Vaughan Browner please advise on sleep study results. Thanks.

## 2017-10-22 ENCOUNTER — Other Ambulatory Visit: Payer: Self-pay | Admitting: Internal Medicine

## 2017-10-23 NOTE — Telephone Encounter (Signed)
Ok,,,,,,,,,,, needs to get established with a new PCP for further refills

## 2017-10-23 NOTE — Telephone Encounter (Signed)
Pt aware to follow up with new PCP for further refills due to Dr.Kwiakowski's retiring.   [place] for refill?

## 2017-11-21 ENCOUNTER — Other Ambulatory Visit: Payer: Self-pay | Admitting: Cardiology

## 2018-01-02 ENCOUNTER — Other Ambulatory Visit: Payer: Self-pay | Admitting: *Deleted

## 2018-01-02 MED ORDER — TELMISARTAN 40 MG PO TABS: 40.0000 mg | ORAL_TABLET | Freq: Every day | ORAL | 0 refills | Status: DC

## 2018-01-29 ENCOUNTER — Other Ambulatory Visit: Payer: Self-pay | Admitting: Internal Medicine

## 2018-02-05 ENCOUNTER — Other Ambulatory Visit: Payer: Self-pay | Admitting: Internal Medicine

## 2018-02-05 NOTE — Telephone Encounter (Signed)
Patient called and advised he will need an appointment with a new provider, he agrees. I advised a physical is due. He says he will be out of town on Wednesday and asks for early morning appointment. Appointment scheduled for Tuesday, 02/13/18 at [facility] with Dr. Jerilee Hoh. He asks for a refill of his medication, he says he has enough to last until Wednesday.

## 2018-02-05 NOTE — Telephone Encounter (Signed)
Copied from [place] [id number]. Topic: Quick Communication - Rx Refill/Question >> Feb 05, 2018  1:56 PM Bea Graff, NT wrote: Medication: amLODipine (NORVASC) 5 MG tablet   Has the patient contacted their pharmacy? Yes.   (Agent: If no, request that the patient contact the pharmacy for the refill.) (Agent: If yes, when and what did the pharmacy advise?)    Preferred Pharmacy (with phone number or street name): [facility] [phone] - [place], [place] - [address] [phone] (Phone) [phone] (Fax)  Agent: Please be advised that RX refills may take up to 3 business days. We ask that you follow-up with your pharmacy.

## 2018-02-05 NOTE — Telephone Encounter (Signed)
Requested medication (s) are due for refill today: Yes  Requested medication (s) are on the active medication list: Yes  Last refill:  10/23/17  Future visit scheduled: Yes  Notes to clinic:  Unable to refill, last refilled by Dr. Raliegh Ip.     Requested Prescriptions  Pending Prescriptions Disp Refills   amLODipine (NORVASC) 5 MG tablet 90 tablet 0    Sig: Take 1 tablet (5 mg total) by mouth daily.     Cardiovascular:  Calcium Channel Blockers Failed - 02/05/2018  4:39 PM      Failed - Valid encounter within last 6 months    Recent Outpatient Visits          1 year ago Essential hypertension   [place] at [facility], Doretha Sou, MD   1 year ago Need for influenza vaccination   [place] [facility], Doretha Sou, MD   1 year ago Essential hypertension   [place] [facility], Doretha Sou, MD   2 years ago Essential hypertension   [facility], Doretha Sou, MD   2 years ago Dyslipidemia   [place] at [facility], Doretha Sou, MD      Future Appointments            In 1 week [name], [name], MD [place] [facility] [place], [place] BP in normal range    BP Readings from Last 1 Encounters:  10/02/17 118/78

## 2018-02-06 MED ORDER — AMLODIPINE BESYLATE 5 MG PO TABS: 5.0000 mg | ORAL_TABLET | Freq: Every day | ORAL | 0 refills | Status: DC

## 2018-02-07 ENCOUNTER — Other Ambulatory Visit: Payer: Self-pay | Admitting: Internal Medicine

## 2018-02-13 ENCOUNTER — Encounter: Payer: Self-pay | Admitting: Internal Medicine

## 2018-02-13 ENCOUNTER — Ambulatory Visit (INDEPENDENT_AMBULATORY_CARE_PROVIDER_SITE_OTHER): Payer: BC Managed Care – PPO | Admitting: Internal Medicine

## 2018-02-13 VITALS — BP 122/78 | HR 62 | Temp 97.6°F | Ht 65.0 in | Wt 206.0 lb

## 2018-02-13 DIAGNOSIS — C61 Malignant neoplasm of prostate: Secondary | ICD-10-CM | POA: Diagnosis not present

## 2018-02-13 DIAGNOSIS — K921 Melena: Secondary | ICD-10-CM

## 2018-02-13 DIAGNOSIS — Z23 Encounter for immunization: Secondary | ICD-10-CM | POA: Diagnosis not present

## 2018-02-13 DIAGNOSIS — Z9989 Dependence on other enabling machines and devices: Secondary | ICD-10-CM | POA: Diagnosis not present

## 2018-02-13 DIAGNOSIS — G4733 Obstructive sleep apnea (adult) (pediatric): Secondary | ICD-10-CM | POA: Diagnosis not present

## 2018-02-13 DIAGNOSIS — I422 Other hypertrophic cardiomyopathy: Secondary | ICD-10-CM | POA: Diagnosis not present

## 2018-02-13 DIAGNOSIS — I10 Essential (primary) hypertension: Secondary | ICD-10-CM

## 2018-02-13 DIAGNOSIS — E785 Hyperlipidemia, unspecified: Secondary | ICD-10-CM

## 2018-02-13 DIAGNOSIS — I25110 Atherosclerotic heart disease of native coronary artery with unstable angina pectoris: Secondary | ICD-10-CM

## 2018-02-13 DIAGNOSIS — I119 Hypertensive heart disease without heart failure: Secondary | ICD-10-CM

## 2018-02-13 LAB — CBC
HCT: 45.1 % (ref 39.0–52.0)
Hemoglobin: 15 g/dL (ref 13.0–17.0)
MCHC: 33.3 g/dL (ref 30.0–36.0)
MCV: 85.9 fl (ref 78.0–100.0)
Platelets: 202 10*3/uL (ref 150.0–400.0)
RBC: 5.25 Mil/uL (ref 4.22–5.81)
RDW: 15.5 % (ref 11.5–15.5)
WBC: 5.7 10*3/uL (ref 4.0–10.5)

## 2018-02-13 MED ORDER — TELMISARTAN 40 MG PO TABS: 40.0000 mg | ORAL_TABLET | Freq: Every day | ORAL | 3 refills | Status: DC
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
## 2018-02-13 NOTE — Patient Instructions (Addendum)
Great meeting you today.  Instructions: -Follow up with Dr. Ross/cardiology -Follow up with Oncology/Urology in March for PSA check in [place] will receive your flu vaccine today -Continue to follow up with Dr. Katy Fitch for eye exams -Refer back to GI due to blood in your stool -We will draw blood today, will notify you when results are available -Will send refill for Micardis. -Schedule follow up in 3 months for your physical.

## 2018-03-24 ENCOUNTER — Other Ambulatory Visit: Payer: Self-pay | Admitting: Internal Medicine

## 2018-05-04 ENCOUNTER — Telehealth: Payer: Self-pay | Admitting: Internal Medicine

## 2018-05-04 MED ORDER — AMLODIPINE BESYLATE 5 MG PO TABS: 5.0000 mg | ORAL_TABLET | Freq: Every day | ORAL | 1 refills | Status: DC

## 2018-05-04 NOTE — Telephone Encounter (Signed)
Refill sent.

## 2018-05-04 NOTE — Telephone Encounter (Signed)
Copied from [place] [phone]. Topic: Quick Communication - Rx Refill/Question >> May 04, 2018 12:45 PM Gustavus Messing wrote: Medication: amLODipine (NORVASC) 5 MG tablet [[id number]]   Has the patient contacted their pharmacy? No. (Agent: If no, request that the patient contact the pharmacy for the refill.) Patient is about to run out of this medication.    Preferred Pharmacy (with phone number or street name): [facility] [phone] - [place], [place] - [address] AT [address] [phone] (Phone) [phone] (Fax)    Agent: Please be advised that RX refills may take up to 3 business days. We ask that you follow-up with your pharmacy.

## 2018-05-17 ENCOUNTER — Telehealth: Payer: Self-pay | Admitting: *Deleted

## 2018-05-17 NOTE — Telephone Encounter (Signed)
LVMOM TO CALL ABOUT APPT

## 2018-05-18 ENCOUNTER — Telehealth: Payer: Self-pay | Admitting: Physician Assistant

## 2018-05-18 NOTE — Telephone Encounter (Signed)
°  5/15 : Spoke to patient.     VIDEO/[email or web address] visit on 05/18/2018.     Consent sent via MyChart    -  05/18/2018
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
## 2018-05-22 ENCOUNTER — Encounter: Payer: Self-pay | Admitting: Physician Assistant

## 2018-05-22 ENCOUNTER — Other Ambulatory Visit: Payer: Self-pay

## 2018-05-22 ENCOUNTER — Telehealth (INDEPENDENT_AMBULATORY_CARE_PROVIDER_SITE_OTHER): Payer: BC Managed Care – PPO | Admitting: Physician Assistant

## 2018-05-22 VITALS — BP 120/77 | HR 74 | Ht 66.0 in | Wt 201.0 lb

## 2018-05-22 DIAGNOSIS — I10 Essential (primary) hypertension: Secondary | ICD-10-CM

## 2018-05-22 DIAGNOSIS — Z7189 Other specified counseling: Secondary | ICD-10-CM

## 2018-05-22 DIAGNOSIS — I2510 Atherosclerotic heart disease of native coronary artery without angina pectoris: Secondary | ICD-10-CM

## 2018-05-22 DIAGNOSIS — E785 Hyperlipidemia, unspecified: Secondary | ICD-10-CM

## 2018-05-22 DIAGNOSIS — I25118 Atherosclerotic heart disease of native coronary artery with other forms of angina pectoris: Secondary | ICD-10-CM

## 2018-05-22 MED ORDER — CLOPIDOGREL BISULFATE 75 MG PO TABS: 75.0000 mg | ORAL_TABLET | Freq: Every day | ORAL | 3 refills | Status: DC

## 2018-05-22 NOTE — Patient Instructions (Signed)
Medication Instructions:  No changes.  Today we talked about whether or not to start a medication called Imdur (Isosorbide).  This is like nitroglycerin and it is used to prevent chest symptoms.  If you decide you want to start taking it, call me and let me know.  If you need a refill on your cardiac medications before your next appointment, please call your pharmacy.   Lab work: None   If you have labs (blood work) drawn today and your tests are completely normal, you will receive your results only by: Marland Kitchen MyChart Message (if you have MyChart) OR . A paper copy in the mail If you have any lab test that is abnormal or we need to change your treatment, we will call you to review the results.  Testing/Procedures: None   Follow-Up: At [facility], you and your health needs are our priority.  As part of our continuing mission to provide you with exceptional heart care, we have created designated Provider Care Teams.  These Care Teams include your primary Cardiologist (physician) and Advanced Practice Providers (APPs -  Physician Assistants and Nurse Practitioners) who all work together to provide you with the care you need, when you need it. You will need a follow up appointment in:  3 months.  Please call our office 2 months in advance to schedule this appointment.  You may see [name], MD or Richardson Dopp, PA-C   Any Other Special Instructions Will Be Listed Below (If Applicable).  I will look at the [facility] strip you send and let you know if there is anything we need to change.

## 2018-06-01 ENCOUNTER — Encounter

## 2018-06-04 ENCOUNTER — Other Ambulatory Visit: Payer: Self-pay | Admitting: Physician Assistant

## 2018-06-04 MED ORDER — METOPROLOL SUCCINATE ER 25 MG PO TB24: 25.0000 mg | ORAL_TABLET | Freq: Every day | ORAL | 3 refills | Status: DC

## 2018-06-09 ENCOUNTER — Other Ambulatory Visit: Payer: Self-pay | Admitting: Internal Medicine

## 2018-06-09 DIAGNOSIS — I10 Essential (primary) hypertension: Secondary | ICD-10-CM

## 2018-06-12 ENCOUNTER — Encounter: Payer: Self-pay | Admitting: Internal Medicine

## 2018-06-15 ENCOUNTER — Encounter: Payer: Self-pay | Admitting: Internal Medicine

## 2018-06-30 ENCOUNTER — Other Ambulatory Visit: Payer: Self-pay | Admitting: Cardiology

## 2018-07-03 ENCOUNTER — Encounter

## 2018-07-19 MED ORDER — NITROGLYCERIN 0.4 MG SL SUBL: SUBLINGUAL_TABLET | SUBLINGUAL | 3 refills | Status: DC

## 2018-07-19 NOTE — Telephone Encounter (Signed)
Spoke with patient. I wanted to see if his symptoms worsened or changed and whether he has been taking ntg more often previously. Pt only needs refill to keep is supply in current date. NTG refilled.

## 2018-07-23 ENCOUNTER — Telehealth: Payer: Self-pay | Admitting: *Deleted

## 2018-07-23 NOTE — Telephone Encounter (Signed)
Script Screening patients for COVID-19 and reviewing new operational procedures  Greeting - The reason I am calling is to share with you some new changes to our processes that are designed to help us keep everyone safe. Is now a good time to speak with you? Patient says "no' - ask them when you can call back and let them know it's important to do this prior to their appointment.  Patient says "yes" - Great, [place]  the first thing I need to do is ask you some screening Questions.  1. To the best of your knowledge, have you been in close contact with any one with a confirmed diagnosis of COVID 19? o No - proceed to next question  2. Have you had any one or more of the following: fever, chills, cough, shortness of breath or any flu-like symptoms? o No - proceed to next question  3. Have you been diagnosed with or have a previous diagnosis of COVID 19? o No - proceed to next question  4. I am going to go over a few other symptoms with you. Please let me know if you are experiencing any of the following: . Ear, nose or throat discomfort . A sore throat . Headache . Muscle pain . Diarrhea . Loss of taste or smell o No - proceed to next question  Thank you for answering these questions. Please know we will ask you these questions or similar questions when you arrive for your appointment and again it's how we are keeping everyone safe. Also, to keep you safe, please use the provided hand sanitizer when you enter the building. [place], we are asking everyone in the building to wear a mask because they help us prevent the spread of germs. Do you have a mask of your own, if not, we are happy to provide one for you. The last thing I want to go over with you is the no visitor guidelines. This means no one can attend the appointment with you unless you need physical assistance. I understand this may be different from your past appointments and I know this may be difficult but please know if  someone is driving you we are happy to call them for you once your appointment is over.  [INSERT SITE SPECIFIC CHECK IN PROCEDURES]  [place] I've given you a lot of information, what questions do you have about what I've talked about today or your appointment tomorrow? 

## 2018-07-24 ENCOUNTER — Ambulatory Visit (INDEPENDENT_AMBULATORY_CARE_PROVIDER_SITE_OTHER)
Admission: RE | Admit: 2018-07-24 | Discharge: 2018-07-24 | Disposition: A | Discharge status: Home / Self Care | Payer: BC Managed Care – PPO | Source: Ambulatory Visit | Attending: Pulmonary Disease | Admitting: Pulmonary Disease

## 2018-07-24 ENCOUNTER — Other Ambulatory Visit: Payer: Self-pay

## 2018-07-24 DIAGNOSIS — R918 Other nonspecific abnormal finding of lung field: Secondary | ICD-10-CM

## 2018-07-24 DIAGNOSIS — R911 Solitary pulmonary nodule: Secondary | ICD-10-CM | POA: Diagnosis not present

## 2018-08-03 ENCOUNTER — Other Ambulatory Visit: Payer: Self-pay | Admitting: Pulmonary Disease

## 2018-08-03 DIAGNOSIS — R911 Solitary pulmonary nodule: Secondary | ICD-10-CM

## 2018-08-15 ENCOUNTER — Other Ambulatory Visit: Payer: Self-pay

## 2018-08-15 ENCOUNTER — Encounter: Payer: Self-pay | Admitting: Pulmonary Disease

## 2018-08-15 ENCOUNTER — Ambulatory Visit (INDEPENDENT_AMBULATORY_CARE_PROVIDER_SITE_OTHER): Payer: BC Managed Care – PPO | Admitting: Pulmonary Disease

## 2018-08-15 VITALS — BP 124/68 | HR 66 | Temp 98.1°F | Ht 66.0 in | Wt 211.6 lb

## 2018-08-15 DIAGNOSIS — I25110 Atherosclerotic heart disease of native coronary artery with unstable angina pectoris: Secondary | ICD-10-CM

## 2018-08-15 DIAGNOSIS — R911 Solitary pulmonary nodule: Secondary | ICD-10-CM

## 2018-08-15 DIAGNOSIS — G4733 Obstructive sleep apnea (adult) (pediatric): Secondary | ICD-10-CM

## 2018-08-15 NOTE — Progress Notes (Signed)
Manuel Barker    [id number]    May 20, 1950  Primary Care Physician:Hernandez [name], MD  Referring Physician: [name], [name], MD [place],  [place] [address]  Chief complaint: Follow up for  Lung nodule Sleep apnea  HPI: 68 year old with history of hypertension, hyperlipidemia, OSA, prostate cancer.  He has an incidental finding of right lower lobe lung nodule found on CT scan of the abdomen in July 2018 which is being followed in the pulmonary clinic.    H/O prostate cancer in November 2017.  S/p proton therapy.  PSA has been low on follow-up surveillance.    Also has history of severe OSA.  Had a repeat sleep apnea test in 2019 which redemonstrated severe OSA.  Maintained on CPAP.  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  No mold exposure Smoking history:  no smoking history Travel History: No recent travel history  Interim history: Here for follow-up after 2 years. States that he is doing well on CPAP with no issues  Outpatient Encounter Medications as of 08/15/2018  Medication Sig  . amLODipine (NORVASC) 5 MG tablet Take 1 tablet (5 mg total) by mouth daily.  Marland Kitchen aspirin 81 MG tablet Take 1 tablet (81 mg total) by mouth daily.  . clopidogrel (PLAVIX) 75 MG tablet TAKE 1 TABLET(75 MG) BY MOUTH DAILY  . ezetimibe (ZETIA) 10 MG tablet TAKE 1 TABLET BY MOUTH ONCE DAILY  . metoprolol succinate (TOPROL-XL) 25 MG 24 hr tablet Take 1 tablet (25 mg total) by mouth daily.  . nitroGLYCERIN (NITROSTAT) 0.4 MG SL tablet place 1 tablet under the tongue if needed every 5 minutes for chest pain for 3 doses IF NO RELIEF AFTER FIRST DOSE CALL PRESCRIBER OR 911.  . rosuvastatin (CRESTOR) 40 MG tablet TAKE 1 TABLET BY MOUTH ONCE DAILY  . telmisartan (MICARDIS) 40 MG tablet Take one tablet daily   Facility-Administered Encounter Medications as of 08/15/2018  Medication  . sodium  phosphate (FLEET) 7-19 GM/118ML enema 1 enema   Physical Exam: Blood pressure 124/68, pulse 66, temperature 98.1 F (36.7 C), temperature source Oral, height 5\' 6"  (1.676 m), weight 211 lb 9.6 oz (96 kg), SpO2 97 %. Gen:      No acute distress HEENT:  EOMI, sclera anicteric Neck:     No masses; no thyromegaly Lungs:    Clear to auscultation bilaterally; normal respiratory effort CV:         Regular rate and rhythm; no murmurs Abd:      + bowel sounds; soft, non-tender; no palpable masses, no distension Ext:    No edema; adequate peripheral perfusion Skin:      Warm and dry; no rash Neuro: alert and oriented x 3 Psych: normal mood and affect  Data Reviewed: Imaging: CT abdomen pelvis 08/01/16-13 mm right lower lobe pulmonary nodule.  No lymphadenopathy, 8 mm lesion in the right kidney. PET scan 11/07/16-.  Low-grade in right lower lobe pulmonary nodule CT chest 06/01/2017-stable right lower lobe lung nodule CT chest 07/24/2018- stable right lower lobe lung nodule. I reviewed all images personally  Labs 10/26/16 ANA, ANCA, CCP, rheumatoid factor-negative Sed rate- 8, CRP 0.1 ACE levels pending  Sleep PSG 07/03/2017- severe OSA, AHI 53.3.  Titrated to CPAP at 15 cm  CPAP download 5/14-8/11/20: 93% compliance.  Set pressure 13 cm, residual AHI 14.9  Assessment:  Severe OSA Tolerating CPAP.  His  download shows that he is on pressure of 13 with AHI still up at 15 Will increase CPAP pressure to 15 as per his polysomnogram last year.  Lung nodule.  PET scan in 2019 shows low-grade activity.  This is remained stable on follow-up CT and is likely benign.   Order CT scan without contrast in 1 year.  Plan/Recommendations: - Increase CPAP pressure to 15.  Follow-up in 3 months. - Follow up CT in 12 months  Marshell Garfinkel MD [facility] Pager [phone] 08/15/2018, 10:25 AM  CC: [name], [place]*

## 2018-08-15 NOTE — Patient Instructions (Signed)
Your CT scan shows lung nodule is stable which is good news I have reviewed your CPAP download as well.  We will need to increase the CPAP pressure to 15 Follow-up in 3 months for review of download

## 2018-08-15 NOTE — Addendum Note (Signed)
Addended by: Hildred Alamin I on: 08/15/2018 11:18 AM   Modules accepted: Orders
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
## 2018-08-28 ENCOUNTER — Encounter: Payer: Self-pay | Admitting: Physician Assistant

## 2018-08-28 ENCOUNTER — Telehealth (INDEPENDENT_AMBULATORY_CARE_PROVIDER_SITE_OTHER): Payer: BC Managed Care – PPO | Admitting: Physician Assistant

## 2018-08-28 ENCOUNTER — Other Ambulatory Visit: Payer: Self-pay

## 2018-08-28 VITALS — BP 123/79 | HR 80 | Ht 66.0 in | Wt 205.0 lb

## 2018-08-28 DIAGNOSIS — I25118 Atherosclerotic heart disease of native coronary artery with other forms of angina pectoris: Secondary | ICD-10-CM | POA: Diagnosis not present

## 2018-08-28 DIAGNOSIS — E785 Hyperlipidemia, unspecified: Secondary | ICD-10-CM

## 2018-08-28 DIAGNOSIS — I10 Essential (primary) hypertension: Secondary | ICD-10-CM

## 2018-08-28 NOTE — Patient Instructions (Signed)
Medication Instructions:  Your physician recommends that you continue on your current medications as directed. Please refer to the Current Medication list given to you today.  If you need a refill on your cardiac medications before your next appointment, please call your pharmacy.   Lab work: TO BE DONE IN 6 MONTHS; 1 WEEK BEFORE APPOINTMENT: BMET, LFTS, LIPIDS  If you have labs (blood work) drawn today and your tests are completely normal, you will receive your results only by: Marland Kitchen MyChart Message (if you have MyChart) OR . A paper copy in the mail If you have any lab test that is abnormal or we need to change your treatment, we will call you to review the results.  Testing/Procedures: NONE  Follow-Up: At [facility], you and your health needs are our priority.  As part of our continuing mission to provide you with exceptional heart care, we have created designated Provider Care Teams.  These Care Teams include your primary Cardiologist (physician) and Advanced Practice Providers (APPs -  Physician Assistants and Nurse Practitioners) who all work together to provide you with the care you need, when you need it. You will need a follow up appointment in:  6 months.  Please call our office 2 months in advance to schedule this appointment.  You may see [name], MD or Richardson Dopp, PA-C   Any Other Special Instructions Will Be Listed Below (If Applicable).

## 2018-09-13 ENCOUNTER — Other Ambulatory Visit: Payer: Self-pay | Admitting: Internal Medicine

## 2018-09-29 ENCOUNTER — Other Ambulatory Visit: Payer: Self-pay

## 2018-09-29 ENCOUNTER — Ambulatory Visit (INDEPENDENT_AMBULATORY_CARE_PROVIDER_SITE_OTHER): Payer: BC Managed Care – PPO

## 2018-09-29 DIAGNOSIS — Z23 Encounter for immunization: Secondary | ICD-10-CM

## 2018-12-18 ENCOUNTER — Other Ambulatory Visit: Payer: Self-pay | Admitting: Internal Medicine

## 2018-12-18 DIAGNOSIS — I10 Essential (primary) hypertension: Secondary | ICD-10-CM

## 2019-02-14 ENCOUNTER — Telehealth: Payer: Self-pay | Admitting: Internal Medicine

## 2019-02-14 ENCOUNTER — Ambulatory Visit: Payer: BC Managed Care – PPO | Attending: Family

## 2019-02-14 DIAGNOSIS — Z23 Encounter for immunization: Secondary | ICD-10-CM | POA: Insufficient documentation

## 2019-02-14 NOTE — Telephone Encounter (Signed)

## 2019-02-14 NOTE — Progress Notes (Signed)
   Covid-19 Vaccination Clinic  Name:  Manuel Barker    MRN: [id number] DOB: Jun 08, 1950  02/14/2019  [name] was observed post Covid-19 immunization for 30 minutes based on pre-vaccination screening without incidence. He was provided with Vaccine Information Sheet and instruction to access the V-Safe system.   [name] was instructed to call 911 with any severe reactions post vaccine: Marland Kitchen Difficulty breathing  . Swelling of your face and throat  . A fast heartbeat  . A bad rash all over your body  . Dizziness and weakness    Immunizations Administered    Name Date Dose VIS Date Route   Moderna COVID-19 Vaccine 02/14/2019  1:06 PM 0.5 mL 12/04/2018 Intramuscular   Manufacturer: Moderna   Lot: [id number]   [id number]

## 2019-02-18 ENCOUNTER — Encounter: Payer: Self-pay | Admitting: Internal Medicine

## 2019-02-18 ENCOUNTER — Telehealth (INDEPENDENT_AMBULATORY_CARE_PROVIDER_SITE_OTHER): Payer: BC Managed Care – PPO | Admitting: Internal Medicine

## 2019-02-18 DIAGNOSIS — E782 Mixed hyperlipidemia: Secondary | ICD-10-CM

## 2019-02-18 DIAGNOSIS — I2510 Atherosclerotic heart disease of native coronary artery without angina pectoris: Secondary | ICD-10-CM

## 2019-02-18 DIAGNOSIS — I10 Essential (primary) hypertension: Secondary | ICD-10-CM | POA: Diagnosis not present

## 2019-02-18 NOTE — Patient Instructions (Signed)
Medication Instructions:  Your physician recommends that you continue on your current medications as directed. Please refer to the Current Medication list given to you today.  *If you need a refill on your cardiac medications before your next appointment, please call your pharmacy*  Lab Work: IN THE [place] OFFICE cbc, bmet, lipid, lft  If you have labs (blood work) drawn today and your tests are completely normal, you will receive your results only by: Marland Kitchen MyChart Message (if you have MyChart) OR . A paper copy in the mail If you have any lab test that is abnormal or we need to change your treatment, we will call you to review the results.  Testing/Procedures: None today  Follow-Up: At [facility], you and your health needs are our priority.  As part of our continuing mission to provide you with exceptional heart care, we have created designated Provider Care Teams.  These Care Teams include your primary Cardiologist (physician) and Advanced Practice Providers (APPs -  Physician Assistants and Nurse Practitioners) who all work together to provide you with the care you need, when you need it.  Your next appointment:   6 month(s)  The format for your next appointment:   In Person  Provider:   [name], MD  Other Instructions None      Thank you for choosing [facility] !
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
## 2019-03-12 ENCOUNTER — Other Ambulatory Visit: Payer: Self-pay | Admitting: Internal Medicine

## 2019-03-12 ENCOUNTER — Other Ambulatory Visit: Payer: Self-pay | Admitting: Physician Assistant

## 2019-03-19 ENCOUNTER — Other Ambulatory Visit: Payer: Self-pay | Admitting: Internal Medicine

## 2019-03-19 ENCOUNTER — Ambulatory Visit: Payer: BC Managed Care – PPO | Attending: Family

## 2019-03-19 DIAGNOSIS — I10 Essential (primary) hypertension: Secondary | ICD-10-CM

## 2019-03-19 DIAGNOSIS — Z23 Encounter for immunization: Secondary | ICD-10-CM

## 2019-03-19 NOTE — Progress Notes (Signed)
   Covid-19 Vaccination Clinic  Name:  Manuel Barker    MRN: [id number] DOB: 1950-06-26  03/19/2019  Mr. Blaske was observed post Covid-19 immunization for 30 minutes based on pre-vaccination screening without incident. He was provided with Vaccine Information Sheet and instruction to access the V-Safe system.   Mr. Hunsaker was instructed to call 911 with any severe reactions post vaccine: Marland Kitchen Difficulty breathing  . Swelling of face and throat  . A fast heartbeat  . A bad rash all over body  . Dizziness and weakness   Immunizations Administered    Name Date Dose VIS Date Route   Moderna COVID-19 Vaccine 03/19/2019 11:46 AM 0.5 mL 12/04/2018 Intramuscular   Manufacturer: Moderna   Lot: [id number]   [id number]

## 2019-05-27 DIAGNOSIS — C61 Malignant neoplasm of prostate: Secondary | ICD-10-CM | POA: Diagnosis not present

## 2019-05-27 DIAGNOSIS — R6882 Decreased libido: Secondary | ICD-10-CM | POA: Diagnosis not present

## 2019-05-28 ENCOUNTER — Telehealth: Payer: Self-pay | Admitting: Internal Medicine

## 2019-05-28 NOTE — Telephone Encounter (Signed)
New Message    Pt is calling to follow up on mychart message he sent to Dr Harrington Challenger    Please advise

## 2019-05-29 NOTE — Telephone Encounter (Signed)
Follow up    Pt is calling to follow up from his mychart message he sent to Dr Harrington Challenger    Please call back

## 2019-05-29 NOTE — Telephone Encounter (Signed)
Called patient and let him know Dr. Harrington Challenger is away and that I would ask a physician to review Kardia strips with me tomorrow.  There is one uploaded that says possible atrial fibrillation that looks sinus with PACs.  Pt did not notice any symptoms.  He felt normal.  This was one hour after his walk outside.  He is aware I will call him back tomorrow with further recommendations.

## 2019-06-10 ENCOUNTER — Other Ambulatory Visit: Payer: Self-pay | Admitting: Internal Medicine

## 2019-06-24 ENCOUNTER — Other Ambulatory Visit: Payer: Self-pay | Admitting: Internal Medicine

## 2019-06-24 DIAGNOSIS — I10 Essential (primary) hypertension: Secondary | ICD-10-CM

## 2019-07-10 ENCOUNTER — Other Ambulatory Visit: Payer: Self-pay | Admitting: Internal Medicine

## 2019-07-15 ENCOUNTER — Other Ambulatory Visit: Payer: Self-pay | Admitting: Internal Medicine

## 2019-07-23 ENCOUNTER — Other Ambulatory Visit: Payer: Self-pay

## 2019-07-23 ENCOUNTER — Other Ambulatory Visit: Payer: Self-pay | Admitting: Internal Medicine

## 2019-07-23 ENCOUNTER — Ambulatory Visit
Admission: RE | Admit: 2019-07-23 | Discharge: 2019-07-23 | Disposition: A | Discharge status: Home / Self Care | Payer: BC Managed Care – PPO | Source: Ambulatory Visit | Attending: Pulmonary Disease | Admitting: Pulmonary Disease

## 2019-07-23 DIAGNOSIS — R911 Solitary pulmonary nodule: Secondary | ICD-10-CM

## 2019-07-24 ENCOUNTER — Encounter: Payer: Self-pay | Admitting: Pulmonary Disease

## 2019-07-24 ENCOUNTER — Ambulatory Visit (INDEPENDENT_AMBULATORY_CARE_PROVIDER_SITE_OTHER): Payer: BC Managed Care – PPO | Admitting: Pulmonary Disease

## 2019-07-24 VITALS — BP 128/70 | HR 75 | Temp 98.2°F | Ht 66.0 in | Wt 205.8 lb

## 2019-07-24 DIAGNOSIS — R0602 Shortness of breath: Secondary | ICD-10-CM

## 2019-07-24 DIAGNOSIS — G4733 Obstructive sleep apnea (adult) (pediatric): Secondary | ICD-10-CM | POA: Diagnosis not present

## 2019-07-24 DIAGNOSIS — R911 Solitary pulmonary nodule: Secondary | ICD-10-CM | POA: Diagnosis not present

## 2019-07-24 NOTE — Progress Notes (Signed)
Manuel Barker    [id number]    [birth year]  Primary Care Physician:Hernandez [name], MD  Referring Physician: [name], [name], MD [place],  [place] [address]  Chief complaint: Follow up for  Lung nodule Sleep apnea  HPI: 69 year old with history of hypertension, hyperlipidemia, OSA, prostate cancer.  He has an incidental finding of right lower lobe lung nodule found on CT scan of the abdomen in July 2018 which is being followed in the pulmonary clinic.    H/O prostate cancer in November 2017.  S/p proton therapy.  PSA has been low on follow-up surveillance.    Also has history of severe OSA.  Had a repeat sleep apnea test in 2019 which redemonstrated severe OSA.  Maintained on CPAP.  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  No mold exposure Smoking history:  no smoking history Travel History: No recent travel history  Interim history: Doing well on CPAP without issue Here for review of CT scan.  Outpatient Encounter Medications as of 07/24/2019  Medication Sig  . amLODipine (NORVASC) 5 MG tablet TAKE 1 TABLET(5 MG) BY MOUTH DAILY  . aspirin 81 MG tablet Take 1 tablet (81 mg total) by mouth daily.  . clopidogrel (PLAVIX) 75 MG tablet TAKE 1 TABLET BY MOUTH DAILY. PLEASE MAKE YEARLY APPT FOR JANUARY WITH DOCTOR ROSS FOR FUTURE REFILLS  . ezetimibe (ZETIA) 10 MG tablet TAKE 1 TABLET BY MOUTH ONCE DAILY  . metoprolol succinate (TOPROL-XL) 25 MG 24 hr tablet TAKE 1 TABLET(25 MG) BY MOUTH DAILY  . nitroGLYCERIN (NITROSTAT) 0.4 MG SL tablet place 1 tablet under the tongue if needed every 5 minutes for chest pain for 3 doses IF NO RELIEF AFTER FIRST DOSE CALL PRESCRIBER OR 911.  . rosuvastatin (CRESTOR) 40 MG tablet TAKE 1 TABLET BY MOUTH ONCE DAILY  . telmisartan (MICARDIS) 40 MG tablet TAKE 1 TABLET BY MOUTH DAILY   Facility-Administered Encounter Medications as of  07/24/2019  Medication  . sodium phosphate (FLEET) 7-19 GM/118ML enema 1 enema   Physical Exam: Blood pressure 128/70, pulse 75, temperature 98.2 F (36.8 C), temperature source Oral, height 5\' 6"  (1.676 m), weight 205 lb 12.8 oz (93.4 kg), SpO2 96 %. Gen:      No acute distress HEENT:  EOMI, sclera anicteric Neck:     No masses; no thyromegaly Lungs:    Clear to auscultation bilaterally; normal respiratory effort CV:         Regular rate and rhythm; no murmurs Abd:      + bowel sounds; soft, non-tender; no palpable masses, no distension Ext:    No edema; adequate peripheral perfusion Skin:      Warm and dry; no rash Neuro: alert and oriented x 3 Psych: normal mood and affect  Data Reviewed: Imaging: CT abdomen pelvis 08/01/16-13 mm right lower lobe pulmonary nodule.  No lymphadenopathy, 8 mm lesion in the right kidney. PET scan 11/07/16-.  Low-grade in right lower lobe pulmonary nodule CT chest 06/01/2017-stable right lower lobe lung nodule CT chest 07/24/2018- stable right lower lobe lung nodule. CT chest 07/23/2019-right lower lobe lung nodule stable/minimally increased in size. I reviewed all images personally  Labs 10/26/16 ANA, ANCA, CCP, rheumatoid factor-negative Sed rate- 8, CRP 0.1 ACE levels pending  Sleep PSG 07/03/2017- severe OSA, AHI 53.3.  Titrated to CPAP at 15 cm CPAP download 5/14-8/11/20: 93% compliance.  Set pressure  13 cm, residual AHI 14.9  Assessment:  Severe OSA Tolerating CPAP with no issues.  Lung nodule.  PET scan in 2019 shows low-grade activity.  This is remained stable on follow-up CT though a low-grade tumor cannot be ruled out We had a detailed discussion with the patient today for options including continued monitoring versus PET scan, lung biopsy.  He is uncertain about going for the invasive procedure. We will discuss at multidisciplinary conference.  Plan/Recommendations: - Continue CPAP.  Get download for review - Discuss at  multidisciplinary tumor board  Marshell Garfinkel MD [facility] 07/24/2019, 9:01 AM  CC: [name], [place]*

## 2019-07-24 NOTE — Patient Instructions (Signed)
Schedule pulmonary function test for evaluation of lung function We will discuss case at multidisciplinary conference to determine the best way forward  I will get in touch with you regarding the discussion  Follow-up in 3 months

## 2019-07-24 NOTE — H&P (View-Only) (Signed)
Manuel Barker    [id number]    28-Nov-1950  Primary Care Physician:Manuel [name], MD  Referring Physician: Isaac Barker, Manuel Halsted, MD [place],  [place] [address]  Chief complaint: Follow up for  Lung nodule Sleep apnea  HPI: 69 year old with history of hypertension, hyperlipidemia, OSA, prostate cancer.  He has an incidental finding of right lower lobe lung nodule found on CT scan of the abdomen in July 2018 which is being followed in the pulmonary clinic.    H/O prostate cancer in November 2017.  S/p proton therapy.  PSA has been low on follow-up surveillance.    Also has history of severe OSA.  Had a repeat sleep apnea test in 2019 which redemonstrated severe OSA.  Maintained on CPAP.  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  No mold exposure Smoking history:  no smoking history Travel History: No recent travel history  Interim history: Doing well on CPAP without issue Here for review of CT scan.  Outpatient Encounter Medications as of 07/24/2019  Medication Sig  . amLODipine (NORVASC) 5 MG tablet TAKE 1 TABLET(5 MG) BY MOUTH DAILY  . aspirin 81 MG tablet Take 1 tablet (81 mg total) by mouth daily.  . clopidogrel (PLAVIX) 75 MG tablet TAKE 1 TABLET BY MOUTH DAILY. PLEASE MAKE YEARLY APPT FOR JANUARY WITH DOCTOR ROSS FOR FUTURE REFILLS  . ezetimibe (ZETIA) 10 MG tablet TAKE 1 TABLET BY MOUTH ONCE DAILY  . metoprolol succinate (TOPROL-XL) 25 MG 24 hr tablet TAKE 1 TABLET(25 MG) BY MOUTH DAILY  . nitroGLYCERIN (NITROSTAT) 0.4 MG SL tablet place 1 tablet under the tongue if needed every 5 minutes for chest pain for 3 doses IF NO RELIEF AFTER FIRST DOSE CALL PRESCRIBER OR 911.  . rosuvastatin (CRESTOR) 40 MG tablet TAKE 1 TABLET BY MOUTH ONCE DAILY  . telmisartan (MICARDIS) 40 MG tablet TAKE 1 TABLET BY MOUTH DAILY   Facility-Administered Encounter Medications as of  07/24/2019  Medication  . sodium phosphate (FLEET) 7-19 GM/118ML enema 1 enema   Physical Exam: Blood pressure 128/70, pulse 75, temperature 98.2 F (36.8 C), temperature source Oral, height 5\' 6"  (1.676 m), weight 205 lb 12.8 oz (93.4 kg), SpO2 96 %. Gen:      No acute distress HEENT:  EOMI, sclera anicteric Neck:     No masses; no thyromegaly Lungs:    Clear to auscultation bilaterally; normal respiratory effort CV:         Regular rate and rhythm; no murmurs Abd:      + bowel sounds; soft, non-tender; no palpable masses, no distension Ext:    No edema; adequate peripheral perfusion Skin:      Warm and dry; no rash Neuro: alert and oriented x 3 Psych: normal mood and affect  Data Reviewed: Imaging: CT abdomen pelvis 08/01/16-13 mm right lower lobe pulmonary nodule.  No lymphadenopathy, 8 mm lesion in the right kidney. PET scan 11/07/16-.  Low-grade in right lower lobe pulmonary nodule CT chest 06/01/2017-stable right lower lobe lung nodule CT chest 07/24/2018- stable right lower lobe lung nodule. CT chest 07/23/2019-right lower lobe lung nodule stable/minimally increased in size. I reviewed all images personally  Labs 10/26/16 ANA, ANCA, CCP, rheumatoid factor-negative Sed rate- 8, CRP 0.1 ACE levels pending  Sleep PSG 07/03/2017- severe OSA, AHI 53.3.  Titrated to CPAP at 15 cm CPAP download 5/14-8/11/20: 93% compliance.  Set pressure  13 cm, residual AHI 14.9  Assessment:  Severe OSA Tolerating CPAP with no issues.  Lung nodule.  PET scan in 2019 shows low-grade activity.  This is remained stable on follow-up CT though a low-grade tumor cannot be ruled out We had a detailed discussion with the patient today for options including continued monitoring versus PET scan, lung biopsy.  He is uncertain about going for the invasive procedure. We will discuss at multidisciplinary conference.  Plan/Recommendations: - Continue CPAP.  Get download for review - Discuss at  multidisciplinary tumor board  Manuel Garfinkel MD [facility] 07/24/2019, 9:01 AM  CC: Manuel Barker, Manuel Barker*

## 2019-07-26 ENCOUNTER — Ambulatory Visit (INDEPENDENT_AMBULATORY_CARE_PROVIDER_SITE_OTHER): Payer: BC Managed Care – PPO | Admitting: Pulmonary Disease

## 2019-07-26 ENCOUNTER — Other Ambulatory Visit: Payer: Self-pay

## 2019-07-26 DIAGNOSIS — R0602 Shortness of breath: Secondary | ICD-10-CM

## 2019-07-26 DIAGNOSIS — R911 Solitary pulmonary nodule: Secondary | ICD-10-CM

## 2019-07-26 LAB — PULMONARY FUNCTION TEST
DL/VA % pred: 125 %
DL/VA: 5.18 ml/min/mmHg/L
DLCO cor % pred: 111 %
DLCO cor: 27 ml/min/mmHg
DLCO unc % pred: 111 %
DLCO unc: 27 ml/min/mmHg
FEF 25-75 Post: 2.11 L/sec
FEF 25-75 Pre: 2.15 L/sec
FEF2575-%Change-Post: -1 %
FEF2575-%Pred-Post: 90 %
FEF2575-%Pred-Pre: 91 %
FEV1-%Change-Post: 2 %
FEV1-%Pred-Post: 94 %
FEV1-%Pred-Pre: 91 %
FEV1-Post: 2.5 L
FEV1-Pre: 2.44 L
FEV1FVC-%Change-Post: 6 %
FEV1FVC-%Pred-Pre: 99 %
FEV6-%Change-Post: -3 %
FEV6-%Pred-Post: 91 %
FEV6-%Pred-Pre: 94 %
FEV6-Post: 3.05 L
FEV6-Pre: 3.15 L
FEV6FVC-%Change-Post: 0 %
FEV6FVC-%Pred-Post: 103 %
FEV6FVC-%Pred-Pre: 104 %
FVC-%Change-Post: -3 %
FVC-%Pred-Post: 87 %
FVC-%Pred-Pre: 91 %
FVC-Post: 3.07 L
FVC-Pre: 3.19 L
Post FEV1/FVC ratio: 81 %
Post FEV6/FVC ratio: 99 %
Pre FEV1/FVC ratio: 76 %
Pre FEV6/FVC Ratio: 100 %
RV % pred: 88 %
RV: 2 L
TLC % pred: 82 %
TLC: 5.4 L

## 2019-07-26 NOTE — Progress Notes (Signed)
PFT done today. 

## 2019-08-01 ENCOUNTER — Telehealth: Payer: Self-pay | Admitting: Acute Care

## 2019-08-01 DIAGNOSIS — R911 Solitary pulmonary nodule: Secondary | ICD-10-CM

## 2019-08-01 NOTE — Telephone Encounter (Signed)
I presented this patient to the [facility] clinic this morning. Both radiology and Surgery do not like how this lesion looks. Even though it has had slow growth, they feel it is something that will continue to grow. They recommend Nav bronch. There is an anterior and posterior airway that runs by the area. Dr. Lamonte Sakai and I[facility] were on the call, so if you decide to move forward with the bronch they have both seen her imaging.  Thanks

## 2019-08-01 NOTE — Telephone Encounter (Signed)
-----   Message from Marshell Garfinkel, MD sent at 07/24/2019  9:16 AM EDT ----- Can you please put this patient with RLL nodule in multidisciplinary tumor board discussion for this week? Thanks

## 2019-08-02 ENCOUNTER — Telehealth: Payer: Self-pay | Admitting: Pulmonary Disease

## 2019-08-02 ENCOUNTER — Telehealth: Payer: Self-pay | Admitting: Internal Medicine

## 2019-08-02 NOTE — Telephone Encounter (Signed)
I left a voicemail message requesting callback from patient to discuss recommendations from [facility]

## 2019-08-02 NOTE — Telephone Encounter (Signed)
Spoke with the pt  He is asking for results of PFT and what the next steps for him are  Please advise thanks

## 2019-08-02 NOTE — Telephone Encounter (Signed)
Appointment scheduled for hypertension.

## 2019-08-02 NOTE — Telephone Encounter (Signed)
Plavix will need a 5 day wash out.  Dr. Lamonte Sakai is covering the floor service next week.  Could be done next Friday in endo?  Thanks  [name], DO [facility] 08/02/2019 6:15 PM

## 2019-08-02 NOTE — Telephone Encounter (Signed)
Pt called to say he needs the amLODipine (NORVASC) 5 MG tablet   Refilled he has about a week left of the medication.  Please advise

## 2019-08-02 NOTE — Telephone Encounter (Signed)
I called and discussed Manuel Barker recommendations for navigational biopsy with patient Discussed procedure with patient including risks and he is willing to proceed  He is on Plavix and thinks he may have had a stent thrombosis when Plavix was stopped in the past. We will need to get clearance to stop this from Dr. Harrington Challenger, cardiology  We will send message to Dr. Lamonte Sakai and Dr. Valeta Harms schedule if cleared by cardiology Super D  CT has been obtained  Marshell Garfinkel MD [facility] Please see [email or web address] for pager details.  08/02/2019, 5:03 PM

## 2019-08-02 NOTE — Telephone Encounter (Addendum)
Called pt and no

## 2019-08-05 NOTE — Telephone Encounter (Signed)
What procedure are we talking about ENB? EBUS? Flex simple bronch?  And has this patient been told to stop taking Plavix?

## 2019-08-05 NOTE — Telephone Encounter (Signed)
Procedure is ENB. Can you please contact Dr. [name], cardiology to get clearance before stopping Plavix.

## 2019-08-06 ENCOUNTER — Telehealth: Payer: Self-pay | Admitting: Internal Medicine

## 2019-08-06 NOTE — Telephone Encounter (Signed)
Called Dr. Harrington Challenger office and left message with pre-op team regarding the Plavix, once I get a reply will up date

## 2019-08-06 NOTE — Telephone Encounter (Signed)
Is there a dot phrase? To get the order started.

## 2019-08-06 NOTE — Telephone Encounter (Signed)
   [facility] Pre-operative Risk Assessment    HEARTCARE STAFF: - Please ensure there is not already an duplicate clearance open for this procedure. - Under Visit Info/Reason for Call, type in Other and utilize the format Clearance MM/DD/YY or Clearance TBD. Do not use dashes or single digits. - If request is for dental extraction, please clarify the # of teeth to be extracted.  Request for surgical clearance:  1. What type of surgery is being performed? EBUS  2. When is this surgery scheduled? TBD, want it scheduled ASAP  3. What type of clearance is required (medical clearance vs. Pharmacy clearance to hold med vs. Both)? Pharmacy  4. Are there any medications that need to be held prior to surgery and how long? Plavix, leaving up to cardiology  5. Practice name and name of physician performing surgery? [facility], Dr. Valeta Harms  6. What is the office phone number? [phone]   7.   What is the office fax number? [phone]  8.   Anesthesia type (None, local, MAC, general) ? general   Selena Zobro 08/06/2019, 9:23 AM  _________________________________________________________________   (provider comments below)

## 2019-08-07 NOTE — Addendum Note (Signed)
Addended by: [name] on: 08/07/2019 07:20 AM   Modules accepted: Orders

## 2019-08-07 NOTE — Telephone Encounter (Signed)
Dr. Harrington Challenger Mr Rathke with hs of CAD last CTO stents 2018 - on plavix and ASA.  He needs endobronchial ultrasound per pulmonary.  So they would like to hold ASA and plavix.   I called the pt and he has no chest pain and could walk up 2 flights of steps with no pain but will be SOB.  But he is worried because post procedure 2018 he developed chest pain and need cath and PCI so he would like to be seen.  We are arranging APP appt ASAP as pulmonary would like it done soon.  But did want your opinion on stopping asa and plavix, for 5 days and pt wanted your recommendations.   Thanks

## 2019-08-07 NOTE — Telephone Encounter (Signed)
I s/w the pt and informed him that the original plan from the cardiologist was to have pt come in for an appt for pre op clearance, though no availability. Ok per Dr. Harrington Challenger and pre op provider to have the pt come in tomorrow 8/5 or Friday 8/6 for an EKG for pre op clearance. I confirmed with pre op provider did they want the EKG to be taken to DOD or HIM Dept and have scanned which then could be sent to pre op provider tomorrow for review. I will not remove this out of pre op call back at this time. Pt is agreeable to come in tomorrow 8/5 @ 2 pm for a nurse visit for an EKG.

## 2019-08-07 NOTE — Telephone Encounter (Signed)
Sounds like bronch is urgent   I think it is OK to stop ASA / Plavix temporarily so that procedure can be done withoout signif increased risk of bleeding

## 2019-08-07 NOTE — Telephone Encounter (Signed)
I discussed with Dr. Harrington Challenger and pt.  He has no chest pain and chronic DOE climbing stairs.  Meets 4 METS of activity.  Would like to get EKG will have pre-op call back arrange for this week. If stable then could clear for procedure and he can hold ASA and plavix for 5 days.  Pt is agreeable to this plan.  Please have him keep follow up appt with Dr. Harrington Challenger 09/13/19

## 2019-08-07 NOTE — Telephone Encounter (Signed)
   Primary Cardiologist:Paula Harrington Challenger, MD  Chart reviewed as part of pre-operative protocol coverage. Because of KAMORI BARBIER past medical history and time since last visit, they will require a follow-up visit in order to better assess preoperative cardiovascular risk.  Pre-op covering staff:  appt ASAP  - Please schedule appointment and call patient to inform them. If patient already had an upcoming appointment within acceptable timeframe, please add "pre-op clearance" to the appointment notes so provider is aware. - Please contact requesting surgeon's office via preferred method (i.e, phone, fax) to inform them of need for appointment prior to surgery.  If applicable, this message will also be routed to pharmacy pool and/or primary cardiologist for input on holding anticoagulant/antiplatelet agent as requested below so that this information is available to the clearing provider at time of patient's appointment.   Cecilie Kicks, NP  08/07/2019, 2:48 PM

## 2019-08-07 NOTE — Telephone Encounter (Signed)
I placed order for PET scan

## 2019-08-07 NOTE — Addendum Note (Signed)
Addended by: Amado Coe on: 08/07/2019 09:07 AM   Modules accepted: Orders

## 2019-08-07 NOTE — Telephone Encounter (Addendum)
PCCM:  Orders placed. The earliest I can do this will be 8/17. While we are awaiting can we go ahead and order his PET scan?   [name], DO [facility] 08/07/2019 7:18 AM

## 2019-08-08 ENCOUNTER — Ambulatory Visit: Payer: BC Managed Care – PPO | Admitting: Internal Medicine

## 2019-08-08 ENCOUNTER — Encounter: Payer: Self-pay | Admitting: Internal Medicine

## 2019-08-08 ENCOUNTER — Ambulatory Visit (INDEPENDENT_AMBULATORY_CARE_PROVIDER_SITE_OTHER): Payer: BC Managed Care – PPO | Admitting: *Deleted

## 2019-08-08 ENCOUNTER — Other Ambulatory Visit: Payer: Self-pay

## 2019-08-08 VITALS — BP 110/70 | HR 63 | Temp 98.2°F | Ht 66.0 in | Wt 207.6 lb

## 2019-08-08 DIAGNOSIS — I25110 Atherosclerotic heart disease of native coronary artery with unstable angina pectoris: Secondary | ICD-10-CM

## 2019-08-08 DIAGNOSIS — C61 Malignant neoplasm of prostate: Secondary | ICD-10-CM | POA: Diagnosis not present

## 2019-08-08 DIAGNOSIS — E785 Hyperlipidemia, unspecified: Secondary | ICD-10-CM | POA: Diagnosis not present

## 2019-08-08 DIAGNOSIS — R911 Solitary pulmonary nodule: Secondary | ICD-10-CM | POA: Diagnosis not present

## 2019-08-08 DIAGNOSIS — I10 Essential (primary) hypertension: Secondary | ICD-10-CM

## 2019-08-08 DIAGNOSIS — Z01818 Encounter for other preprocedural examination: Secondary | ICD-10-CM

## 2019-08-08 MED ORDER — METOPROLOL SUCCINATE ER 25 MG PO TB24: ORAL_TABLET | ORAL | 1 refills | Status: DC

## 2019-08-08 MED ORDER — EZETIMIBE 10 MG PO TABS: 10.0000 mg | ORAL_TABLET | Freq: Every day | ORAL | 1 refills | Status: DC

## 2019-08-08 MED ORDER — ROSUVASTATIN CALCIUM 40 MG PO TABS: 40.0000 mg | ORAL_TABLET | Freq: Every day | ORAL | 1 refills | Status: DC

## 2019-08-08 MED ORDER — AMLODIPINE BESYLATE 5 MG PO TABS: ORAL_TABLET | ORAL | 1 refills | Status: DC

## 2019-08-08 MED ORDER — TELMISARTAN 40 MG PO TABS: 40.0000 mg | ORAL_TABLET | Freq: Every day | ORAL | 1 refills | Status: DC
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
## 2019-08-08 NOTE — Telephone Encounter (Signed)
Pt's EKG has been performed and has been uploaded to Epic.  Will route back to Preop pool to make them aware.

## 2019-08-08 NOTE — Telephone Encounter (Signed)
   Primary Cardiologist: [name], MD  Chart reviewed as part of pre-operative protocol coverage. Given past medical history and time since last visit, based on ACC/AHA guidelines, Manuel Barker would be at acceptable risk for the planned procedure without further cardiovascular testing.   His aspirin and Plavix may be held for 5 days prior to his procedure.  Please resume as soon as hemostasis is achieved.  I will route this recommendation to the requesting party via Epic fax function and remove from pre-op pool.  Please call with questions.  [name]. [name] NP-C    08/08/2019, 3:24 PM [place] [address] Office [phone] Fax [phone]

## 2019-08-08 NOTE — Progress Notes (Signed)
Patient here today for pre op EKG per Dr. Harrington Challenger.  EKG reviewed and compared to previous by Dr. Burt Knack.  Scanned into patient's chart.

## 2019-08-13 ENCOUNTER — Telehealth: Payer: Self-pay | Admitting: Internal Medicine

## 2019-08-13 NOTE — Telephone Encounter (Signed)
Dr. Mannam - please advise. Thanks. 

## 2019-08-13 NOTE — Telephone Encounter (Signed)
   [facility] Pre-operative Risk Assessment    HEARTCARE STAFF: - Please ensure there is not already an duplicate clearance open for this procedure. - Under Visit Info/Reason for Call, type in Other and utilize the format Clearance MM/DD/YY or Clearance TBD. Do not use dashes or single digits. - If request is for dental extraction, please clarify the # of teeth to be extracted.  Request for surgical clearance:  1. What type of surgery is being performed? bronchoscopy  2. When is this surgery scheduled? 08/20/19  3. What type of clearance is required (medical clearance vs. Pharmacy clearance to hold med vs. Both)? Pharmacy   4. Are there any medications that need to be held prior to surgery and how long? Stop Plavix for 5 days    5. Practice name and name of physician performing surgery? [facility]/ Dr. Vaughan Browner   6. What is the office phone number? [phone]   7.   What is the office fax number? [phone]  8.   Anesthesia type (None, local, MAC, general) ? Doesn't know    Ermelinda Das 08/13/2019, 9:20 AM  _________________________________________________________________   (provider comments below)

## 2019-08-13 NOTE — Telephone Encounter (Signed)
Called patient and asked him if he had heard from Dr. Alan Ripper office regarding when to stop plavix, he said Dr. Harrington Challenger told him it would be our office. I let him know that we wanted her recommendation first. He voiced understanding,   Patient is scheduled for Bronch on 08/20/19 so he would have to stop 07/15/19 if it were 5 days. Patient states he take in the am. I told him to hold off until the afternoon or he heard from our office when to stop so this would not delay or change the procedure he is already scheduled for on the 17th.   Will route to Dr. Vaughan Browner and Dr. Valeta Harms and Dr. Harrington Challenger

## 2019-08-13 NOTE — Telephone Encounter (Signed)
   Primary Cardiologist: [name], MD  Chart reviewed as part of pre-operative protocol coverage. Given past medical history and time since last visit, based on ACC/AHA guidelines, ZAKI GERTSCH would be at acceptable risk for the planned procedure without further cardiovascular testing.   OK to hold Plavix 5 days pre op.  I will route this recommendation to the requesting party via Epic fax function and remove from pre-op pool.  Please call with questions.  Kerin Ransom, PA-C 08/13/2019, 10:00 AM

## 2019-08-13 NOTE — Telephone Encounter (Signed)
Can we please recheck with Dr. Harrington Challenger, cardiology office as we will need clearence to hold plavix for 5 days before procedure next week

## 2019-08-13 NOTE — Telephone Encounter (Signed)
Called Dr Alan Ripper office and left another msg asking for clearance for stopping plavix x 5 days  Will await call back

## 2019-08-14 NOTE — Telephone Encounter (Signed)
OK to hold plavix   Intervention was a few years ago Resume if/when safe from pulmonary standpoint

## 2019-08-15 ENCOUNTER — Other Ambulatory Visit: Payer: Self-pay

## 2019-08-15 ENCOUNTER — Ambulatory Visit (HOSPITAL_COMMUNITY)
Admission: RE | Admit: 2019-08-15 | Discharge: 2019-08-15 | Disposition: A | Discharge status: Home / Self Care | Payer: BC Managed Care – PPO | Source: Ambulatory Visit | Attending: Pulmonary Disease | Admitting: Pulmonary Disease

## 2019-08-15 DIAGNOSIS — I700 Atherosclerosis of aorta: Secondary | ICD-10-CM | POA: Insufficient documentation

## 2019-08-15 DIAGNOSIS — R911 Solitary pulmonary nodule: Secondary | ICD-10-CM | POA: Diagnosis not present

## 2019-08-15 DIAGNOSIS — Z8546 Personal history of malignant neoplasm of prostate: Secondary | ICD-10-CM | POA: Diagnosis not present

## 2019-08-15 DIAGNOSIS — I2510 Atherosclerotic heart disease of native coronary artery without angina pectoris: Secondary | ICD-10-CM | POA: Insufficient documentation

## 2019-08-15 LAB — GLUCOSE, CAPILLARY: Glucose-Capillary: 99 mg/dL (ref 70–99)

## 2019-08-15 MED ORDER — FLUDEOXYGLUCOSE F - 18 (FDG) INJECTION
10.2000 | Freq: Once | INTRAVENOUS | Status: AC | PRN
  Administered 2019-08-15: 10.2 via INTRAVENOUS

## 2019-08-15 NOTE — Telephone Encounter (Signed)
Pt returning call, but had a question regarding medication for his upcoming biopsy.  Please advise.  [phone].

## 2019-08-15 NOTE — Telephone Encounter (Addendum)
OK to take aspirin until the day before procedure. Do not take aspirin on day of procedure.  Also let him know that PFTs look normal

## 2019-08-15 NOTE — Telephone Encounter (Signed)
Called and spoke with pt to make sure he had received the message from Dr. Alan Ripper office in regards to the plavix and he stated that he did. Stated to pt to not take it beginning today until after he has the biopsy and he verbalized understanding.  Pt wanted to know if it would be okay for him to continue to take the ASA or if he needed to stop taking that med as well. Dr. Vaughan Browner, please advise.

## 2019-08-15 NOTE — Telephone Encounter (Signed)
Correction to stopping plavix, on 08/15/19 not 07/15/19. Patient voiced understanding, nothing further needed at this time.

## 2019-08-15 NOTE — Telephone Encounter (Signed)
Patient called to verify about holding his plavix for 5 days.

## 2019-08-15 NOTE — Telephone Encounter (Signed)
Attempted to call pt but unable to reach. Left message for him to return call. °

## 2019-08-15 NOTE — Telephone Encounter (Signed)
Called and spoke with pt to make sure he had received the message from Dr. Alan Ripper office in regards to the plavix and he stated that he did. Stated to pt to not take it beginning today until after he has the biopsy and he verbalized understanding.

## 2019-08-15 NOTE — Telephone Encounter (Signed)
Spoke with the pt and notified of response per [name]  He verbalized understanding  Also aware stop plavix 5 days prior to procedure per Dr Harrington Challenger and [name]

## 2019-08-15 NOTE — Telephone Encounter (Signed)
Left detailed message on patient's vm ok to hold Plavix and resume when safe from pulmonary standpoint.  Asked that he contact pulmonary office to let them know.  Routing to pulmonary office.

## 2019-08-16 ENCOUNTER — Other Ambulatory Visit: Payer: Self-pay

## 2019-08-16 ENCOUNTER — Encounter (HOSPITAL_COMMUNITY): Payer: Self-pay | Admitting: Pulmonary Disease

## 2019-08-16 NOTE — Progress Notes (Signed)
Spoke with pt for pre-op call. Pt has extensive cardiac history. Cardiologist is Dr. Harrington Challenger. Cardiac clearance noted in Epic on 08/09/19. Pt is on Plavix and was instructed to hold Plavix 5 days prior to surgery. Last dose was 08/14/19 (am dose). Pt states he is continuing the 81 mg Aspirin because he was not instructed to stop that.  Covid test is scheduled for 08/17/19. Instructed pt to quarantine after having test done until he comes to the hospital Tuesday. He voiced understanding.

## 2019-08-17 ENCOUNTER — Other Ambulatory Visit (HOSPITAL_COMMUNITY)
Admission: RE | Admit: 2019-08-17 | Discharge: 2019-08-17 | Disposition: A | Discharge status: Home / Self Care | Payer: BC Managed Care – PPO | Source: Ambulatory Visit | Attending: Pulmonary Disease | Admitting: Pulmonary Disease

## 2019-08-17 DIAGNOSIS — Z01812 Encounter for preprocedural laboratory examination: Secondary | ICD-10-CM | POA: Diagnosis not present

## 2019-08-17 DIAGNOSIS — Z20822 Contact with and (suspected) exposure to covid-19: Secondary | ICD-10-CM | POA: Diagnosis not present

## 2019-08-17 LAB — SARS CORONAVIRUS 2 (TAT 6-24 HRS): SARS Coronavirus 2: NEGATIVE

## 2019-08-19 ENCOUNTER — Telehealth: Payer: Self-pay | Admitting: Pulmonary Disease

## 2019-08-19 NOTE — Telephone Encounter (Signed)
Thanks, we are not going after this percutaneously we are going after it bronchoscopically.  I will discuss with him tomorrow.  Thanks  [name], DO [facility] 08/19/2019 2:40 PM

## 2019-08-19 NOTE — Telephone Encounter (Signed)
Called and spoke with patient He is concerned with the report he saw from PET scan Patient worried about mass being close to diaphragm and if this was still the case,   I told him I would let Dr. Valeta Harms know the concern and get back to him as soon as I heard from Dr. Valeta Harms.   Dr. Valeta Harms please advise on PET scan results

## 2019-08-19 NOTE — Telephone Encounter (Signed)
Called spoke with patient.  Let him know Dr. Fabio Bering recommendations.  He states he verbalized understanding Nothing further needed at this time.

## 2019-08-19 NOTE — Telephone Encounter (Signed)
Pt returning missed call. Please advise. [phone]

## 2019-08-19 NOTE — Telephone Encounter (Signed)
ATC patient unable to reach VM full  (x1)

## 2019-08-19 NOTE — Anesthesia Preprocedure Evaluation (Addendum)
Anesthesia Evaluation  Patient identified by MRN, date of birth, ID band Patient awake    Reviewed: Allergy & Precautions, NPO status , Patient's Chart, lab work & pertinent test results, reviewed documented beta blocker date and time   History of Anesthesia Complications Negative for: history of anesthetic complications  Airway Mallampati: III  TM Distance: >3 FB Neck ROM: Full    Dental  (+) Dental Advisory Given   Pulmonary sleep apnea and Continuous Positive Airway Pressure Ventilation ,   Lung mass    Pulmonary exam normal        Cardiovascular hypertension, Pt. on home beta blockers and Pt. on medications + CAD, + Past MI and + Cardiac Stents  Normal cardiovascular exam   '18 Cath - Dist Cx lesion is 90% stenosed, instent restenosis. Scoring balloon angioplasty was performed using a BALLOON WOLVERINE 2.50X10, followed by PTCA witha 3.0 semicompliant balloon. Post intervention, there is a 0% residual stenosis. Ost Cx to Prox Cx lesion is 70% stenosed. Scoring balloon angioplasty was performed using a BALLOON WOLVERINE 2.50X10, followed by PTCA witha 3.0 semicompliant balloon. Post intervention, there is a 10% residual stenosis. Prox LAD lesion is 50% stenosed. Ost 1st Diag to 1st Diag lesion is 90% stenosed. Prox RCA lesion is 50% stenosed. Ost RPDA to RPDA lesion is 40% stenosed. Dist LAD lesion is 90% stenosed, unchanged from prior. The left ventricular ejection fraction is 50-55% by visual estimate. There is no aortic valve stenosis. The left ventricular systolic function is normal. LV end diastolic pressure is normal.  '18 TTE - moderate LVH. EF 65% to 70%. Trivial MR, mild TR.    Neuro/Psych Seizures -, Well Controlled,  negative psych ROS   GI/Hepatic negative GI ROS, Neg liver ROS,   Endo/Other  Obesity   Renal/GU negative Renal ROS     Musculoskeletal negative musculoskeletal  ROS (+)   Abdominal   Peds  Hematology  Plavix last 5 days ago    Anesthesia Other Findings Covid test negative   Reproductive/Obstetrics                            Anesthesia Physical Anesthesia Plan  ASA: III  Anesthesia Plan: General   Post-op Pain Management:    Induction: Intravenous  PONV Risk Score and Plan: 2 and Treatment may vary due to age or medical condition, Ondansetron and Dexamethasone  Airway Management Planned: Oral ETT  Additional Equipment: None  Intra-op Plan:   Post-operative Plan: Extubation in OR  Informed Consent: I have reviewed the patients History and Physical, chart, labs and discussed the procedure including the risks, benefits and alternatives for the proposed anesthesia with the patient or authorized representative who has indicated his/her understanding and acceptance.     Dental advisory given  Plan Discussed with: CRNA and Anesthesiologist  Anesthesia Plan Comments:        Anesthesia Quick Evaluation

## 2019-08-20 ENCOUNTER — Other Ambulatory Visit: Payer: Self-pay

## 2019-08-20 ENCOUNTER — Ambulatory Visit (HOSPITAL_COMMUNITY): Payer: BC Managed Care – PPO

## 2019-08-20 ENCOUNTER — Encounter (HOSPITAL_COMMUNITY): Payer: Self-pay | Admitting: Pulmonary Disease

## 2019-08-20 ENCOUNTER — Ambulatory Visit (HOSPITAL_COMMUNITY)
Admission: RE | Admit: 2019-08-20 | Discharge: 2019-08-20 | Disposition: A | Discharge status: Home / Self Care | Payer: BC Managed Care – PPO | Source: Ambulatory Visit | Attending: Pulmonary Disease | Admitting: Pulmonary Disease

## 2019-08-20 ENCOUNTER — Encounter (HOSPITAL_COMMUNITY)
Admission: RE | Disposition: A | Discharge status: Home / Self Care | Payer: Self-pay | Source: Ambulatory Visit | Attending: Pulmonary Disease

## 2019-08-20 ENCOUNTER — Ambulatory Visit (HOSPITAL_COMMUNITY): Payer: BC Managed Care – PPO | Admitting: Anesthesiology

## 2019-08-20 DIAGNOSIS — I10 Essential (primary) hypertension: Secondary | ICD-10-CM | POA: Insufficient documentation

## 2019-08-20 DIAGNOSIS — Z79899 Other long term (current) drug therapy: Secondary | ICD-10-CM | POA: Insufficient documentation

## 2019-08-20 DIAGNOSIS — Z7982 Long term (current) use of aspirin: Secondary | ICD-10-CM | POA: Diagnosis not present

## 2019-08-20 DIAGNOSIS — E785 Hyperlipidemia, unspecified: Secondary | ICD-10-CM | POA: Diagnosis not present

## 2019-08-20 DIAGNOSIS — G4733 Obstructive sleep apnea (adult) (pediatric): Secondary | ICD-10-CM | POA: Diagnosis not present

## 2019-08-20 DIAGNOSIS — R911 Solitary pulmonary nodule: Secondary | ICD-10-CM | POA: Diagnosis not present

## 2019-08-20 DIAGNOSIS — Z7902 Long term (current) use of antithrombotics/antiplatelets: Secondary | ICD-10-CM | POA: Insufficient documentation

## 2019-08-20 DIAGNOSIS — Z8546 Personal history of malignant neoplasm of prostate: Secondary | ICD-10-CM | POA: Insufficient documentation

## 2019-08-20 DIAGNOSIS — Z9889 Other specified postprocedural states: Secondary | ICD-10-CM

## 2019-08-20 DIAGNOSIS — I252 Old myocardial infarction: Secondary | ICD-10-CM | POA: Insufficient documentation

## 2019-08-20 DIAGNOSIS — E669 Obesity, unspecified: Secondary | ICD-10-CM | POA: Diagnosis not present

## 2019-08-20 DIAGNOSIS — Z6832 Body mass index (BMI) 32.0-32.9, adult: Secondary | ICD-10-CM | POA: Diagnosis not present

## 2019-08-20 DIAGNOSIS — R918 Other nonspecific abnormal finding of lung field: Secondary | ICD-10-CM | POA: Diagnosis not present

## 2019-08-20 DIAGNOSIS — I2510 Atherosclerotic heart disease of native coronary artery without angina pectoris: Secondary | ICD-10-CM | POA: Diagnosis not present

## 2019-08-20 HISTORY — PX: BRONCHIAL BRUSHINGS: SHX5108

## 2019-08-20 HISTORY — PX: BRONCHIAL NEEDLE ASPIRATION BIOPSY: SHX5106

## 2019-08-20 HISTORY — DX: Dyspnea, unspecified: R06.00

## 2019-08-20 HISTORY — PX: BRONCHIAL BIOPSY: SHX5109

## 2019-08-20 HISTORY — PX: BRONCHIAL WASHINGS: SHX5105

## 2019-08-20 HISTORY — DX: Pneumonia, unspecified organism: J18.9

## 2019-08-20 HISTORY — PX: FIDUCIAL MARKER PLACEMENT: SHX6858

## 2019-08-20 HISTORY — PX: VIDEO BRONCHOSCOPY WITH ENDOBRONCHIAL NAVIGATION: SHX6175

## 2019-08-20 LAB — CBC
HCT: 45.3 % (ref 39.0–52.0)
Hemoglobin: 14.3 g/dL (ref 13.0–17.0)
MCH: 28 pg (ref 26.0–34.0)
MCHC: 31.6 g/dL (ref 30.0–36.0)
MCV: 88.6 fL (ref 80.0–100.0)
Platelets: 189 10*3/uL (ref 150–400)
RBC: 5.11 MIL/uL (ref 4.22–5.81)
RDW: 13.7 % (ref 11.5–15.5)
WBC: 6.1 10*3/uL (ref 4.0–10.5)
nRBC: 0 % (ref 0.0–0.2)

## 2019-08-20 LAB — BASIC METABOLIC PANEL
Anion gap: 9 (ref 5–15)
BUN: 9 mg/dL (ref 8–23)
CO2: 25 mmol/L (ref 22–32)
Calcium: 8.9 mg/dL (ref 8.9–10.3)
Chloride: 104 mmol/L (ref 98–111)
Creatinine, Ser: 0.94 mg/dL (ref 0.61–1.24)
GFR calc Af Amer: >60 mL/min (ref >60)
GFR calc non Af Amer: >60 mL/min (ref >60)
Glucose, Bld: 104 mg/dL — ABNORMAL HIGH (ref 70–99)
Potassium: 3.8 mmol/L (ref 3.5–5.1)
Sodium: 138 mmol/L (ref 135–145)

## 2019-08-20 SURGERY — VIDEO BRONCHOSCOPY WITH ENDOBRONCHIAL NAVIGATION
Anesthesia: General

## 2019-08-20 MED ORDER — MIDAZOLAM HCL 5 MG/5ML IJ SOLN
INTRAMUSCULAR | Status: DC | PRN
  Administered 2019-08-20: 1 mg via INTRAVENOUS

## 2019-08-20 MED ORDER — SUGAMMADEX SODIUM 200 MG/2ML IV SOLN
INTRAVENOUS | Status: DC | PRN
  Administered 2019-08-20: 200 mg via INTRAVENOUS

## 2019-08-20 MED ORDER — FENTANYL CITRATE (PF) 100 MCG/2ML IJ SOLN: 25.0000 ug | INTRAMUSCULAR | Status: DC | PRN

## 2019-08-20 MED ORDER — CHLORHEXIDINE GLUCONATE 0.12 % MT SOLN
OROMUCOSAL | Status: AC
  Administered 2019-08-20: 15 mL
  Filled 2019-08-20: qty 15

## 2019-08-20 MED ORDER — SUCCINYLCHOLINE CHLORIDE 200 MG/10ML IV SOSY
PREFILLED_SYRINGE | INTRAVENOUS | Status: DC | PRN
  Administered 2019-08-20: 120 mg via INTRAVENOUS

## 2019-08-20 MED ORDER — ONDANSETRON HCL 4 MG/2ML IJ SOLN
INTRAMUSCULAR | Status: DC | PRN
  Administered 2019-08-20: 4 mg via INTRAVENOUS

## 2019-08-20 MED ORDER — PROPOFOL 10 MG/ML IV BOLUS
INTRAVENOUS | Status: DC | PRN
  Administered 2019-08-20: 120 mg via INTRAVENOUS

## 2019-08-20 MED ORDER — ROCURONIUM BROMIDE 10 MG/ML (PF) SYRINGE
PREFILLED_SYRINGE | INTRAVENOUS | Status: DC | PRN
  Administered 2019-08-20: 30 mg via INTRAVENOUS

## 2019-08-20 MED ORDER — OXYCODONE HCL 5 MG PO TABS: 5.0000 mg | ORAL_TABLET | Freq: Once | ORAL | Status: DC | PRN

## 2019-08-20 MED ORDER — OXYCODONE HCL 5 MG/5ML PO SOLN: 5.0000 mg | Freq: Once | ORAL | Status: DC | PRN

## 2019-08-20 MED ORDER — DEXAMETHASONE SODIUM PHOSPHATE 10 MG/ML IJ SOLN
INTRAMUSCULAR | Status: DC | PRN
  Administered 2019-08-20: 4 mg via INTRAVENOUS

## 2019-08-20 MED ORDER — PHENYLEPHRINE 40 MCG/ML (10ML) SYRINGE FOR IV PUSH (FOR BLOOD PRESSURE SUPPORT)
PREFILLED_SYRINGE | INTRAVENOUS | Status: DC | PRN
  Administered 2019-08-20 (×2): 80 ug via INTRAVENOUS

## 2019-08-20 MED ORDER — FENTANYL CITRATE (PF) 250 MCG/5ML IJ SOLN
INTRAMUSCULAR | Status: DC | PRN
  Administered 2019-08-20: 50 ug via INTRAVENOUS

## 2019-08-20 MED ORDER — PHENYLEPHRINE HCL-NACL 10-0.9 MG/250ML-% IV SOLN
INTRAVENOUS | Status: DC | PRN
Start: 2019-08-20 — End: 2019-08-20
  Administered 2019-08-20: 20 ug/min via INTRAVENOUS

## 2019-08-20 MED ORDER — LIDOCAINE 2% (20 MG/ML) 5 ML SYRINGE
INTRAMUSCULAR | Status: DC | PRN
  Administered 2019-08-20: 60 mg via INTRAVENOUS

## 2019-08-20 MED ORDER — LACTATED RINGERS IV SOLN: INTRAVENOUS | Status: DC

## 2019-08-20 MED ORDER — ONDANSETRON HCL 4 MG/2ML IJ SOLN: 4.0000 mg | Freq: Once | INTRAMUSCULAR | Status: DC | PRN

## 2019-08-20 SURGICAL SUPPLY — 48 items
ADAPTER BRONCH F/PENTAX (ADAPTER) ×4 IMPLANT
ADAPTER VALVE BIOPSY EBUS (MISCELLANEOUS) IMPLANT
ADPR BSCP EDG PNTX (ADAPTER) ×2
ADPTR VALVE BIOPSY EBUS (MISCELLANEOUS)
BRUSH CYTOL CELLEBRITY 1.5X140 (MISCELLANEOUS) ×4 IMPLANT
BRUSH SUPERTRAX BIOPSY (INSTRUMENTS) IMPLANT
BRUSH SUPERTRAX NDL-TIP CYTO (INSTRUMENTS) ×4 IMPLANT
CANISTER SUCT 3000ML PPV (MISCELLANEOUS) ×4 IMPLANT
CHANNEL WORK EXTEND EDGE 180 (KITS) IMPLANT
CHANNEL WORK EXTEND EDGE 45 (KITS) IMPLANT
CHANNEL WORK EXTEND EDGE 90 (KITS) IMPLANT
CONT SPEC 4OZ CLIKSEAL STRL BL (MISCELLANEOUS) ×4 IMPLANT
COVER BACK TABLE 60X90IN (DRAPES) ×4 IMPLANT
FILTER STRAW FLUID ASPIR (MISCELLANEOUS) IMPLANT
FORCEPS BIOP SUPERTRX PREMAR (INSTRUMENTS) ×4 IMPLANT
GAUZE SPONGE 4X4 12PLY STRL (GAUZE/BANDAGES/DRESSINGS) ×4 IMPLANT
GLOVE SURG SS PI 7.5 STRL IVOR (GLOVE) ×8 IMPLANT
GOWN STRL REUS W/ TWL LRG LVL3 (GOWN DISPOSABLE) ×4 IMPLANT
GOWN STRL REUS W/TWL LRG LVL3 (GOWN DISPOSABLE) ×8
KIT CLEAN ENDO COMPLIANCE (KITS) ×4 IMPLANT
KIT LOCATABLE GUIDE (CANNULA) IMPLANT
KIT MARKER FIDUCIAL DELIVERY (KITS) IMPLANT
KIT PROCEDURE EDGE 180 (KITS) IMPLANT
KIT PROCEDURE EDGE 45 (KITS) IMPLANT
KIT PROCEDURE EDGE 90 (KITS) IMPLANT
KIT TURNOVER KIT B (KITS) ×4 IMPLANT
MARKER SKIN DUAL TIP RULER LAB (MISCELLANEOUS) ×4 IMPLANT
NDL SUPERTRX PREMARK BIOPSY (NEEDLE) ×2 IMPLANT
NEEDLE SUPERTRX PREMARK BIOPSY (NEEDLE) ×4 IMPLANT
NS IRRIG 1000ML POUR BTL (IV SOLUTION) ×4 IMPLANT
OIL SILICONE PENTAX (PARTS (SERVICE/REPAIRS)) ×4 IMPLANT
PAD ARMBOARD 7.5X6 YLW CONV (MISCELLANEOUS) ×8 IMPLANT
PATCHES PATIENT (LABEL) ×12 IMPLANT
SOL ANTI FOG 6CC (MISCELLANEOUS) ×2 IMPLANT
SOLUTION ANTI FOG 6CC (MISCELLANEOUS) ×2
SYR 20CC LL (SYRINGE) ×4 IMPLANT
SYR 20ML ECCENTRIC (SYRINGE) ×4 IMPLANT
SYR 50ML SLIP (SYRINGE) ×4 IMPLANT
Superlock fiducial marker ×2 IMPLANT
TOWEL OR 17X24 6PK STRL BLUE (TOWEL DISPOSABLE) ×4 IMPLANT
TRAP SPECIMEN MUCOUS 40CC (MISCELLANEOUS) IMPLANT
TUBE CONNECTING 20'X1/4 (TUBING) ×1
TUBE CONNECTING 20X1/4 (TUBING) ×3 IMPLANT
UNDERPAD 30X30 (UNDERPADS AND DIAPERS) ×4 IMPLANT
VALVE BIOPSY  SINGLE USE (MISCELLANEOUS) ×4
VALVE BIOPSY SINGLE USE (MISCELLANEOUS) ×2 IMPLANT
VALVE SUCTION BRONCHIO DISP (MISCELLANEOUS) ×4 IMPLANT
WATER STERILE IRR 1000ML POUR (IV SOLUTION) ×4 IMPLANT

## 2019-08-20 NOTE — Transfer of Care (Signed)
Immediate Anesthesia Transfer of Care Note  Patient: ORI TREJOS  Procedure(s) Performed: VIDEO BRONCHOSCOPY WITH ENDOBRONCHIAL NAVIGATION (N/A ) BRONCHIAL BRUSHINGS BRONCHIAL BIOPSIES BRONCHIAL NEEDLE ASPIRATION BIOPSIES FIDUCIAL MARKER PLACEMENT BRONCHIAL WASHINGS  Patient Location: PACU  Anesthesia Type:General  Level of Consciousness: awake, alert  and oriented  Airway & Oxygen Therapy: Patient Spontanous Breathing and Patient connected to face mask oxygen  Post-op Assessment: Report given to RN and Post -op Vital signs reviewed and stable  Post vital signs: Reviewed and stable  Last Vitals:  Vitals Value Taken Time  BP    Temp    Pulse    Resp    SpO2      Last Pain:  Vitals:   08/20/19 0621  TempSrc: Temporal  PainSc: 0-No pain         Complications: No complications documented.

## 2019-08-20 NOTE — Op Note (Signed)
Video Bronchoscopy with Electromagnetic Navigation and fiducial marking procedure Note  Date of Operation: 08/20/2019  Pre-op Diagnosis: Right lower lobe lung nodule  Post-op Diagnosis: Right lower lobe lung nodule  Surgeon: [name], DO   Assistants: none   Anesthesia: General endotracheal anesthesia  Operation: Flexible video fiberoptic bronchoscopy with electromagnetic navigation and biopsies.  Estimated Blood Loss: Minimal  Complications: None   Indications and History: Manuel Barker is a 69 y.o. male with right lower lobe lung nodule.  The risks, benefits, complications, treatment options and expected outcomes were discussed with the patient.  The possibilities of pneumothorax, pneumonia, reaction to medication, pulmonary aspiration, perforation of a viscus, bleeding, failure to diagnose a condition and creating a complication requiring transfusion or operation were discussed with the patient who freely signed the consent.    Description of Procedure: The patient was seen in the Preoperative Area, was examined and was deemed appropriate to proceed.  The patient was taken to see endoscopy room 2, identified as [place] [facility] and the procedure verified as Flexible Video Fiberoptic Bronchoscopy.  A Time Out was held and the above information confirmed.   Prior to the date of the procedure a high-resolution CT scan of the chest was performed. Utilizing [place] a virtual tracheobronchial tree was generated to allow the creation of distinct navigation pathways to the patient's parenchymal abnormalities. After being taken to the operating room general anesthesia was initiated and the patient  was orally intubated. The video fiberoptic bronchoscope was introduced via the endotracheal tube and a general inspection was performed which showed normal right and left lung and have any no evidence of endobronchial disease. The extendable working channel and locator guide were  introduced into the bronchoscope. The distinct navigation pathways prepared prior to this procedure were then utilized to navigate to within 1.8 cm of patient's lesion(s) identified on CT scan.  A complete fluoroscopic sweep was used for local registration with an inspiratory breath-hold at 25 mmHg from RAO 25 degrees to LAO 25 degrees. The extendable working channel was secured into place and the locator guide was withdrawn. Under fluoroscopic guidance transbronchial needle brushings, transbronchial Wang needle biopsies, and transbronchial forceps biopsies were performed to be sent for cytology and pathology.  Following tissue sampling a single fiducial marker was placed within the approximate region of the lesion. A bronchioalveolar lavage was performed in the right lower lobe and sent for cytology. At the end of the procedure a general airway inspection was performed and there was no evidence of active bleeding. The bronchoscope was removed.  The patient tolerated the procedure well. There was no significant blood loss and there were no obvious complications. A post-procedural chest x-ray is pending.  Samples: 1. Transbronchial needle brushings from right lower lobe 2. Transbronchial Wang needle biopsies from right lower lobe 3. Transbronchial forceps biopsies from right lower lobe 4. Bronchoalveolar lavage from right lower lobe  Plans:  The patient will be discharged from the PACU to home when recovered from anesthesia and after chest x-ray is reviewed. We will review the cytology, pathology and microbiology results with the patient when they become available. Outpatient followup will be with Marshell Garfinkel, MD.   [name], DO [facility] 08/20/2019 9:00 AM

## 2019-08-20 NOTE — Anesthesia Procedure Notes (Signed)
Procedure Name: Intubation Date/Time: 08/20/2019 7:32 AM Performed by: Wilburn Cornelia, CRNA Pre-anesthesia Checklist: Patient identified, Emergency Drugs available, Suction available, Patient being monitored and Timeout performed Patient Re-evaluated:Patient Re-evaluated prior to induction Oxygen Delivery Method: Circle system utilized Preoxygenation: Pre-oxygenation with 100% oxygen Induction Type: IV induction Ventilation: Mask ventilation without difficulty Grade View: Grade I Tube type: Oral Tube size: 8.5 mm Number of attempts: 1 Airway Equipment and Method: Stylet and Video-laryngoscopy Placement Confirmation: ETT inserted through vocal cords under direct vision,  positive ETCO2,  CO2 detector and breath sounds checked- equal and bilateral Secured at: 23 cm Tube secured with: Tape Dental Injury: Teeth and Oropharynx as per pre-operative assessment  Comments: Glidescope intubation due to prominent upper teeth - Atraumatic intubation, lips and teeth intact

## 2019-08-20 NOTE — Discharge Instructions (Signed)
Flexible Bronchoscopy, Care After This sheet gives you information about how to care for yourself after your test. Your doctor may also give you more specific instructions. If you have problems or questions, contact your doctor. Follow these instructions at home: Eating and drinking  The day after the test, go back to your normal diet. Driving  Do not drive for 24 hours if you were given a medicine to help you relax (sedative).  Do not drive or use heavy machinery while taking prescription pain medicine. General instructions   Take over-the-counter and prescription medicines only as told by your doctor.  Return to your normal activities as told. Ask what activities are safe for you.  Do not use any products that have nicotine or tobacco in them. This includes cigarettes and e-cigarettes. If you need help quitting, ask your doctor.  Keep all follow-up visits as told by your doctor. This is important. It is very important if you had a tissue sample (biopsy) taken. Get help right away if:  You have shortness of breath that gets worse.  You get light-headed.  You feel like you are going to pass out (faint).  You have chest pain.  You cough up: ? More than a little blood. ? More blood than before. Summary  Do not eat or drink anything (not even water) for 2 hours after your test, or until your numbing medicine wears off.  Do not use cigarettes. Do not use e-cigarettes.  Get help right away if you have chest pain. This information is not intended to replace advice given to you by your health care provider. Make sure you discuss any questions you have with your health care provider. Document Revised: 12/02/2016 Document Reviewed: 01/08/2016 Elsevier Patient Education  2020 [employer].

## 2019-08-20 NOTE — Interval H&P Note (Signed)
History and Physical Interval Note:  08/20/2019 7:24 AM  Manuel Barker  has presented today for surgery, with the diagnosis of RIGHT LOWER LOPE  LUNG MASS.  The various methods of treatment have been discussed with the patient and family. After consideration of risks, benefits and other options for treatment, the patient has consented to  Procedure(s): [place] (N/A) as a surgical intervention.  The patient's history has been reviewed, patient examined, no change in status, stable for surgery.  I have reviewed the patient's chart and labs.  Questions were answered to the patient's satisfaction.    Patient seen and evaluated in preop. We discussed risks, benefits and alternatives of procedure to include bleeding, ptx and death. He is agreeable to proceed.   [place]

## 2019-08-20 NOTE — Anesthesia Postprocedure Evaluation (Signed)
Anesthesia Post Note  Patient: Manuel Barker  Procedure(s) Performed: VIDEO BRONCHOSCOPY WITH ENDOBRONCHIAL NAVIGATION (N/A ) BRONCHIAL BRUSHINGS BRONCHIAL BIOPSIES BRONCHIAL NEEDLE ASPIRATION BIOPSIES FIDUCIAL MARKER PLACEMENT BRONCHIAL WASHINGS     Patient location during evaluation: PACU Anesthesia Type: General Level of consciousness: awake and alert Pain management: pain level controlled Vital Signs Assessment: post-procedure vital signs reviewed and stable Respiratory status: spontaneous breathing, nonlabored ventilation and respiratory function stable Cardiovascular status: blood pressure returned to baseline and stable Postop Assessment: no apparent nausea or vomiting Anesthetic complications: no   No complications documented.  Last Vitals:  Vitals:   08/20/19 0920 08/20/19 0935  BP: 113/70 111/67  Pulse: 69 66  Resp: (!) 22 (!) 23  Temp:  36.8 C  SpO2: 96% 97%    Last Pain:  Vitals:   08/20/19 0935  TempSrc:   PainSc: 0-No pain                 Audry Pili

## 2019-08-21 LAB — CYTOLOGY - NON PAP

## 2019-08-21 LAB — SURGICAL PATHOLOGY

## 2019-08-22 ENCOUNTER — Encounter (HOSPITAL_COMMUNITY): Payer: Self-pay | Admitting: Pulmonary Disease

## 2019-08-28 ENCOUNTER — Other Ambulatory Visit: Payer: Self-pay

## 2019-08-28 ENCOUNTER — Ambulatory Visit: Payer: BC Managed Care – PPO | Admitting: Pulmonary Disease

## 2019-08-28 ENCOUNTER — Encounter: Payer: Self-pay | Admitting: Pulmonary Disease

## 2019-08-28 VITALS — BP 112/72 | HR 66 | Temp 97.3°F | Ht 66.0 in | Wt 204.7 lb

## 2019-08-28 DIAGNOSIS — G4733 Obstructive sleep apnea (adult) (pediatric): Secondary | ICD-10-CM | POA: Diagnosis not present

## 2019-08-28 DIAGNOSIS — R05 Cough: Secondary | ICD-10-CM

## 2019-08-28 DIAGNOSIS — R059 Cough, unspecified: Secondary | ICD-10-CM | POA: Insufficient documentation

## 2019-08-28 DIAGNOSIS — R911 Solitary pulmonary nodule: Secondary | ICD-10-CM

## 2019-08-28 DIAGNOSIS — Z9989 Dependence on other enabling machines and devices: Secondary | ICD-10-CM
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
## 2019-08-28 NOTE — Assessment & Plan Note (Signed)
Plan: Continue CPAP therapy Follow-up in 6 months 

## 2019-08-28 NOTE — Patient Instructions (Addendum)
You were seen today by Lauraine Rinne, NP  for:   1. OSA on CPAP  We recommend that you continue using your CPAP daily >>>Keep up the hard work using your device >>> Goal should be wearing this for the entire night that you are sleeping, at least 4 to 6 hours  Remember:  . Do not drive or operate heavy machinery if tired or drowsy.  . Please notify the supply company and office if you are unable to use your device regularly due to missing supplies or machine being broken.  . Work on maintaining a healthy weight and following your recommended nutrition plan  . Maintain proper daily exercise and movement  . Maintaining proper use of your device can also help improve management of other chronic illnesses such as: Blood pressure, blood sugars, and weight management.   BiPAP/ CPAP Cleaning:  >>>Clean weekly, with Dawn soap, and bottle brush.  Set up to air dry. >>> Wipe mask out daily with wet wipe or towelette    2. Pulmonary nodule  We will plan on repeating a CT of your chest in 6 months  3. Cough  I suspect that based off the pattern of the cough that you are describing this is likely more allergies  If the cough presents itself again please start taking:  Please start taking a daily antihistamine:  >>>choose one of: zyrtec, claritin, allegra, or xyzal  >>>these are over the counter medications  >>>can choose generic option  >>>take daily  >>>this medication helps with allergies, post nasal drip, and cough   You can also contact our office and let our office know so we can see you when you are actively having the cough  Follow Up:    Return in about 6 months (around 02/28/2020), or if symptoms worsen or fail to improve, for Follow up with Dr. Vaughan Browner, After Chest CT.   Please do your part to reduce the spread of COVID-19:      Reduce your risk of any infection  and COVID19 by using the similar precautions used for avoiding the common cold or flu:  Marland Kitchen Wash your hands  often with soap and warm water for at least 20 seconds.  If soap and water are not readily available, use an alcohol-based hand sanitizer with at least 60% alcohol.  . If coughing or sneezing, cover your mouth and nose by coughing or sneezing into the elbow areas of your shirt or coat, into a tissue or into your sleeve (not your hands). Langley Gauss A MASK when in public  . Avoid shaking hands with others and consider head nods or verbal greetings only. . Avoid touching your eyes, nose, or mouth with unwashed hands.  . Avoid close contact with people who are sick. . Avoid places or events with large numbers of people in one location, like concerts or sporting events. . If you have some symptoms but not all symptoms, continue to monitor at home and seek medical attention if your symptoms worsen. . If you are having a medical emergency, call 911.   [place] / e-Visit: [email or web address]         [facility]: [phone]  [facility]: [phone]                   [facility]: [phone]     It is flu season:   >>> Best ways to protect herself from  the flu: Receive the yearly flu vaccine, practice good hand hygiene washing with soap and also using hand sanitizer when available, eat a nutritious meals, get adequate rest, hydrate appropriately   Please contact the office if your symptoms worsen or you have concerns that you are not improving.   Thank you for choosing [facility] for your healthcare, and for allowing [place] to partner with you on your healthcare journey. I am thankful to be able to provide care to you today.   Wyn Quaker FNP-C

## 2019-08-28 NOTE — Assessment & Plan Note (Signed)
Patient reporting chronic cough for greater than 5 years Patient reports he has not coughed in the last 3 months Patient is unsure what may be driving the cough Is typically a dry cough Postnasal drip on exam today  Plan: If cough returns patient to start a daily antihistamine We will need to continue to clinically monitor

## 2019-08-28 NOTE — Assessment & Plan Note (Signed)
Most recent bronchoscopy is does not show any malignancy based off path reports  Plan: We will plan on repeating CT chest in 6 months

## 2019-09-13 ENCOUNTER — Encounter: Payer: Self-pay | Admitting: *Deleted

## 2019-09-13 ENCOUNTER — Ambulatory Visit (INDEPENDENT_AMBULATORY_CARE_PROVIDER_SITE_OTHER): Payer: BC Managed Care – PPO | Admitting: Internal Medicine

## 2019-09-13 ENCOUNTER — Other Ambulatory Visit: Payer: Self-pay

## 2019-09-13 ENCOUNTER — Encounter: Payer: Self-pay | Admitting: Internal Medicine

## 2019-09-13 VITALS — BP 118/80 | HR 61 | Ht 66.0 in | Wt 206.8 lb

## 2019-09-13 DIAGNOSIS — I2510 Atherosclerotic heart disease of native coronary artery without angina pectoris: Secondary | ICD-10-CM | POA: Diagnosis not present

## 2019-09-13 DIAGNOSIS — E785 Hyperlipidemia, unspecified: Secondary | ICD-10-CM
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
## 2019-09-13 NOTE — Patient Instructions (Signed)
Medication Instructions:  No changes *If you need a refill on your cardiac medications before your next appointment, please call your pharmacy*   Lab Work: Lipids today  If you have labs (blood work) drawn today and your tests are completely normal, you will receive your results only by: Marland Kitchen MyChart Message (if you have MyChart) OR . A paper copy in the mail If you have any lab test that is abnormal or we need to change your treatment, we will call you to review the results.   Testing/Procedures: Your physician has requested that you have a lexiscan myoview. For further information please visit [email or web address]. Please follow instruction sheet, as given.    Follow-Up: At [facility], you and your health needs are our priority.  As part of our continuing mission to provide you with exceptional heart care, we have created designated Provider Care Teams.  These Care Teams include your primary Cardiologist (physician) and Advanced Practice Providers (APPs -  Physician Assistants and Nurse Practitioners) who all work together to provide you with the care you need, when you need it.     Your next appointment:   6 month(s)  The format for your next appointment:   In Person  Provider:   You may see [name], MD or one of the following Advanced Practice Providers on your designated Care Team:    Richardson Dopp, PA-C  Robbie Lis, [place]    Other Instructions

## 2019-09-14 LAB — LIPID PANEL
Chol/HDL Ratio: 2.8 ratio (ref 0.0–5.0)
Cholesterol, Total: 121 mg/dL (ref 100–199)
HDL: 44 mg/dL (ref >39)
LDL Chol Calc (NIH): 62 mg/dL (ref 0–99)
Triglycerides: 71 mg/dL (ref 0–149)
VLDL Cholesterol Cal: 15 mg/dL (ref 5–40)

## 2019-09-19 ENCOUNTER — Telehealth (HOSPITAL_COMMUNITY): Payer: Self-pay

## 2019-09-19 NOTE — Telephone Encounter (Signed)
Spoke with the patient, detailed instructions given. Asked to call back with any questions. S.Mishika Flippen EMTP 

## 2019-09-23 ENCOUNTER — Telehealth (HOSPITAL_COMMUNITY): Payer: Self-pay | Admitting: *Deleted

## 2019-09-23 NOTE — Telephone Encounter (Signed)
Patient given detailed instructions per Myocardial Perfusion Study Information Sheet for the test on  09/25/19. Patient notified to arrive 15 minutes early and that it is imperative to arrive on time for appointment to keep from having the test rescheduled.  If you need to cancel or reschedule your appointment, please call the office within 24 hours of your appointment. . Patient verbalized understanding. Manuel Barker

## 2019-09-24 ENCOUNTER — Other Ambulatory Visit: Payer: Self-pay

## 2019-09-24 ENCOUNTER — Ambulatory Visit (HOSPITAL_COMMUNITY): Payer: BC Managed Care – PPO | Attending: Cardiovascular Disease

## 2019-09-24 VITALS — Ht 66.0 in | Wt 206.0 lb

## 2019-09-24 DIAGNOSIS — I2510 Atherosclerotic heart disease of native coronary artery without angina pectoris: Secondary | ICD-10-CM | POA: Diagnosis not present

## 2019-09-24 DIAGNOSIS — R110 Nausea: Secondary | ICD-10-CM | POA: Insufficient documentation

## 2019-09-24 DIAGNOSIS — E785 Hyperlipidemia, unspecified: Secondary | ICD-10-CM | POA: Diagnosis not present

## 2019-09-24 LAB — MYOCARDIAL PERFUSION IMAGING
LV dias vol: 105 mL (ref 62–150)
LV sys vol: 51 mL
Peak HR: 106 {beats}/min
Rest HR: 62 {beats}/min
SDS: 4
SRS: 0
SSS: 4
TID: 1.38

## 2019-09-24 MED ORDER — AMINOPHYLLINE 25 MG/ML IV SOLN
75.0000 mg | Freq: Once | INTRAVENOUS | Status: AC
  Administered 2019-09-24: 75 mg via INTRAVENOUS

## 2019-09-24 MED ORDER — TECHNETIUM TC 99M TETROFOSMIN IV KIT
32.0000 | PACK | Freq: Once | INTRAVENOUS | Status: AC | PRN
  Administered 2019-09-24: 32 via INTRAVENOUS
  Filled 2019-09-24: qty 32

## 2019-09-24 MED ORDER — TECHNETIUM TC 99M TETROFOSMIN IV KIT
9.3000 | PACK | Freq: Once | INTRAVENOUS | Status: AC | PRN
  Administered 2019-09-24: 9.3 via INTRAVENOUS
  Filled 2019-09-24: qty 10

## 2019-09-24 MED ORDER — REGADENOSON 0.4 MG/5ML IV SOLN
0.4000 mg | Freq: Once | INTRAVENOUS | Status: AC
  Administered 2019-09-24: 0.4 mg via INTRAVENOUS

## 2019-09-25 ENCOUNTER — Telehealth: Payer: Self-pay | Admitting: *Deleted

## 2019-09-25 MED ORDER — ISOSORBIDE MONONITRATE ER 30 MG PO TB24: 30.0000 mg | ORAL_TABLET | Freq: Every day | ORAL | 3 refills | Status: DC

## 2019-09-25 NOTE — Telephone Encounter (Signed)
-----   Message from [name], MD sent at 09/24/2019  8:29 PM EDT ----- Stress test shows a very small are of distal ischemia.   I would recomm maximizing medical treatment first    I would add 30 mg Imdur to meds    If tolerates can increase to 60 mg in morning  I would not plan catheterization right now based on scan findings  Plan for f/u in clinic in about 6 wks

## 2019-09-25 NOTE — Telephone Encounter (Signed)
Patient informed of results of stress test and recommendations.   Aware to monitor for improvement in chest tightness w starting Imdur. Follow up has been scheduled.

## 2019-09-28 ENCOUNTER — Other Ambulatory Visit: Payer: Self-pay | Admitting: Internal Medicine

## 2019-10-01 ENCOUNTER — Encounter: Payer: Self-pay | Admitting: Internal Medicine

## 2019-10-01 ENCOUNTER — Ambulatory Visit (INDEPENDENT_AMBULATORY_CARE_PROVIDER_SITE_OTHER): Payer: BC Managed Care – PPO | Admitting: Internal Medicine

## 2019-10-01 ENCOUNTER — Other Ambulatory Visit: Payer: Self-pay

## 2019-10-01 VITALS — BP 110/70 | HR 68 | Temp 98.3°F | Ht 65.5 in | Wt 205.3 lb

## 2019-10-01 DIAGNOSIS — I10 Essential (primary) hypertension: Secondary | ICD-10-CM

## 2019-10-01 DIAGNOSIS — Z9989 Dependence on other enabling machines and devices: Secondary | ICD-10-CM

## 2019-10-01 DIAGNOSIS — G4733 Obstructive sleep apnea (adult) (pediatric): Secondary | ICD-10-CM | POA: Diagnosis not present

## 2019-10-01 DIAGNOSIS — H5462 Unqualified visual loss, left eye, normal vision right eye: Secondary | ICD-10-CM

## 2019-10-01 DIAGNOSIS — Z0000 Encounter for general adult medical examination without abnormal findings: Secondary | ICD-10-CM

## 2019-10-01 DIAGNOSIS — Z23 Encounter for immunization: Secondary | ICD-10-CM | POA: Diagnosis not present

## 2019-10-01 DIAGNOSIS — C61 Malignant neoplasm of prostate: Secondary | ICD-10-CM | POA: Diagnosis not present

## 2019-10-01 DIAGNOSIS — Z0001 Encounter for general adult medical examination with abnormal findings: Secondary | ICD-10-CM

## 2019-10-01 DIAGNOSIS — H6123 Impacted cerumen, bilateral: Secondary | ICD-10-CM | POA: Diagnosis not present

## 2019-10-01 DIAGNOSIS — R911 Solitary pulmonary nodule: Secondary | ICD-10-CM | POA: Diagnosis not present

## 2019-10-01 DIAGNOSIS — E785 Hyperlipidemia, unspecified: Secondary | ICD-10-CM

## 2019-10-01 DIAGNOSIS — Z1211 Encounter for screening for malignant neoplasm of colon: Secondary | ICD-10-CM

## 2019-10-01 DIAGNOSIS — I25110 Atherosclerotic heart disease of native coronary artery with unstable angina pectoris: Secondary | ICD-10-CM

## 2019-10-01 NOTE — Addendum Note (Signed)
Addended by: Westley Hummer B on: 10/01/2019 04:09 PM   Modules accepted: Orders
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
## 2019-10-01 NOTE — Patient Instructions (Signed)
-  Nice seeing you today!!  -Lab work today; will notify you once results are available.  -Flu and first shingles vaccines today.  -Schedule follow up in 6 months.

## 2019-10-02 ENCOUNTER — Other Ambulatory Visit: Payer: Self-pay | Admitting: Internal Medicine

## 2019-10-02 ENCOUNTER — Encounter: Payer: Self-pay | Admitting: Internal Medicine

## 2019-10-02 DIAGNOSIS — E559 Vitamin D deficiency, unspecified: Secondary | ICD-10-CM

## 2019-10-02 DIAGNOSIS — R7302 Impaired glucose tolerance (oral): Secondary | ICD-10-CM | POA: Insufficient documentation

## 2019-10-02 LAB — VITAMIN D 25 HYDROXY (VIT D DEFICIENCY, FRACTURES): Vit D, 25-Hydroxy: 12 ng/mL — ABNORMAL LOW (ref 30–100)

## 2019-10-02 LAB — COMPREHENSIVE METABOLIC PANEL
AG Ratio: 1.5 (calc) (ref 1.0–2.5)
ALT: 26 U/L (ref 9–46)
AST: 19 U/L (ref 10–35)
Albumin: 4 g/dL (ref 3.6–5.1)
Alkaline phosphatase (APISO): 62 U/L (ref 35–144)
BUN: 9 mg/dL (ref 7–25)
CO2: 27 mmol/L (ref 20–32)
Calcium: 9 mg/dL (ref 8.6–10.3)
Chloride: 106 mmol/L (ref 98–110)
Creat: 0.96 mg/dL (ref 0.70–1.25)
Globulin: 2.6 g/dL (calc) (ref 1.9–3.7)
Glucose, Bld: 86 mg/dL (ref 65–99)
Potassium: 3.9 mmol/L (ref 3.5–5.3)
Sodium: 138 mmol/L (ref 135–146)
Total Bilirubin: 0.5 mg/dL (ref 0.2–1.2)
Total Protein: 6.6 g/dL (ref 6.1–8.1)

## 2019-10-02 LAB — HEMOGLOBIN A1C
Hgb A1c MFr Bld: 6.2 % of total Hgb — ABNORMAL HIGH (ref <5.7)
Mean Plasma Glucose: 131 (calc)
eAG (mmol/L): 7.3 (calc)

## 2019-10-02 LAB — CBC WITH DIFFERENTIAL/PLATELET
Absolute Monocytes: 570 cells/uL (ref 200–950)
Basophils Absolute: 37 cells/uL (ref 0–200)
Basophils Relative: 0.6 %
Eosinophils Absolute: 112 cells/uL (ref 15–500)
Eosinophils Relative: 1.8 %
HCT: 44.6 % (ref 38.5–50.0)
Hemoglobin: 14.8 g/dL (ref 13.2–17.1)
Lymphs Abs: 1221 cells/uL (ref 850–3900)
MCH: 28.8 pg (ref 27.0–33.0)
MCHC: 33.2 g/dL (ref 32.0–36.0)
MCV: 86.9 fL (ref 80.0–100.0)
MPV: 11.2 fL (ref 7.5–12.5)
Monocytes Relative: 9.2 %
Neutro Abs: 4259 cells/uL (ref 1500–7800)
Neutrophils Relative %: 68.7 %
Platelets: 196 10*3/uL (ref 140–400)
RBC: 5.13 10*6/uL (ref 4.20–5.80)
RDW: 14.2 % (ref 11.0–15.0)
Total Lymphocyte: 19.7 %
WBC: 6.2 10*3/uL (ref 3.8–10.8)

## 2019-10-02 LAB — VITAMIN B12: Vitamin B-12: 347 pg/mL (ref 200–1100)

## 2019-10-02 LAB — TSH: TSH: 0.88 mIU/L (ref 0.40–4.50)

## 2019-10-02 MED ORDER — VITAMIN D (ERGOCALCIFEROL) 1.25 MG (50000 UNIT) PO CAPS: 50000.0000 [IU] | ORAL_CAPSULE | ORAL | 0 refills | Status: AC

## 2019-10-03 ENCOUNTER — Other Ambulatory Visit: Payer: Self-pay | Admitting: Internal Medicine

## 2019-10-03 DIAGNOSIS — E559 Vitamin D deficiency, unspecified: Secondary | ICD-10-CM

## 2019-10-20 ENCOUNTER — Other Ambulatory Visit: Payer: Self-pay | Admitting: Internal Medicine

## 2019-11-12 ENCOUNTER — Ambulatory Visit: Payer: BC Managed Care – PPO | Attending: Internal Medicine

## 2019-11-12 DIAGNOSIS — Z23 Encounter for immunization: Secondary | ICD-10-CM

## 2019-11-12 NOTE — Progress Notes (Signed)
   Covid-19 Vaccination Clinic  Name:  Manuel Barker    MRN: [id number] DOB: [birth year]  11/12/2019  [name] was observed post Covid-19 immunization for 30 minutes based on pre-vaccination screening without incident. He was provided with Vaccine Information Sheet and instruction to access the V-Safe system.   [name] was instructed to call 911 with any severe reactions post vaccine: Marland Kitchen Difficulty breathing  . Swelling of face and throat  . A fast heartbeat  . A bad rash all over body  . Dizziness and weakness

## 2019-11-21 ENCOUNTER — Ambulatory Visit (INDEPENDENT_AMBULATORY_CARE_PROVIDER_SITE_OTHER): Payer: BC Managed Care – PPO | Admitting: Physician Assistant

## 2019-11-21 ENCOUNTER — Telehealth: Payer: Self-pay

## 2019-11-21 ENCOUNTER — Encounter: Payer: Self-pay | Admitting: Physician Assistant

## 2019-11-21 VITALS — BP 120/70 | HR 77 | Ht 65.5 in | Wt 207.8 lb

## 2019-11-21 DIAGNOSIS — Z7901 Long term (current) use of anticoagulants: Secondary | ICD-10-CM

## 2019-11-21 DIAGNOSIS — Z86010 Personal history of colonic polyps: Secondary | ICD-10-CM

## 2019-11-21 MED ORDER — PLENVU 140 G PO SOLR: ORAL | 0 refills | Status: DC

## 2019-11-21 NOTE — Patient Instructions (Signed)
If you are age 69 or older, your body mass index should be between 23-30. Your Body mass index is 34.05 kg/m. If this is out of the aforementioned range listed, please consider follow up with your Primary Care Provider.  If you are age 10 or younger, your body mass index should be between 19-25. Your Body mass index is 34.05 kg/m. If this is out of the aformentioned range listed, please consider follow up with your Primary Care Provider.   You have been scheduled for a colonoscopy. Please follow written instructions given to you at your visit today.  Please pick up your prep supplies at the pharmacy within the next 1-3 days. If you use inhalers (even only as needed), please bring them with you on the day of your procedure.   Thank you for choosing me and [facility].  Ellouise Newer , PA-C
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
## 2019-11-21 NOTE — Telephone Encounter (Signed)
   Manuel Barker 06-07-1950 [id number]  Dear Michel Bickers:  We have scheduled the above named patient for a(n) Colnoscopy procedure. Our records show that (s)he is on anticoagulation therapy.  Please advise as to whether the patient may come off their therapy of Plavix  5  days prior to their procedure which is scheduled for 12/04/19.  Please route your response to [facility] or fax response to [phone].  Sincerely,    [facility]

## 2019-11-21 NOTE — Telephone Encounter (Signed)
Called patient and informed him to hold plavix 5 days prior to procedure. Patient confirmed he would hold plavix 5 days before.

## 2019-11-21 NOTE — Telephone Encounter (Signed)
OK to come off of Plavix 5 days before procedure    I would resume when safe per GI after since he had instent restenosis
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
## 2019-11-22 ENCOUNTER — Encounter: Payer: Self-pay | Admitting: Internal Medicine

## 2019-11-22 ENCOUNTER — Other Ambulatory Visit: Payer: Self-pay

## 2019-11-22 ENCOUNTER — Ambulatory Visit (INDEPENDENT_AMBULATORY_CARE_PROVIDER_SITE_OTHER): Payer: BC Managed Care – PPO | Admitting: Internal Medicine

## 2019-11-22 VITALS — BP 116/72 | HR 75 | Ht 66.0 in | Wt 208.8 lb

## 2019-11-22 DIAGNOSIS — I2510 Atherosclerotic heart disease of native coronary artery without angina pectoris: Secondary | ICD-10-CM

## 2019-11-22 DIAGNOSIS — E782 Mixed hyperlipidemia: Secondary | ICD-10-CM

## 2019-11-22 MED ORDER — ROSUVASTATIN CALCIUM 40 MG PO TABS
40.0000 mg | ORAL_TABLET | Freq: Every day | ORAL | 3 refills | Status: DC
Start: 2019-11-22 — End: 2020-12-16

## 2019-11-22 NOTE — Patient Instructions (Signed)
Medication Instructions:  No changes *If you need a refill on your cardiac medications before your next appointment, please call your pharmacy*   Lab Work: none If you have labs (blood work) drawn today and your tests are completely normal, you will receive your results only by: . MyChart Message (if you have MyChart) OR . A paper copy in the mail If you have any lab test that is abnormal or we need to change your treatment, we will call you to review the results.   Testing/Procedures: none   Follow-Up: As planned  Other Instructions   

## 2019-11-22 NOTE — Progress Notes (Signed)
Reviewed and agree with management plan.  Ceylon Arenson T. Nahuel Wilbert, MD FACG [facility]  

## 2019-11-27 DIAGNOSIS — C61 Malignant neoplasm of prostate: Secondary | ICD-10-CM | POA: Diagnosis not present

## 2019-12-04 ENCOUNTER — Encounter: Payer: Self-pay | Admitting: Gastroenterology

## 2019-12-04 ENCOUNTER — Ambulatory Visit (AMBULATORY_SURGERY_CENTER): Payer: BC Managed Care – PPO | Admitting: Gastroenterology

## 2019-12-04 ENCOUNTER — Other Ambulatory Visit: Payer: Self-pay

## 2019-12-04 VITALS — BP 136/66 | HR 59 | Temp 97.5°F | Resp 17 | Ht 66.0 in | Wt 208.0 lb

## 2019-12-04 DIAGNOSIS — K635 Polyp of colon: Secondary | ICD-10-CM

## 2019-12-04 DIAGNOSIS — Z86010 Personal history of colonic polyps: Secondary | ICD-10-CM

## 2019-12-04 DIAGNOSIS — D122 Benign neoplasm of ascending colon: Secondary | ICD-10-CM | POA: Diagnosis not present

## 2019-12-04 DIAGNOSIS — D123 Benign neoplasm of transverse colon: Secondary | ICD-10-CM

## 2019-12-04 DIAGNOSIS — K627 Radiation proctitis: Secondary | ICD-10-CM

## 2019-12-04 DIAGNOSIS — D124 Benign neoplasm of descending colon: Secondary | ICD-10-CM

## 2019-12-04 MED ORDER — SODIUM CHLORIDE 0.9 % IV SOLN: 500.0000 mL | Freq: Once | INTRAVENOUS | Status: DC

## 2019-12-04 NOTE — Progress Notes (Signed)
PT taken to PACU. Monitors in place. VSS. Report given to RN. 

## 2019-12-04 NOTE — Patient Instructions (Signed)
YOU HAD AN ENDOSCOPIC PROCEDURE TODAY AT THE [facility]:   Refer to the procedure report that was given to you for any specific questions about what was found during the examination.  If the procedure report does not answer your questions, please call your gastroenterologist to clarify.  If you requested that your care partner not be given the details of your procedure findings, then the procedure report has been included in a sealed envelope for you to review at your convenience later.  YOU SHOULD EXPECT: Some feelings of bloating in the abdomen. Passage of more gas than usual.  Walking can help get rid of the air that was put into your GI tract during the procedure and reduce the bloating. If you had a lower endoscopy (such as a colonoscopy or flexible sigmoidoscopy) you may notice spotting of blood in your stool or on the toilet paper. If you underwent a bowel prep for your procedure, you may not have a normal bowel movement for a few days.  Please Note:  You might notice some irritation and congestion in your nose or some drainage.  This is from the oxygen used during your procedure.  There is no need for concern and it should clear up in a day or so.  SYMPTOMS TO REPORT IMMEDIATELY:   Following lower endoscopy (colonoscopy or flexible sigmoidoscopy):  Excessive amounts of blood in the stool  Significant tenderness or worsening of abdominal pains  Swelling of the abdomen that is new, acute  Fever of 100F or higher  For urgent or emergent issues, a gastroenterologist can be reached at any hour by calling [phone]. Do not use MyChart messaging for urgent concerns.    DIET:  We do recommend a small meal at first, but then you may proceed to your regular diet.  Drink plenty of fluids but you should avoid alcoholic beverages for 24 hours.  ACTIVITY:  You should plan to take it easy for the rest of today and you should NOT DRIVE or use heavy machinery until tomorrow (because  of the sedation medicines used during the test).    FOLLOW UP: Our staff will call the number listed on your records 48-72 hours following your procedure to check on you and address any questions or concerns that you may have regarding the information given to you following your procedure. If we do not reach you, we will leave a message.  We will attempt to reach you two times.  During this call, we will ask if you have developed any symptoms of COVID 19. If you develop any symptoms (ie: fever, flu-like symptoms, shortness of breath, cough etc.) before then, please call [phone].  If you test positive for Covid 19 in the 2 weeks post procedure, please call and report this information to us.    If any biopsies were taken you will be contacted by phone or by letter within the next 1-3 weeks.  Please call us at [phone] if you have not heard about the biopsies in 3 weeks.    SIGNATURES/CONFIDENTIALITY: You and/or your care partner have signed paperwork which will be entered into your electronic medical record.  These signatures attest to the fact that that the information above on your After Visit Summary has been reviewed and is understood.  Full responsibility of the confidentiality of this discharge information lies with you and/or your care-partner. 

## 2019-12-04 NOTE — Op Note (Signed)
[facility] Patient Name: Manuel Barker Procedure Date: 12/04/2019 9:56 AM MRN: [id number] Endoscopist: Ladene Artist , MD Age: 69 Referring MD:  Date of Birth: [birth year] Gender: Male Account #: [id number] Procedure:                Colonoscopy Indications:              High risk colon cancer surveillance: Personal                            history of sessile serrated colon polyp (less than                            10 mm in size) with no dysplasia Medicines:                Monitored Anesthesia Care Procedure:                Pre-Anesthesia Assessment:                           - Prior to the procedure, a History and Physical                            was performed, and patient medications and                            allergies were reviewed. The patient's tolerance of                            previous anesthesia was also reviewed. The risks                            and benefits of the procedure and the sedation                            options and risks were discussed with the patient.                            All questions were answered, and informed consent                            was obtained. Prior Anticoagulants: The patient has                            taken Plavix (clopidogrel), last dose was 5 days                            prior to procedure. ASA Grade Assessment: III - A                            patient with severe systemic disease. After                            reviewing the risks and benefits, the patient was  deemed in satisfactory condition to undergo the                            procedure.                           After obtaining informed consent, the colonoscope                            was passed under direct vision. Throughout the                            procedure, the patient's blood pressure, pulse, and                            oxygen saturations were monitored continuously. The                             Colonoscope was introduced through the anus and                            advanced to the the cecum, identified by                            appendiceal orifice and ileocecal valve. The                            ileocecal valve, appendiceal orifice, and rectum                            were photographed. The quality of the bowel                            preparation was good. The colonoscopy was performed                            without difficulty. The patient tolerated the                            procedure well. Scope In: 10:02:29 AM Scope Out: 10:24:52 AM Scope Withdrawal Time: 0 hours 19 minutes 20 seconds  Total Procedure Duration: 0 hours 22 minutes 23 seconds  Findings:                 The perianal and digital rectal examinations were                            normal.                           A 12 mm polyp was found in the ascending colon. The                            polyp was pedunculated. The polyp was removed with  a hot snare. Resection and retrieval were complete.                            Plan to resume Plavix today. To prevent bleeding                            after the polypectomy, one hemostatic clip was                            successfully placed (MR conditional). There was no                            bleeding during, or at the end, of the procedure.                           Three sessile polyps were found in the descending                            colon (1) and transverse colon (2). The polyps were                            6 to 7 mm in size. These polyps were removed with a                            cold snare. Resection and retrieval were complete.                           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                           Internal hemorrhoids were  found during                            retroflexion. The hemorrhoids were small and Grade                            I (internal hemorrhoids that do not prolapse).                           The exam was otherwise without abnormality on                            direct and retroflexion views. Complications:            No immediate complications. Estimated blood loss:                            None. Estimated Blood Loss:  Estimated blood loss: none. Impression:               - One 12 mm polyp in the ascending colon, removed                            with a hot snare. Resected and retrieved. Clip (MR                            conditional) was placed.                           - Three 6 to 7 mm polyps in the descending colon                            and in the transverse colon, removed with a cold                            snare. Resected and retrieved.                           - A few non-bleeding colonic angiodysplastic                            lesions.                           - Internal hemorrhoids.                           - The examination was otherwise normal on direct                            and retroflexion views. Recommendation:           - Repeat colonoscopy, likely 3 years, after studies                            are complete for surveillance based on pathology                            results.                           - Resume Plavix (clopidogrel) today at prior dose.                            Refer to managing physician for further adjustment                            of therapy.                           - Patient has a contact number available for                            emergencies. The signs and symptoms of potential  delayed complications were discussed with the                            patient. Return to normal activities tomorrow.                            Written discharge instructions were provided to the                             patient.                           - Resume previous diet.                           - Continue present medications.                           - Await pathology results. Ladene Artist, MD 12/04/2019 10:30:52 AM This report has been signed electronically.

## 2019-12-04 NOTE — Progress Notes (Signed)
Called to room to assist during endoscopic procedure.  Patient ID and intended procedure confirmed with present staff. Received instructions for my participation in the procedure from the performing physician.  

## 2019-12-06 ENCOUNTER — Telehealth: Payer: Self-pay

## 2019-12-06 NOTE — Telephone Encounter (Signed)
  Follow up Call-  Call back number 12/04/2019  Post procedure Call Back phone  # [phone]  Permission to leave phone message Yes  Some recent data might be hidden     Patient questions:  Do you have a fever, pain , or abdominal swelling? No. Pain Score  0 *  Have you tolerated food without any problems? Yes.    Have you been able to return to your normal activities? Yes.    Do you have any questions about your discharge instructions: Diet   No. Medications  No. Follow up visit  No.  Do you have questions or concerns about your Care? No.  Actions: * If pain score is 4 or above: No action needed, pain <4.  1. Have you developed a fever since your procedure? no  2.   Have you had an respiratory symptoms (SOB or cough) since your procedure? no  3.   Have you tested positive for COVID 19 since your procedure no  4.   Have you had any family members/close contacts diagnosed with the COVID 19 since your procedure?  no   If yes to any of these questions please route to Joylene John, RN and Joella Prince, RN

## 2019-12-09 ENCOUNTER — Observation Stay (HOSPITAL_COMMUNITY)
Admission: EM | Admit: 2019-12-09 | Discharge: 2019-12-11 | Disposition: A | Discharge status: Home / Self Care | Payer: BC Managed Care – PPO | Attending: Internal Medicine | Admitting: Internal Medicine

## 2019-12-09 ENCOUNTER — Encounter (HOSPITAL_COMMUNITY): Payer: Self-pay

## 2019-12-09 ENCOUNTER — Other Ambulatory Visit: Payer: Self-pay

## 2019-12-09 DIAGNOSIS — D123 Benign neoplasm of transverse colon: Secondary | ICD-10-CM

## 2019-12-09 DIAGNOSIS — Z79899 Other long term (current) drug therapy: Secondary | ICD-10-CM | POA: Insufficient documentation

## 2019-12-09 DIAGNOSIS — K635 Polyp of colon: Secondary | ICD-10-CM | POA: Diagnosis not present

## 2019-12-09 DIAGNOSIS — C61 Malignant neoplasm of prostate: Secondary | ICD-10-CM | POA: Diagnosis present

## 2019-12-09 DIAGNOSIS — Z9989 Dependence on other enabling machines and devices: Secondary | ICD-10-CM

## 2019-12-09 DIAGNOSIS — I25110 Atherosclerotic heart disease of native coronary artery with unstable angina pectoris: Secondary | ICD-10-CM | POA: Diagnosis present

## 2019-12-09 DIAGNOSIS — K633 Ulcer of intestine: Secondary | ICD-10-CM

## 2019-12-09 DIAGNOSIS — E785 Hyperlipidemia, unspecified: Secondary | ICD-10-CM | POA: Diagnosis present

## 2019-12-09 DIAGNOSIS — K640 First degree hemorrhoids: Secondary | ICD-10-CM | POA: Insufficient documentation

## 2019-12-09 DIAGNOSIS — I2510 Atherosclerotic heart disease of native coronary artery without angina pectoris: Secondary | ICD-10-CM | POA: Insufficient documentation

## 2019-12-09 DIAGNOSIS — Z7982 Long term (current) use of aspirin: Secondary | ICD-10-CM | POA: Insufficient documentation

## 2019-12-09 DIAGNOSIS — K922 Gastrointestinal hemorrhage, unspecified: Secondary | ICD-10-CM

## 2019-12-09 DIAGNOSIS — K625 Hemorrhage of anus and rectum: Secondary | ICD-10-CM | POA: Diagnosis not present

## 2019-12-09 DIAGNOSIS — K5520 Angiodysplasia of colon without hemorrhage: Secondary | ICD-10-CM | POA: Diagnosis not present

## 2019-12-09 DIAGNOSIS — Z20822 Contact with and (suspected) exposure to covid-19: Secondary | ICD-10-CM | POA: Diagnosis not present

## 2019-12-09 DIAGNOSIS — Z9889 Other specified postprocedural states: Secondary | ICD-10-CM

## 2019-12-09 DIAGNOSIS — G4733 Obstructive sleep apnea (adult) (pediatric): Secondary | ICD-10-CM

## 2019-12-09 DIAGNOSIS — I10 Essential (primary) hypertension: Secondary | ICD-10-CM | POA: Diagnosis present

## 2019-12-09 DIAGNOSIS — K921 Melena: Secondary | ICD-10-CM | POA: Diagnosis present

## 2019-12-09 DIAGNOSIS — I25119 Atherosclerotic heart disease of native coronary artery with unspecified angina pectoris: Secondary | ICD-10-CM | POA: Diagnosis present

## 2019-12-09 LAB — POC OCCULT BLOOD, ED: Fecal Occult Bld: POSITIVE — AB

## 2019-12-09 LAB — COMPREHENSIVE METABOLIC PANEL
ALT: 28 U/L (ref 0–44)
AST: 21 U/L (ref 15–41)
Albumin: 3.8 g/dL (ref 3.5–5.0)
Alkaline Phosphatase: 58 U/L (ref 38–126)
Anion gap: 8 (ref 5–15)
BUN: 11 mg/dL (ref 8–23)
CO2: 22 mmol/L (ref 22–32)
Calcium: 8.6 mg/dL — ABNORMAL LOW (ref 8.9–10.3)
Chloride: 107 mmol/L (ref 98–111)
Creatinine, Ser: 0.97 mg/dL (ref 0.61–1.24)
GFR, Estimated: >60 mL/min (ref >60)
Glucose, Bld: 112 mg/dL — ABNORMAL HIGH (ref 70–99)
Potassium: 3.8 mmol/L (ref 3.5–5.1)
Sodium: 137 mmol/L (ref 135–145)
Total Bilirubin: 0.6 mg/dL (ref 0.3–1.2)
Total Protein: 6.7 g/dL (ref 6.5–8.1)

## 2019-12-09 LAB — RESP PANEL BY RT-PCR (FLU A&B, COVID) ARPGX2
Influenza A by PCR: NEGATIVE
Influenza B by PCR: NEGATIVE
SARS Coronavirus 2 by RT PCR: NEGATIVE

## 2019-12-09 LAB — CBC
HCT: 41.1 % (ref 39.0–52.0)
Hemoglobin: 12.9 g/dL — ABNORMAL LOW (ref 13.0–17.0)
MCH: 28.9 pg (ref 26.0–34.0)
MCHC: 31.4 g/dL (ref 30.0–36.0)
MCV: 91.9 fL (ref 80.0–100.0)
Platelets: 172 10*3/uL (ref 150–400)
RBC: 4.47 MIL/uL (ref 4.22–5.81)
RDW: 14.4 % (ref 11.5–15.5)
WBC: 5.6 10*3/uL (ref 4.0–10.5)
nRBC: 0 % (ref 0.0–0.2)

## 2019-12-09 LAB — TYPE AND SCREEN
ABO/RH(D): B POS
Antibody Screen: NEGATIVE

## 2019-12-09 NOTE — Telephone Encounter (Signed)
Patient's wife called stating that patient is at the [facility] ED but patient has not been seen yet.  I relate message to her that patient needs further evaluation there, however she insisted on sending a message for someone in the office to call her back.

## 2019-12-09 NOTE — ED Triage Notes (Signed)
Patient reports that he had a colonoscopy 5 days ago in  which he had polyps removed and a prostate exam 3 days ago. Patient states 2 days ago he noticed dark and red blood and some bright red blood in his stools and also today.
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
## 2019-12-09 NOTE — Telephone Encounter (Signed)
Colonoscopy on 12/1 with 1 hot ascending colon polypectomy (with 1 clip placed to help prevent bleeding) and 3 cold polypectomies in transverse and descending colon. Black stool filling commode is concerning. He is taking Plavix and ASA. Small amounts of red rectal bleeding could be from radiation proctitis or hemorrhoids. ED evaluation now to further evaluate for a significant GI bleed.

## 2019-12-09 NOTE — Telephone Encounter (Signed)
Pt is requesting a call back from a nurse to discuss the colonoscopy he had with Dr Fuller Plan 12/1, pt states he was doing well until he had a prostate exam with his Urologist on Friday 12/3, pt is experiencing bleeding and blood in the stools. going on for about 2 days now.
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
## 2019-12-09 NOTE — Telephone Encounter (Signed)
Patient advised of new recommendations.  He verbalized understanding and will proceed to the ED

## 2019-12-09 NOTE — Telephone Encounter (Signed)
I spoke with the patient's wife and all of her questions were answered.  She is advised that if patient has more bleeding, is light headed of dizzy they need to be informing the triage nurse . She verbalized understanding.
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
## 2019-12-09 NOTE — Telephone Encounter (Signed)
Patient reports that he is s/p colonoscopy on 12/1. He had one 12 mm polyp removed by cold snare, 3 6-7 mm polyps  removed by cold snare.  He had some possible radiation proctitis as well as Grade one Hemorrhoids.  Rectal bleeding started on Saturday.  He had a prostate exam on Friday.  He is on Plavix.  Blood is bright red with a BM only, but he reports the stool is "black" and the blood fills the commode.  PLease advise

## 2019-12-10 ENCOUNTER — Encounter (HOSPITAL_COMMUNITY)
Admission: EM | Disposition: A | Discharge status: Home / Self Care | Payer: Self-pay | Source: Home / Self Care | Attending: Emergency Medicine

## 2019-12-10 ENCOUNTER — Encounter (HOSPITAL_COMMUNITY): Payer: Self-pay | Admitting: Internal Medicine

## 2019-12-10 ENCOUNTER — Observation Stay (HOSPITAL_COMMUNITY): Payer: BC Managed Care – PPO | Admitting: Registered Nurse

## 2019-12-10 DIAGNOSIS — K625 Hemorrhage of anus and rectum: Secondary | ICD-10-CM | POA: Diagnosis not present

## 2019-12-10 DIAGNOSIS — Z86010 Personal history of colonic polyps: Secondary | ICD-10-CM | POA: Diagnosis not present

## 2019-12-10 DIAGNOSIS — K633 Ulcer of intestine: Secondary | ICD-10-CM

## 2019-12-10 DIAGNOSIS — D123 Benign neoplasm of transverse colon: Secondary | ICD-10-CM | POA: Diagnosis not present

## 2019-12-10 DIAGNOSIS — K640 First degree hemorrhoids: Secondary | ICD-10-CM | POA: Diagnosis not present

## 2019-12-10 DIAGNOSIS — Z7902 Long term (current) use of antithrombotics/antiplatelets: Secondary | ICD-10-CM | POA: Diagnosis not present

## 2019-12-10 DIAGNOSIS — K921 Melena: Secondary | ICD-10-CM | POA: Diagnosis not present

## 2019-12-10 DIAGNOSIS — K5520 Angiodysplasia of colon without hemorrhage: Secondary | ICD-10-CM

## 2019-12-10 DIAGNOSIS — K635 Polyp of colon: Secondary | ICD-10-CM

## 2019-12-10 DIAGNOSIS — Z9889 Other specified postprocedural states: Secondary | ICD-10-CM

## 2019-12-10 HISTORY — PX: COLONOSCOPY WITH PROPOFOL: SHX5780

## 2019-12-10 HISTORY — PX: POLYPECTOMY: SHX5525

## 2019-12-10 HISTORY — PX: HEMOSTASIS CLIP PLACEMENT: SHX6857

## 2019-12-10 LAB — COMPREHENSIVE METABOLIC PANEL
ALT: 16 U/L (ref 0–44)
AST: 13 U/L — ABNORMAL LOW (ref 15–41)
Albumin: 2.1 g/dL — ABNORMAL LOW (ref 3.5–5.0)
Alkaline Phosphatase: 32 U/L — ABNORMAL LOW (ref 38–126)
Anion gap: 5 (ref 5–15)
BUN: 8 mg/dL (ref 8–23)
CO2: 16 mmol/L — ABNORMAL LOW (ref 22–32)
Calcium: 5 mg/dL — CL (ref 8.9–10.3)
Chloride: 123 mmol/L — ABNORMAL HIGH (ref 98–111)
Creatinine, Ser: 0.48 mg/dL — ABNORMAL LOW (ref 0.61–1.24)
GFR, Estimated: >60 mL/min (ref >60)
Glucose, Bld: 62 mg/dL — ABNORMAL LOW (ref 70–99)
Potassium: 2.3 mmol/L — CL (ref 3.5–5.1)
Sodium: 144 mmol/L (ref 135–145)
Total Bilirubin: 0.6 mg/dL (ref 0.3–1.2)
Total Protein: 3.6 g/dL — ABNORMAL LOW (ref 6.5–8.1)

## 2019-12-10 LAB — CBC
HCT: 28.5 % — ABNORMAL LOW (ref 39.0–52.0)
HCT: 33.3 % — ABNORMAL LOW (ref 39.0–52.0)
HCT: 33.8 % — ABNORMAL LOW (ref 39.0–52.0)
HCT: 37 % — ABNORMAL LOW (ref 39.0–52.0)
Hemoglobin: 10.7 g/dL — ABNORMAL LOW (ref 13.0–17.0)
Hemoglobin: 10.9 g/dL — ABNORMAL LOW (ref 13.0–17.0)
Hemoglobin: 12 g/dL — ABNORMAL LOW (ref 13.0–17.0)
Hemoglobin: 9 g/dL — ABNORMAL LOW (ref 13.0–17.0)
MCH: 28.8 pg (ref 26.0–34.0)
MCH: 29 pg (ref 26.0–34.0)
MCH: 29.1 pg (ref 26.0–34.0)
MCH: 29.3 pg (ref 26.0–34.0)
MCHC: 31.6 g/dL (ref 30.0–36.0)
MCHC: 32.1 g/dL (ref 30.0–36.0)
MCHC: 32.2 g/dL (ref 30.0–36.0)
MCHC: 32.4 g/dL (ref 30.0–36.0)
MCV: 89.8 fL (ref 80.0–100.0)
MCV: 90.1 fL (ref 80.0–100.0)
MCV: 90.2 fL (ref 80.0–100.0)
MCV: 91.9 fL (ref 80.0–100.0)
Platelets: 138 10*3/uL — ABNORMAL LOW (ref 150–400)
Platelets: 161 10*3/uL (ref 150–400)
Platelets: 162 10*3/uL (ref 150–400)
Platelets: 190 10*3/uL (ref 150–400)
RBC: 3.1 MIL/uL — ABNORMAL LOW (ref 4.22–5.81)
RBC: 3.71 MIL/uL — ABNORMAL LOW (ref 4.22–5.81)
RBC: 3.75 MIL/uL — ABNORMAL LOW (ref 4.22–5.81)
RBC: 4.1 MIL/uL — ABNORMAL LOW (ref 4.22–5.81)
RDW: 14.3 % (ref 11.5–15.5)
RDW: 14.4 % (ref 11.5–15.5)
RDW: 14.4 % (ref 11.5–15.5)
RDW: 14.5 % (ref 11.5–15.5)
WBC: 5.3 10*3/uL (ref 4.0–10.5)
WBC: 5.6 10*3/uL (ref 4.0–10.5)
WBC: 5.6 10*3/uL (ref 4.0–10.5)
WBC: 6.1 10*3/uL (ref 4.0–10.5)
nRBC: 0 % (ref 0.0–0.2)
nRBC: 0 % (ref 0.0–0.2)
nRBC: 0 % (ref 0.0–0.2)
nRBC: 0 % (ref 0.0–0.2)

## 2019-12-10 LAB — BASIC METABOLIC PANEL
Anion gap: 6 (ref 5–15)
BUN: 11 mg/dL (ref 8–23)
CO2: 24 mmol/L (ref 22–32)
Calcium: 7.9 mg/dL — ABNORMAL LOW (ref 8.9–10.3)
Chloride: 110 mmol/L (ref 98–111)
Creatinine, Ser: 0.76 mg/dL (ref 0.61–1.24)
GFR, Estimated: >60 mL/min (ref >60)
Glucose, Bld: 89 mg/dL (ref 70–99)
Potassium: 3.9 mmol/L (ref 3.5–5.1)
Sodium: 140 mmol/L (ref 135–145)

## 2019-12-10 LAB — ABO/RH: ABO/RH(D): B POS

## 2019-12-10 LAB — HIV ANTIBODY (ROUTINE TESTING W REFLEX): HIV Screen 4th Generation wRfx: NONREACTIVE

## 2019-12-10 LAB — TROPONIN I (HIGH SENSITIVITY)
Troponin I (High Sensitivity): 14 ng/L (ref <18)
Troponin I (High Sensitivity): 12 ng/L (ref <18)

## 2019-12-10 SURGERY — COLONOSCOPY WITH PROPOFOL
Anesthesia: Monitor Anesthesia Care

## 2019-12-10 MED ORDER — KCL IN DEXTROSE-NACL 40-5-0.9 MEQ/L-%-% IV SOLN
INTRAVENOUS | Status: DC
  Filled 2019-12-10: qty 1000

## 2019-12-10 MED ORDER — PROPOFOL 500 MG/50ML IV EMUL
INTRAVENOUS | Status: DC | PRN
  Administered 2019-12-10: 125 ug/kg/min via INTRAVENOUS

## 2019-12-10 MED ORDER — PANTOPRAZOLE SODIUM 40 MG IV SOLR
40.0000 mg | Freq: Two times a day (BID) | INTRAVENOUS | Status: DC
  Administered 2019-12-10 – 2019-12-11 (×4): 40 mg via INTRAVENOUS
  Filled 2019-12-10 (×4): qty 40

## 2019-12-10 MED ORDER — LIDOCAINE 2% (20 MG/ML) 5 ML SYRINGE
INTRAMUSCULAR | Status: DC | PRN
  Administered 2019-12-10: 60 mg via INTRAVENOUS

## 2019-12-10 MED ORDER — ASPIRIN EC 81 MG PO TBEC
81.0000 mg | DELAYED_RELEASE_TABLET | Freq: Every day | ORAL | Status: DC
  Filled 2019-12-10: qty 1

## 2019-12-10 MED ORDER — PHENYLEPHRINE 40 MCG/ML (10ML) SYRINGE FOR IV PUSH (FOR BLOOD PRESSURE SUPPORT)
PREFILLED_SYRINGE | INTRAVENOUS | Status: DC | PRN
  Administered 2019-12-10 (×10): 80 ug via INTRAVENOUS

## 2019-12-10 MED ORDER — SODIUM CHLORIDE 0.9 % IV SOLN: INTRAVENOUS | Status: DC

## 2019-12-10 MED ORDER — METOPROLOL SUCCINATE ER 25 MG PO TB24
25.0000 mg | ORAL_TABLET | Freq: Every day | ORAL | Status: DC
  Administered 2019-12-10: 25 mg via ORAL
  Filled 2019-12-10: qty 1

## 2019-12-10 MED ORDER — LACTATED RINGERS IV SOLN: INTRAVENOUS | Status: DC | PRN

## 2019-12-10 MED ORDER — IRBESARTAN 150 MG PO TABS
150.0000 mg | ORAL_TABLET | Freq: Every day | ORAL | Status: DC
  Administered 2019-12-10 – 2019-12-11 (×2): 150 mg via ORAL
  Filled 2019-12-10 (×2): qty 1

## 2019-12-10 MED ORDER — ONDANSETRON HCL 4 MG/2ML IJ SOLN: 4.0000 mg | Freq: Four times a day (QID) | INTRAMUSCULAR | Status: DC | PRN

## 2019-12-10 MED ORDER — PROPOFOL 10 MG/ML IV BOLUS
INTRAVENOUS | Status: DC | PRN
  Administered 2019-12-10 (×2): 20 mg via INTRAVENOUS

## 2019-12-10 MED ORDER — NITROGLYCERIN 0.4 MG SL SUBL: 0.4000 mg | SUBLINGUAL_TABLET | SUBLINGUAL | Status: DC | PRN

## 2019-12-10 MED ORDER — AMLODIPINE BESYLATE 5 MG PO TABS
5.0000 mg | ORAL_TABLET | Freq: Every day | ORAL | Status: DC
  Administered 2019-12-10: 5 mg via ORAL
  Filled 2019-12-10: qty 1

## 2019-12-10 MED ORDER — ACETAMINOPHEN 650 MG RE SUPP: 650.0000 mg | Freq: Four times a day (QID) | RECTAL | Status: DC | PRN

## 2019-12-10 MED ORDER — VITAMIN D (ERGOCALCIFEROL) 1.25 MG (50000 UNIT) PO CAPS: 50000.0000 [IU] | ORAL_CAPSULE | ORAL | Status: DC

## 2019-12-10 MED ORDER — FOLIC ACID 1 MG PO TABS
1.0000 mg | ORAL_TABLET | Freq: Every day | ORAL | Status: DC
  Administered 2019-12-11: 1 mg via ORAL
  Filled 2019-12-10 (×2): qty 1

## 2019-12-10 MED ORDER — EPHEDRINE SULFATE-NACL 50-0.9 MG/10ML-% IV SOSY
PREFILLED_SYRINGE | INTRAVENOUS | Status: DC | PRN
  Administered 2019-12-10 (×2): 10 mg via INTRAVENOUS
  Administered 2019-12-10: 5 mg via INTRAVENOUS
  Administered 2019-12-10 (×2): 10 mg via INTRAVENOUS

## 2019-12-10 MED ORDER — POTASSIUM CHLORIDE 2 MEQ/ML IV SOLN: INTRAVENOUS | Status: DC

## 2019-12-10 MED ORDER — ONDANSETRON HCL 4 MG PO TABS: 4.0000 mg | ORAL_TABLET | Freq: Four times a day (QID) | ORAL | Status: DC | PRN

## 2019-12-10 MED ORDER — PEG-KCL-NACL-NASULF-NA ASC-C 100 G PO SOLR
1.0000 | Freq: Once | ORAL | Status: AC
  Administered 2019-12-10: 200 g via ORAL
  Filled 2019-12-10: qty 1

## 2019-12-10 MED ORDER — ACETAMINOPHEN 325 MG PO TABS: 650.0000 mg | ORAL_TABLET | Freq: Four times a day (QID) | ORAL | Status: DC | PRN

## 2019-12-10 MED ORDER — POTASSIUM CHLORIDE CRYS ER 20 MEQ PO TBCR
40.0000 meq | EXTENDED_RELEASE_TABLET | Freq: Once | ORAL | Status: AC
  Administered 2019-12-10: 40 meq via ORAL
  Filled 2019-12-10: qty 2

## 2019-12-10 MED ORDER — ROSUVASTATIN CALCIUM 20 MG PO TABS
40.0000 mg | ORAL_TABLET | Freq: Every day | ORAL | Status: DC
  Administered 2019-12-10 (×2): 40 mg via ORAL
  Filled 2019-12-10 (×2): qty 2

## 2019-12-10 MED ORDER — METOPROLOL SUCCINATE ER 25 MG PO TB24
25.0000 mg | ORAL_TABLET | Freq: Every day | ORAL | Status: DC
  Administered 2019-12-11: 25 mg via ORAL
  Filled 2019-12-10: qty 1

## 2019-12-10 MED ORDER — EZETIMIBE 10 MG PO TABS
10.0000 mg | ORAL_TABLET | Freq: Every day | ORAL | Status: DC
  Administered 2019-12-11: 10 mg via ORAL
  Filled 2019-12-10 (×2): qty 1

## 2019-12-10 SURGICAL SUPPLY — 22 items

## 2019-12-10 NOTE — Op Note (Addendum)
[facility] Patient Name: Manuel Barker Procedure Date: 12/10/2019 MRN: [id number] Attending MD: Ladene Artist , MD Date of Birth: 06/14/1950 CSN: [id number] Age: 69 Admit Type: Inpatient Procedure:                Colonoscopy Indications:              Hematochezia post colonoscopy with polypectomies on                            12/1 Providers:                Pricilla Riffle. Fuller Plan, MD, Clyde Lundborg, RN, Tyrone Apple, Technician, Laureen Abrahams, Caryl Pina                            CRNA Referring MD:             [facility] Medicines:                Monitored Anesthesia Care Complications:            No immediate complications. Estimated blood loss:                            None. Estimated Blood Loss:     Estimated blood loss: none. Procedure:                Pre-Anesthesia Assessment:                           - Prior to the procedure, a History and Physical                            was performed, and patient medications and                            allergies were reviewed. The patient's tolerance of                            previous anesthesia was also reviewed. The risks                            and benefits of the procedure and the sedation                            options and risks were discussed with the patient.                            All questions were answered, and informed consent                            was obtained. Prior Anticoagulants: The patient has                            taken Plavix (clopidogrel), last dose was 2 days  prior to procedure. ASA Grade Assessment: III - A                            patient with severe systemic disease. After                            reviewing the risks and benefits, the patient was                            deemed in satisfactory condition to undergo the                            procedure.                           After obtaining informed consent,  the colonoscope                            was passed under direct vision. Throughout the                            procedure, the patient's blood pressure, pulse, and                            oxygen saturations were monitored continuously. The                            [id number]-[id number] ([id number] Olympus pediatric colonscope                            was introduced through the anus and advanced to the                            the cecum, identified by appendiceal orifice and                            ileocecal valve. The ileocecal valve, appendiceal                            orifice, and rectum were photographed. The quality                            of the bowel preparation was good. The colonoscopy                            was performed without difficulty. The patient                            tolerated the procedure well. Scope In: 2:51:53 PM Scope Out: 3:29:41 PM Scope Withdrawal Time: 0 hours 34 minutes 54 seconds  Total Procedure Duration: 0 hours 37 minutes 48 seconds  Findings:      The perianal and digital rectal examinations were normal.      One 1 cm ulcer was found in the ascending colon with one  clip in the       middle. Prior hot snare polypectomy site. No bleeding was present. A few       small flat pigmented spots in the ulcer were present. For hemostasis,       three additional hemostatic clips were successfully placed (MR       conditional) and the ulcer site was completely closed. There was no       bleeding during, or at the end, of the procedure.      A single (solitary) five mm ulcer was found in the proximal transverse       colon. Prior cold polypectomy site. Oozing was present. Stigmata of       recent bleeding were present. For hemostasis, three hemostatic clips       were successfully placed (MR conditional) and the ulcer was completely       closed. There was no bleeding at the end of the maneuver.      A single (solitary) six mm ulcer was found in the  mid transverse colon.       Prior cold polypectomy site. No bleeding was present. No stigmata of       recent bleeding were seen. The ulcer was clean based. For hemostasis,       two hemostatic clips were successfully placed (MR conditional). The       ulcer was completely closed. There was no bleeding during, or at the       end, of the procedure.      A 5 mm polyp was found in the transverse colon. The polyp was sessile.       The polyp was removed with a cold snare. Resection and retrieval were       complete. Immediate mild persistent oozing noted. For hemostasis, two       hemostatic clips were successfully placed (MR conditional). The       polypectomy site was completely closed. There was no bleeding at the end       of the maneuver.      A single (solitary) eight mm ulcer was found in the descending colon.       Prior cold polypectomy site. No bleeding was present. No stigmata of       recent bleeding were seen. The ulcer was clean based. For hemostasis,       two hemostatic clips were successfully placed (MR conditional). The       ulcer was completely closed. There was no bleeding during, or at the       end, of the procedure.      A few medium-sized localized angiodysplastic lesions without bleeding       were found in the rectum.      Internal hemorrhoids were found during retroflexion. The hemorrhoids       were small and Grade I (internal hemorrhoids that do not prolapse).      The exam was otherwise without abnormality on direct and retroflexion       views. Impression:               - A single (solitary) ulcer in the ascending colon                            with prior clip in the center of the ulcer.  Stigmata of recent bleed were present. 3 additional                            clips (MR conditional) were placed.                           - A single (solitary) ulcer in the proximal                            transverse colon which was oozing.  3 clips (MR                            conditional) were placed. Hemostasis achieved.                           - A single (solitary) ulcer in the mid transverse                            colon. 2 clips (MR conditional) were placed.                           - One 5 mm polyp in the transverse colon, removed                            with a cold snare. Resected and retrieved. Mild,                            persistent oozing noted. 2 clips (MR conditional)                            were placed. Hemostasis achieved.                           - A single (solitary) ulcer in the descending                            colon. 2 clips (MR conditional) were placed.                           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                           - Internal hemorrhoids.                           - The examination was otherwise normal on direct                            and retroflexion views. Moderate Sedation:      Not Applicable - Patient had care per Anesthesia. Recommendation:           - Resume Plavix (  clopidogrel) in 5 days at prior                            dose. Refer to managing physician for further                            adjustment of therapy.                           - Patient has a contact number available for                            emergencies. The signs and symptoms of potential                            delayed complications were discussed with the                            patient. Return to normal activities tomorrow.                            Written discharge instructions were provided to the                            patient.                           - Full liquid diet today, advance to a low fiber                            diet tomorrow for 1 week and after that resume                            previous diet.                           - Continue present medications.                            - No strenuous activity for 2 weeks.                           - Await pathology results. Procedure Code(s):        --- Professional ---                           [phone], 59, Colonoscopy, flexible; with control of                            bleeding, any method                           45385, Colonoscopy, flexible; with removal of                            tumor(s), polyp(s), or other lesion(s)  by snare                            technique Diagnosis Code(s):        --- Professional ---                           K63.3, Ulcer of intestine                           K63.5, Polyp of colon                           K55.20, Angiodysplasia of colon without hemorrhage                           K64.0, First degree hemorrhoids                           K92.1, Melena (includes Hematochezia) CPT copyright 2019 [employer]. All rights reserved. The codes documented in this report are preliminary and upon coder review may  be revised to meet current compliance requirements. Ladene Artist, MD 12/10/2019 3:57:35 PM This report has been signed electronically. Number of Addenda: 0

## 2019-12-10 NOTE — Transfer of Care (Signed)
Immediate Anesthesia Transfer of Care Note  Patient: Manuel Barker  Procedure(s) Performed: COLONOSCOPY WITH PROPOFOL (N/A ) HEMOSTASIS CLIP PLACEMENT POLYPECTOMY  Patient Location: Endoscopy Unit  Anesthesia Type:MAC  Level of Consciousness: drowsy and responds to stimulation  Airway & Oxygen Therapy: Patient Spontanous Breathing and Patient connected to face mask oxygen  Post-op Assessment: Report given to RN, Post -op Vital signs reviewed and stable and Patient moving all extremities  Post vital signs: Reviewed and stable  Last Vitals:  Vitals Value Taken Time  BP    Temp    Pulse    Resp    SpO2      Last Pain:  Vitals:   12/10/19 1342  TempSrc: Oral  PainSc: 0-No pain         Complications: No complications documented.

## 2019-12-10 NOTE — ED Notes (Signed)
Pt ambulatory to bathroom, no assistance needed.  
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
## 2019-12-10 NOTE — Anesthesia Preprocedure Evaluation (Signed)
Anesthesia Evaluation  Patient identified by MRN, date of birth, ID band Patient awake    Reviewed: Allergy & Precautions, NPO status , Patient's Chart, lab work & pertinent test results  History of Anesthesia Complications Negative for: history of anesthetic complications  Airway Mallampati: III  TM Distance: >3 FB Neck ROM: Full    Dental  (+) Dental Advisory Given   Pulmonary shortness of breath, sleep apnea and Continuous Positive Airway Pressure Ventilation , neg COPD, neg recent URI,  Covid-19 Nucleic Acid Test Results Lab Results      Component                Value               Date                      [place]              NEGATIVE            12/09/2019                [place]              NEGATIVE            08/17/2019              breath sounds clear to auscultation       Cardiovascular hypertension, Pt. on medications (-) angina+ CAD, + Past MI and + Cardiac Stents   Rhythm:Regular   Dist Cx lesion is 90% stenosed, instent restenosis.  Scoring balloon angioplasty was performed using a BALLOON WOLVERINE 2.50X10, followed by PTCA witha 3.0 semicompliant balloon.  Post intervention, there is a 0% residual stenosis.  Ost Cx to Prox Cx lesion is 70% stenosed.  Scoring balloon angioplasty was performed using a BALLOON WOLVERINE 2.50X10, followed by PTCA witha 3.0 semicompliant balloon.  Post intervention, there is a 10% residual stenosis.  Prox LAD lesion is 50% stenosed.  Ost 1st Diag to 1st Diag lesion is 90% stenosed.  Prox RCA lesion is 50% stenosed.  Ost RPDA to RPDA lesion is 40% stenosed.  Dist LAD lesion is 90% stenosed, unchanged from prior.  The left ventricular ejection fraction is 50-55% by visual estimate.  There is no aortic valve stenosis.  The left ventricular systolic function is normal.  LV end diastolic pressure is normal.    Neuro/Psych negative neurological ROS  negative  psych ROS   GI/Hepatic negative GI ROS, Neg liver ROS,   Endo/Other  negative endocrine ROS  Renal/GU Renal diseasenegative Renal ROSLab Results      Component                Value               Date                      CREATININE               0.76                12/10/2019                Musculoskeletal negative musculoskeletal ROS (+)   Abdominal   Peds  Hematology  (+) Blood dyscrasia, , Lab Results      Component                Value  Date                      WBC                      5.6                 12/10/2019                HGB                      12.0 (L)            12/10/2019                HCT                      37.0 (L)            12/10/2019                MCV                      90.2                12/10/2019                PLT                      190                 12/10/2019            plavix   Anesthesia Other Findings   Reproductive/Obstetrics                             Anesthesia Physical Anesthesia Plan  ASA: III  Anesthesia Plan: MAC   Post-op Pain Management:    Induction:   PONV Risk Score and Plan: 1 and Propofol infusion and Treatment may vary due to age or medical condition  Airway Management Planned: Nasal Cannula  Additional Equipment:   Intra-op Plan:   Post-operative Plan:   Informed Consent: I have reviewed the patients History and Physical, chart, labs and discussed the procedure including the risks, benefits and alternatives for the proposed anesthesia with the patient or authorized representative who has indicated his/her understanding and acceptance.     Dental advisory given  Plan Discussed with: CRNA and Anesthesiologist  Anesthesia Plan Comments:         Anesthesia Quick Evaluation

## 2019-12-10 NOTE — Progress Notes (Signed)
PROGRESS NOTE    Manuel Barker  MAY:[id number] DOB: [birth year] DOA: 12/09/2019 PCP: [name], [name], MD   Chief Complaint  Patient presents with  . Blood In Stools  Brief Narrative: 69 year old male with CAD history of a stent in 2011, BPH/prostate cancer, HTN, HLD, OSA who had colonoscopy with polypectomies on December 1 and had prostate examination following that and noticed bleeding rectally, was seen at GI office and sent to the ED for evaluation Patient had three colonic polypectomies with one clip placed in transverse and descending colon. In the ED acute blood loss anemia.  Subjective: Still having rectal bleed Had brown black deposit- that goes red on coming to toilet Bowel- had Twice since coming to ED Denies abdomen pain, chest pain SOB, nausea  Or vomiting   Assessment & Plan:  Rectal bleed, in the setting of recent polyp removal, post polypectomy bleeding suspected, off Plavix on Nov 25 for colonoscopy- started on dec 1, then stopped on Dec 5th.  Still having blood in the stool but does not appear bright red as per the patient.  Notify GI, check serial H&H.  Acute blood loss anemia: abt 2 gm drop since, monitor h/h. Recent Labs  Lab 12/09/19 1449 12/10/19 0007 12/10/19 0618  HGB 12.9* 10.9* 10.7*  HCT 41.1 33.8* 33.3*   Hypokalemia/Hypoglycemia/Hypocalcemia: On repeat blood work labs are stable.  Earlier patient received oral potassium chloride, hold off on further replacement, repeat labs in am.   CAD/Dyslipidemia/Hypertension- hx of stent in 2011 and had angio in 2018, on asa,plavix- plavix on hold, ON asa, crestor/zetia, irbesartan, amlodipine, metoprolol. BP is stable.Monitor BP, no chest pain.  OSA on CPAP-cont qhs  Cancer of prostate with intermediate recurrence risk (stage T2b-c or Gleason 7 or PSA 10-20)-sees Urology  Morbid Obesity w/ Body mass index is 33.09 kg/m.  Will benefit with weight loss PC follow-up  Nutrition: Diet Order             Diet clear liquid Room service appropriate? Yes; Fluid consistency: Thin  Diet effective now                  DVT prophylaxis: SCDs Start: 12/10/19 0007 Code Status:   Code Status: Full Code  Family Communication: plan of care discussed with patient at bedside.  Status is: admitted as Observation Remains hospitalized for ongoing management of rectal bleeding gi eval and further studies- scope Dispo: The patient is from: Home              Anticipated d/c is to: Home              Anticipated d/c date is1- 2 days              Patient currently is not medically stable to d/c. Consultants:see note  Procedures:see note  Culture/Microbiology No results found for: SDES, SPECREQUEST, CULT, REPTSTATUS  Other culture-see note  Medications: Scheduled Meds: . amLODipine  5 mg Oral Daily  . aspirin EC  81 mg Oral Daily  . ezetimibe  10 mg Oral Daily  . folic acid  1 mg Oral Daily  . irbesartan  150 mg Oral Daily  . metoprolol succinate  25 mg Oral Daily  . pantoprazole (PROTONIX) IV  40 mg Intravenous Q12H  . rosuvastatin  40 mg Oral QHS  . [START ON 12/15/2019] Vitamin D (Ergocalciferol)  50,000 Units Oral Q7 days   Continuous Infusions: . sodium chloride 100 mL/hr at 12/10/19 0029    Antimicrobials:  Anti-infectives (From admission, onward)   None     Objective: Vitals: Today's Vitals   12/09/19 2230 12/10/19 0100 12/10/19 0200 12/10/19 0618  BP: 120/79 110/70 122/74 118/77  Pulse: 67 62 (!) 57 68  Resp: 17 15 16 15   Temp:      TempSrc:      SpO2: 98% 98% 96% 98%  Weight:      Height:      PainSc:       No intake or output data in the 24 hours ending 12/10/19 0730 Filed Weights   12/09/19 1436  Weight: 93 kg   Weight change:   Intake/Output from previous day: No intake/output data recorded. Intake/Output this shift: No intake/output data recorded.  Examination: General exam: AAOx3 ,NAD, weak appearing. HEENT:Oral mucosa moist, Ear/Nose WNL  grossly,dentition normal. Respiratory system: bilaterally clear,no wheezing or crackles,no use of accessory muscle, non tender. Cardiovascular system: S1 & S2 +, regular, No JVD. Gastrointestinal system: Abdomen soft, no tenderness,ND, BS+. Nervous System:Alert, awake, moving extremities and grossly nonfocal Extremities: No edema, distal peripheral pulses palpable.  Skin: No rashes,no icterus. MSK: Normal muscle bulk,tone, power  Data Reviewed: I have personally reviewed following labs and imaging studies CBC: Recent Labs  Lab 12/09/19 1449 12/10/19 0007 12/10/19 0618  WBC 5.6 6.1 5.6  HGB 12.9* 10.9* 10.7*  HCT 41.1 33.8* 33.3*  MCV 91.9 90.1 89.8  PLT 172 161 751   Basic Metabolic Panel: Recent Labs  Lab 12/09/19 1449 12/10/19 0618  NA 137 144  K 3.8 2.3*  CL 107 123*  CO2 22 16*  GLUCOSE 112* 62*  BUN 11 8  CREATININE 0.97 0.48*  CALCIUM 8.6* 5.0*   GFR: Estimated Creatinine Clearance: 93.1 mL/min (A) (by C-G formula based on SCr of 0.48 mg/dL (L)). Liver Function Tests: Recent Labs  Lab 12/09/19 1449 12/10/19 0618  AST 21 13*  ALT 28 16  ALKPHOS 58 32*  BILITOT 0.6 0.6  PROT 6.7 3.6*  ALBUMIN 3.8 2.1*   No results for input(s): LIPASE, AMYLASE in the last 168 hours. No results for input(s): AMMONIA in the last 168 hours. Coagulation Profile: No results for input(s): INR, PROTIME in the last 168 hours. Cardiac Enzymes: No results for input(s): CKTOTAL, CKMB, CKMBINDEX, TROPONINI in the last 168 hours. BNP (last 3 results) No results for input(s): PROBNP in the last 8760 hours. HbA1C: No results for input(s): HGBA1C in the last 72 hours. CBG: No results for input(s): GLUCAP in the last 168 hours. Lipid Profile: No results for input(s): CHOL, HDL, LDLCALC, TRIG, CHOLHDL, LDLDIRECT in the last 72 hours. Thyroid Function Tests: No results for input(s): TSH, T4TOTAL, FREET4, T3FREE, THYROIDAB in the last 72 hours. Anemia Panel: No results for input(s):  VITAMINB12, FOLATE, FERRITIN, TIBC, IRON, RETICCTPCT in the last 72 hours. Sepsis Labs: No results for input(s): PROCALCITON, LATICACIDVEN in the last 168 hours.  Recent Results (from the past 240 hour(s))  Resp Panel by RT-PCR (Flu A&B, Covid) Nasopharyngeal Swab     Status: None   Collection Time: 12/09/19  9:30 PM   Specimen: Nasopharyngeal Swab; Nasopharyngeal(NP) swabs in vial transport medium  Result Value Ref Range Status   SARS Coronavirus 2 by RT PCR NEGATIVE NEGATIVE Final    Comment: (NOTE) SARS-CoV-2 target nucleic acids are NOT DETECTED.  The SARS-CoV-2 RNA is generally detectable in upper respiratory specimens during the acute phase of infection. The lowest concentration of SARS-CoV-2 viral copies this assay can detect is 138 copies/mL. A negative result  does not preclude SARS-Cov-2 infection and should not be used as the sole basis for treatment or other patient management decisions. A negative result may occur with  improper specimen collection/handling, submission of specimen other than nasopharyngeal swab, presence of viral mutation(s) within the areas targeted by this assay, and inadequate number of viral copies(<138 copies/mL). A negative result must be combined with clinical observations, patient history, and epidemiological information. The expected result is Negative.  Fact Sheet for Patients:  EntrepreneurPulse.com.au  Fact Sheet for Healthcare Providers:  IncredibleEmployment.be  This test is no t yet approved or cleared by the [place] FDA and  has been authorized for detection and/or diagnosis of SARS-CoV-2 by FDA under an Emergency Use Authorization (EUA). This EUA will remain  in effect (meaning this test can be used) for the duration of the COVID-19 declaration under Section 564(b)(1) of the Act, 21 U.S.C.section 360bbb-3(b)(1), unless the authorization is terminated  or revoked sooner.       Influenza A  by PCR NEGATIVE NEGATIVE Final   Influenza B by PCR NEGATIVE NEGATIVE Final    Comment: (NOTE) The Xpert Xpress SARS-CoV-2/FLU/RSV plus assay is intended as an aid in the diagnosis of influenza from Nasopharyngeal swab specimens and should not be used as a sole basis for treatment. Nasal washings and aspirates are unacceptable for Xpert Xpress SARS-CoV-2/FLU/RSV testing.  Fact Sheet for Patients: EntrepreneurPulse.com.au  Fact Sheet for Healthcare Providers: IncredibleEmployment.be  This test is not yet approved or cleared by the [place] FDA and has been authorized for detection and/or diagnosis of SARS-CoV-2 by FDA under an Emergency Use Authorization (EUA). This EUA will remain in effect (meaning this test can be used) for the duration of the COVID-19 declaration under Section 564(b)(1) of the Act, 21 U.S.C. section 360bbb-3(b)(1), unless the authorization is terminated or revoked.  Performed at [facility], [place] [address] [place], [place] [address]      Radiology Studies: No results found.   LOS: 0 days   Antonieta Pert, MD [facility]  12/10/2019, 7:30 AM

## 2019-12-10 NOTE — ED Notes (Signed)
Per GI MD Pyrtle, ok to administer pts BP meds at this time, hold all other PO meds until after colonoscopy.

## 2019-12-10 NOTE — ED Notes (Signed)
Pt aware that bowel prep to be completed by 12p.  Bedside commode setup for pt.

## 2019-12-10 NOTE — ED Notes (Signed)
Pt transported to endo. Pt reports he will be able to arrange transportation home if he is discharged tonight.

## 2019-12-10 NOTE — Interval H&P Note (Signed)
History and Physical Interval Note:  12/10/2019 2:31 PM  Manuel Barker  has presented today for surgery, with the diagnosis of post polypectomy bleed.  The various methods of treatment have been discussed with the patient and family. After consideration of risks, benefits and other options for treatment, the patient has consented to  Procedure(s): COLONOSCOPY WITH PROPOFOL (N/A) as a surgical intervention.  The patient's history has been reviewed, patient examined, no change in status, stable for surgery.  I have reviewed the patient's chart and labs.  Questions were answered to the patient's satisfaction.     Pricilla Riffle. Fuller Plan

## 2019-12-10 NOTE — Plan of Care (Signed)

## 2019-12-10 NOTE — ED Notes (Signed)
Paper consent obtained from pt for scheduled procedure, left at bedside.
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
## 2019-12-11 ENCOUNTER — Encounter: Payer: Self-pay | Admitting: Gastroenterology

## 2019-12-11 ENCOUNTER — Other Ambulatory Visit: Payer: Self-pay

## 2019-12-11 DIAGNOSIS — K635 Polyp of colon: Secondary | ICD-10-CM | POA: Diagnosis not present

## 2019-12-11 DIAGNOSIS — K922 Gastrointestinal hemorrhage, unspecified: Secondary | ICD-10-CM

## 2019-12-11 DIAGNOSIS — Z9889 Other specified postprocedural states: Secondary | ICD-10-CM

## 2019-12-11 DIAGNOSIS — K625 Hemorrhage of anus and rectum: Secondary | ICD-10-CM

## 2019-12-11 LAB — CBC
HCT: 32.7 % — ABNORMAL LOW (ref 39.0–52.0)
HCT: 32.9 % — ABNORMAL LOW (ref 39.0–52.0)
Hemoglobin: 10.7 g/dL — ABNORMAL LOW (ref 13.0–17.0)
Hemoglobin: 10.7 g/dL — ABNORMAL LOW (ref 13.0–17.0)
MCH: 29.1 pg (ref 26.0–34.0)
MCH: 29.2 pg (ref 26.0–34.0)
MCHC: 32.5 g/dL (ref 30.0–36.0)
MCHC: 32.7 g/dL (ref 30.0–36.0)
MCV: 88.9 fL (ref 80.0–100.0)
MCV: 89.6 fL (ref 80.0–100.0)
Platelets: 160 10*3/uL (ref 150–400)
Platelets: 166 10*3/uL (ref 150–400)
RBC: 3.67 MIL/uL — ABNORMAL LOW (ref 4.22–5.81)
RBC: 3.68 MIL/uL — ABNORMAL LOW (ref 4.22–5.81)
RDW: 14.3 % (ref 11.5–15.5)
RDW: 14.4 % (ref 11.5–15.5)
WBC: 5.3 10*3/uL (ref 4.0–10.5)
WBC: 6.3 10*3/uL (ref 4.0–10.5)
nRBC: 0 % (ref 0.0–0.2)
nRBC: 0 % (ref 0.0–0.2)

## 2019-12-11 LAB — BASIC METABOLIC PANEL
Anion gap: 8 (ref 5–15)
BUN: 9 mg/dL (ref 8–23)
CO2: 23 mmol/L (ref 22–32)
Calcium: 8.6 mg/dL — ABNORMAL LOW (ref 8.9–10.3)
Chloride: 109 mmol/L (ref 98–111)
Creatinine, Ser: 0.87 mg/dL (ref 0.61–1.24)
GFR, Estimated: >60 mL/min (ref >60)
Glucose, Bld: 91 mg/dL (ref 70–99)
Potassium: 3.6 mmol/L (ref 3.5–5.1)
Sodium: 140 mmol/L (ref 135–145)

## 2019-12-11 LAB — SURGICAL PATHOLOGY

## 2019-12-11 MED ORDER — CLOPIDOGREL BISULFATE 75 MG PO TABS
75.0000 mg | ORAL_TABLET | Freq: Every day | ORAL | 0 refills | Status: DC
Start: 2019-12-16 — End: 2020-02-06

## 2019-12-11 NOTE — Anesthesia Postprocedure Evaluation (Signed)
Anesthesia Post Note  Patient: Manuel Barker  Procedure(s) Performed: COLONOSCOPY WITH PROPOFOL (N/A ) HEMOSTASIS CLIP PLACEMENT POLYPECTOMY     Patient location during evaluation: Endoscopy Anesthesia Type: MAC Level of consciousness: awake and alert Pain management: pain level controlled Vital Signs Assessment: post-procedure vital signs reviewed and stable Respiratory status: spontaneous breathing, nonlabored ventilation, respiratory function stable and patient connected to nasal cannula oxygen Cardiovascular status: stable and blood pressure returned to baseline Postop Assessment: no apparent nausea or vomiting Anesthetic complications: no   No complications documented.  Last Vitals:  Vitals:   12/10/19 2022 12/11/19 0700  BP: 113/77 107/73  Pulse: 74 79  Resp: 16 20  Temp: 36.7 C 36.7 C  SpO2: 100% 99%    Last Pain:  Vitals:   12/11/19 1007  TempSrc:   PainSc: 0-No pain                 Avish Torry

## 2019-12-11 NOTE — Discharge Summary (Signed)
Physician Discharge Summary  Manuel Barker:[id number] DOB: 1950/06/27 DOA: 12/09/2019  PCP: [name], [name], MD  Admit date: 12/09/2019 Discharge date: 12/11/2019  Admitted From: home Disposition:  home  Recommendations for Outpatient Follow-up:  1. Follow up with PCP in 1-2 weeks 2. Follow up with GI next week for repeat labs  Home Health: none Equipment/Devices: none  Discharge Condition: stable CODE STATUS: Full code Diet recommendation: regular  HPI: Per admitting MD, Manuel Barker is a 69 y.o. male with medical history significant of coronary artery disease, BPH, prostate cancer, hypertension, obstructive sleep apnea, who is currently on aspirin and Plavix but had colonoscopy with polypectomy on December 1.  Patient has 3 colon polypectomies with 1 clip placed in transverse and descending colon.  He presented today with multiple episodes of rectal bleeding including black stools feeling the commode.  Patient has dropped about 1-1/2 g hemoglobin in the last few days.  He denied any hematemesis.  Denied any abdominal pain.  He was initially seen by GI who sent him over to the ER for further evaluation.  He is hemodynamically stable but appears to have confirmed GI bleed with drop in hemoglobin.  Patient being admitted to the hospital for further evaluation and treatment.Marland Kitchen  Hospital Course / Discharge diagnoses: Principal problem Rectal bleed -patient was admitted to the hospital with rectal bleed in the setting of recent polypectomy.  His Plavix has been held, gastroenterology was consulted and patient underwent a colonoscopy on 12/7 which showed several ulcers with stigmata of recent bleeds, status post clip placements (full endoscopy report below).  His rectal bleeding has resolved, hemoglobin has remained stable, discussed with GI and he will be discharged home in stable condition with close outpatient follow-up  Active problems Acute blood loss anemia due to rectal  bleed-hemoglobin lower than his baseline but stable.  No further bleeding. Hypokalemia/hypoglycemia/hypocalcemia -stable CAD, hyperlipidemia, hypertension-stable, resume home medications OSA on CPAP-continue home CPAP Prostate cancer-outpatient urology follow-up Obesity-based on BMI of 33, he would benefit from weight loss  Discharge Instructions   Allergies as of 12/11/2019   No Known Allergies     Medication List    TAKE these medications   amLODipine 5 MG tablet Commonly known as: NORVASC Take 5 mg by mouth daily.   aspirin 81 MG tablet Take 1 tablet (81 mg total) by mouth daily.   clopidogrel 75 MG tablet Commonly known as: PLAVIX Take 1 tablet (75 mg total) by mouth daily. Start taking on: December 16, 2019 What changed: These instructions start on December 16, 2019. If you are unsure what to do until then, ask your doctor or other care provider.   ezetimibe 10 MG tablet Commonly known as: ZETIA Take 1 tablet (10 mg total) by mouth daily.   metoprolol succinate 25 MG 24 hr tablet Commonly known as: TOPROL-XL Take 25 mg by mouth daily.   nitroGLYCERIN 0.4 MG SL tablet Commonly known as: NITROSTAT PLACE 1 TABLET UNDER THE TONGUE IF NEEDED EVERY 5 MINUTES FOR CHEST PAIN FOR 3 DOSES, IF NO RELIEF AFTER FIRST DOSE CALL 911 What changed:   how much to take  how to take this  when to take this  reasons to take this  additional instructions   rosuvastatin 40 MG tablet Commonly known as: CRESTOR Take 1 tablet (40 mg total) by mouth at bedtime.   telmisartan 40 MG tablet Commonly known as: MICARDIS Take 1 tablet (40 mg total) by mouth daily.   Vitamin  D (Ergocalciferol) 1.25 MG (50000 UNIT) Caps capsule Commonly known as: DRISDOL Take 1 capsule (50,000 Units total) by mouth every 7 (seven) days for 12 doses.       Follow-up Information    [name], [name], MD. Schedule an appointment as soon as possible for a visit in 1 week(s).   Specialty:  Internal Medicine Contact information: [place] [address] [phone]        [name], MD .   Specialty: Cardiology Contact information: [place] [address] [place] [address] [phone]               Consultations:  GI  Procedures/Studies:  Colonoscopy Impression:               - A single (solitary) ulcer in the ascending colon                            with prior clip in the center of the ulcer.                            Stigmata of recent bleed were present. 3 additional                            clips (MR conditional) were placed.                           - A single (solitary) ulcer in the proximal                            transverse colon which was oozing. 3 clips (MR                            conditional) were placed. Hemostasis achieved.                           - A single (solitary) ulcer in the mid transverse                            colon. 2 clips (MR conditional) were placed.                           - One 5 mm polyp in the transverse colon, removed                            with a cold snare. Resected and retrieved. Mild,                            persistent oozing noted. 2 clips (MR conditional)                            were placed. Hemostasis achieved.                           - A single (solitary) ulcer in the descending  colon. 2 clips (MR conditional) were placed.                           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                           - Internal hemorrhoids.                           - The examination was otherwise normal on direct                            and retroflexion views.    No results found.   Subjective: - no chest pain, shortness of breath, no abdominal pain, nausea or vomiting.   Discharge Exam: BP 107/73 (BP Location: Left Arm)   Pulse 79   Temp 98.1 F  (36.7 C) (Oral)   Resp 20   Ht 5\' 6"  (1.676 m)   Wt 93 kg   SpO2 99%   BMI 33.09 kg/m   General: Pt is alert, awake, not in acute distress Cardiovascular: RRR, S1/S2 +, no rubs, no gallops Respiratory: CTA bilaterally, no wheezing, no rhonchi Abdominal: Soft, NT, ND, bowel sounds +   The results of significant diagnostics from this hospitalization (including imaging, microbiology, ancillary and laboratory) are listed below for reference.     Microbiology: Recent Results (from the past 240 hour(s))  Resp Panel by RT-PCR (Flu A&B, Covid) Nasopharyngeal Swab     Status: None   Collection Time: 12/09/19  9:30 PM   Specimen: Nasopharyngeal Swab; Nasopharyngeal(NP) swabs in vial transport medium  Result Value Ref Range Status   SARS Coronavirus 2 by RT PCR NEGATIVE NEGATIVE Final    Comment: (NOTE) SARS-CoV-2 target nucleic acids are NOT DETECTED.  The SARS-CoV-2 RNA is generally detectable in upper respiratory specimens during the acute phase of infection. The lowest concentration of SARS-CoV-2 viral copies this assay can detect is 138 copies/mL. A negative result does not preclude SARS-Cov-2 infection and should not be used as the sole basis for treatment or other patient management decisions. A negative result may occur with  improper specimen collection/handling, submission of specimen other than nasopharyngeal swab, presence of viral mutation(s) within the areas targeted by this assay, and inadequate number of viral copies(<138 copies/mL). A negative result must be combined with clinical observations, patient history, and epidemiological information. The expected result is Negative.  Fact Sheet for Patients:  EntrepreneurPulse.com.au  Fact Sheet for Healthcare Providers:  IncredibleEmployment.be  This test is no t yet approved or cleared by the [place] FDA and  has been authorized for detection and/or diagnosis of SARS-CoV-2  by FDA under an Emergency Use Authorization (EUA). This EUA will remain  in effect (meaning this test can be used) for the duration of the COVID-19 declaration under Section 564(b)(1) of the Act, 21 U.S.C.section 360bbb-3(b)(1), unless the authorization is terminated  or revoked sooner.       Influenza A by PCR NEGATIVE NEGATIVE Final   Influenza B by PCR NEGATIVE NEGATIVE Final    Comment: (NOTE) The Xpert Xpress SARS-CoV-2/FLU/RSV plus assay is intended as an aid in the  diagnosis of influenza from Nasopharyngeal swab specimens and should not be used as a sole basis for treatment. Nasal washings and aspirates are unacceptable for Xpert Xpress SARS-CoV-2/FLU/RSV testing.  Fact Sheet for Patients: EntrepreneurPulse.com.au  Fact Sheet for Healthcare Providers: IncredibleEmployment.be  This test is not yet approved or cleared by the [place] FDA and has been authorized for detection and/or diagnosis of SARS-CoV-2 by FDA under an Emergency Use Authorization (EUA). This EUA will remain in effect (meaning this test can be used) for the duration of the COVID-19 declaration under Section 564(b)(1) of the Act, 21 U.S.C. section 360bbb-3(b)(1), unless the authorization is terminated or revoked.  Performed at [facility], [place] [address], [place], [place] [address]      Labs: Basic Metabolic Panel: Recent Labs  Lab 12/09/19 1449 12/10/19 0618 12/10/19 0803 12/11/19 0032  NA 137 144 140 140  K 3.8 2.3* 3.9 3.6  CL 107 123* 110 109  CO2 22 16* 24 23  GLUCOSE 112* 62* 89 91  BUN 11 8 11 9   CREATININE 0.97 0.48* 0.76 0.87  CALCIUM 8.6* 5.0* 7.9* 8.6*   Liver Function Tests: Recent Labs  Lab 12/09/19 1449 12/10/19 0618  AST 21 13*  ALT 28 16  ALKPHOS 58 32*  BILITOT 0.6 0.6  PROT 6.7 3.6*  ALBUMIN 3.8 2.1*   CBC: Recent Labs  Lab 12/10/19 0618 12/10/19 1314 12/10/19 1705 12/11/19 0032 12/11/19 0605   WBC 5.6 5.6 5.3 5.3 6.3  HGB 10.7* 12.0* 9.0* 10.7* 10.7*  HCT 33.3* 37.0* 28.5* 32.7* 32.9*  MCV 89.8 90.2 91.9 88.9 89.6  PLT 162 190 138* 160 166   CBG: No results for input(s): GLUCAP in the last 168 hours. Hgb A1c No results for input(s): HGBA1C in the last 72 hours. Lipid Profile No results for input(s): CHOL, HDL, LDLCALC, TRIG, CHOLHDL, LDLDIRECT in the last 72 hours. Thyroid function studies No results for input(s): TSH, T4TOTAL, T3FREE, THYROIDAB in the last 72 hours.  Invalid input(s): FREET3 Urinalysis    Component Value Date/Time   COLORURINE yellow 10/07/2008 0836   APPEARANCEUR Clear 10/07/2008 0836   LABSPEC 1.020 10/07/2008 0836   PHURINE 5.5 10/07/2008 0836   HGBUR negative 10/07/2008 0836   BILIRUBINUR n 05/18/2015 0848   PROTEINUR trace 05/18/2015 0848   UROBILINOGEN 1.0 05/18/2015 0848   UROBILINOGEN 0.2 10/07/2008 0836   NITRITE n 05/18/2015 0848   NITRITE negative 10/07/2008 0836   LEUKOCYTESUR Negative 05/18/2015 0848    FURTHER DISCHARGE INSTRUCTIONS:   Get Medicines reviewed and adjusted: Please take all your medications with you for your next visit with your Primary MD   Laboratory/radiological data: Please request your Primary MD to go over all hospital tests and procedure/radiological results at the follow up, please ask your Primary MD to get all Hospital records sent to his/her office.   In some cases, they will be blood work, cultures and biopsy results pending at the time of your discharge. Please request that your primary care M.D. goes through all the records of your hospital data and follows up on these results.   Also Note the following: If you experience worsening of your admission symptoms, develop shortness of breath, life threatening emergency, suicidal or homicidal thoughts you must seek medical attention immediately by calling 911 or calling your MD immediately  if symptoms less severe.   You must read complete  instructions/literature along with all the possible adverse reactions/side effects for all the Medicines you take and that have  been prescribed to you. Take any new Medicines after you have completely understood and accpet all the possible adverse reactions/side effects.    Do not drive when taking Pain medications or sleeping medications (Benzodaizepines)   Do not take more than prescribed Pain, Sleep and Anxiety Medications. It is not advisable to combine anxiety,sleep and pain medications without talking with your primary care practitioner   Special Instructions: If you have smoked or chewed Tobacco  in the last 2 yrs please stop smoking, stop any regular Alcohol  and or any Recreational drug use.   Wear Seat belts while driving.   Please note: You were cared for by a hospitalist during your hospital stay. Once you are discharged, your primary care physician will handle any further medical issues. Please note that NO REFILLS for any discharge medications will be authorized once you are discharged, as it is imperative that you return to your primary care physician (or establish a relationship with a primary care physician if you do not have one) for your post hospital discharge needs so that they can reassess your need for medications and monitor your lab values.  Time coordinating discharge: 25 minutes  SIGNED:  Marzetta Board, MD, PhD 12/11/2019, 10:26 AM

## 2019-12-11 NOTE — Discharge Instructions (Signed)
Continue to hold Plavix for 5 additional days.  Okay to resume aspirin.  Follow with [name], [name], MD in 5-7 days  Please get a complete blood count and chemistry panel checked by your Primary MD at your next visit, and again as instructed by your Primary MD. Please get your medications reviewed and adjusted by your Primary MD.  Please request your Primary MD to go over all Hospital Tests and Procedure/Radiological results at the follow up, please get all Hospital records sent to your Prim MD by signing hospital release before you go home.  In some cases, there will be blood work, cultures and biopsy results pending at the time of your discharge. Please request that your primary care M.D. goes through all the records of your hospital data and follows up on these results.  If you had Pneumonia of Lung problems at the Hospital: Please get a 2 view Chest X ray done in 6-8 weeks after hospital discharge or sooner if instructed by your Primary MD.  If you have Congestive Heart Failure: Please call your Cardiologist or Primary MD anytime you have any of the following symptoms:  1) 3 pound weight gain in 24 hours or 5 pounds in 1 week  2) shortness of breath, with or without a dry hacking cough  3) swelling in the hands, feet or stomach  4) if you have to sleep on extra pillows at night in order to breathe  Follow cardiac low salt diet and 1.5 lit/day fluid restriction.  If you have diabetes Accuchecks 4 times/day, Once in AM empty stomach and then before each meal. Log in all results and show them to your primary doctor at your next visit. If any glucose reading is under 80 or above 300 call your primary MD immediately.  If you have Seizure/Convulsions/Epilepsy: Please do not drive, operate heavy machinery, participate in activities at heights or participate in high speed sports until you have seen by Primary MD or a Neurologist and advised to do so again. Per [facility]  statutes, patients with seizures are not allowed to drive until they have been seizure-free for six months.  Use caution when using heavy equipment or power tools. Avoid working on ladders or at heights. Take showers instead of baths. Ensure the water temperature is not too high on the home water heater. Do not go swimming alone. Do not lock yourself in a room alone (i.e. bathroom). When caring for infants or small children, sit down when holding, feeding, or changing them to minimize risk of injury to the child in the event you have a seizure. Maintain good sleep hygiene. Avoid alcohol.   If you had Gastrointestinal Bleeding: Please ask your Primary MD to check a complete blood count within one week of discharge or at your next visit. Your endoscopic/colonoscopic biopsies that are pending at the time of discharge, will also need to followed by your Primary MD.  Get Medicines reviewed and adjusted. Please take all your medications with you for your next visit with your Primary MD  Please request your Primary MD to go over all hospital tests and procedure/radiological results at the follow up, please ask your Primary MD to get all Hospital records sent to his/her office.  If you experience worsening of your admission symptoms, develop shortness of breath, life threatening emergency, suicidal or homicidal thoughts you must seek medical attention immediately by calling 911 or calling your MD immediately  if symptoms less severe.  You must read  complete instructions/literature along with all the possible adverse reactions/side effects for all the Medicines you take and that have been prescribed to you. Take any new Medicines after you have completely understood and accpet all the possible adverse reactions/side effects.   Do not drive or operate heavy machinery when taking Pain medications.   Do not take more than prescribed Pain, Sleep and Anxiety Medications  Special Instructions: If you have smoked  or chewed Tobacco  in the last 2 yrs please stop smoking, stop any regular Alcohol  and or any Recreational drug use.  Wear Seat belts while driving.  Please note You were cared for by a hospitalist during your hospital stay. If you have any questions about your discharge medications or the care you received while you were in the hospital after you are discharged, you can call the unit and asked to speak with the hospitalist on call if the hospitalist that took care of you is not available. Once you are discharged, your primary care physician will handle any further medical issues. Please note that NO REFILLS for any discharge medications will be authorized once you are discharged, as it is imperative that you return to your primary care physician (or establish a relationship with a primary care physician if you do not have one) for your aftercare needs so that they can reassess your need for medications and monitor your lab values.  You can reach the hospitalist office at phone [phone] or fax [phone]   If you do not have a primary care physician, you can call [phone] for a physician referral.  Activity: As tolerated with Full fall precautions use walker/cane & assistance as needed    Diet: regular  Disposition Home

## 2019-12-11 NOTE — Progress Notes (Signed)
Patient ID: Manuel Barker, male   DOB: 08-31-1950, 69 y.o.   MRN: [id number]    Progress Note   Subjective   Day # 2 CC post polypectomy bleed   HGb 10.7 this am stable   Colonoscopy 12/7- clips placed on all previously removed polyp sites , slight oozing from one of the transverse colon polyp sites, one additional 5 mm polyp removed and hemoclipped    troponin x 2 last pm- negative  Patient states he is feeling well. No blood per rectum since the procedure, tolerating liquids well. Wants to eat. No complaints at this time otherwise.    Objective   Vital signs in last 24 hours: Temp:  [97.7 F (36.5 C)-98.3 F (36.8 C)] 98.1 F (36.7 C) (12/08 0700) Pulse Rate:  [71-80] 79 (12/08 0700) Resp:  [15-20] 20 (12/08 0700) BP: (93-125)/(56-84) 107/73 (12/08 0700) SpO2:  [97 %-100 %] 99 % (12/08 0700) Last BM Date: 12/10/19 General:     AA male  in NAD Neurologic:  Alert and oriented,  grossly normal neurologically. Psych:  Cooperative. Normal mood and affect.  Intake/Output from previous day: 12/07 0701 - 12/08 0700 In: 1490 [I.V.:1490] Out: -  Intake/Output this shift: No intake/output data recorded.  Lab Results: Recent Labs    12/10/19 1705 12/11/19 0032 12/11/19 0605  WBC 5.3 5.3 6.3  HGB 9.0* 10.7* 10.7*  HCT 28.5* 32.7* 32.9*  PLT 138* 160 166   BMET Recent Labs    12/10/19 0618 12/10/19 0803 12/11/19 0032  NA 144 140 140  K 2.3* 3.9 3.6  CL 123* 110 109  CO2 16* 24 23  GLUCOSE 62* 89 91  BUN 8 11 9   CREATININE 0.48* 0.76 0.87  CALCIUM 5.0* 7.9* 8.6*   LFT Recent Labs    12/10/19 0618  PROT 3.6*  ALBUMIN 2.1*  AST 13*  ALT 16  ALKPHOS 32*  BILITOT 0.6   PT/INR No results for input(s): LABPROT, INR in the last 72 hours.  Studies/Results: No results found.     Assessment / Plan:    69 y/o male with history of CAD on aspirin and plavix, who had a colonoscopy on 12/1 with 52mm adenoma removed via hot snare in the ascending colon, and  other small polyps removed in the transverse colon, who was admitted with a postpolypectomy bleed on 12/7 in the setting of Plavix use.   Colonoscopy done yesterday, multiple polypectomy sites noted, no active bleeding, all sites treated with hemostasis clips, another small polyp removed. He has not had any further blood per rectum, tolerating liquid diet. Hgb stable at 10.7.  Will plan on advancing his diet to regular today and okay for discharge home. Recommend he hold Plavix for another 5 days and then resume, okay to continue baby aspirin at this time. Risk of rebleeding is low moving forward, but he should contact us if he notes this. Path on largest polyp removed is adenomatous, he will likely need a follow up colonoscopy in 3 years for surveillance. Will plan for a Hgb next week to ensure stable. All questions answered, he agreed with the plan.  [place] Cellar, MD [facility]

## 2019-12-11 NOTE — Progress Notes (Signed)
Patient discharging home.  IV removed - WNL.  Reviewed AVS and medications, instructed to resume plavix on 12/13 per MD orders and to follow up with PCP and cardio.  Patient verbalizes understanding, no questions at this time.  Awaiting arrival of ride, patient in NAD at this time

## 2019-12-12 ENCOUNTER — Telehealth: Payer: Self-pay

## 2019-12-12 DIAGNOSIS — H10413 Chronic giant papillary conjunctivitis, bilateral: Secondary | ICD-10-CM | POA: Diagnosis not present

## 2019-12-12 DIAGNOSIS — H40013 Open angle with borderline findings, low risk, bilateral: Secondary | ICD-10-CM | POA: Diagnosis not present

## 2019-12-12 DIAGNOSIS — H2513 Age-related nuclear cataract, bilateral: Secondary | ICD-10-CM | POA: Diagnosis not present

## 2019-12-12 DIAGNOSIS — H53021 Refractive amblyopia, right eye: Secondary | ICD-10-CM | POA: Diagnosis not present

## 2019-12-12 NOTE — Telephone Encounter (Signed)
Called the patient home number. No answer. Left information on the voicemail and my name and contact information for any questions or concerns. Lab order is in [place].

## 2019-12-12 NOTE — Telephone Encounter (Signed)
-----   Message from Alfredia Ferguson, PA-C sent at 12/12/2019 10:02 AM EST ----- Regarding: RE: foolow up labs Labs should be with [place] initially , as we are managing  his post polypectomy  bleed . Put under Dr Fuller Plan. ----- Message ----- From: Greggory Keen, LPN Sent: 30/01/6008   4:08 PM EST To: Alfredia Ferguson, PA-C Subject: RE: foolow up labs                             This happens a lot. The patient said his discharge instructions tell him to go to see his PCP and have labs in a week as well. He wants to know which is he supposed to do. ----- Message ----- From: Alfredia Ferguson, PA-C Sent: 12/11/2019  10:08 AM EST To: Greggory Keen, LPN Subject: foolow up labs                                 Please have pt come to office lab on Monday  Dec 13 ,for CBC - he is s/p polypectomy bleed Please call him later today to let him know  Put order under DR Fuller Plan  Thanks!

## 2019-12-13 ENCOUNTER — Encounter (HOSPITAL_COMMUNITY): Payer: Self-pay | Admitting: Gastroenterology

## 2019-12-16 ENCOUNTER — Other Ambulatory Visit (INDEPENDENT_AMBULATORY_CARE_PROVIDER_SITE_OTHER): Payer: BC Managed Care – PPO

## 2019-12-16 ENCOUNTER — Other Ambulatory Visit: Payer: Self-pay

## 2019-12-16 DIAGNOSIS — K625 Hemorrhage of anus and rectum: Secondary | ICD-10-CM | POA: Diagnosis not present

## 2019-12-16 LAB — CBC WITH DIFFERENTIAL/PLATELET
Basophils Absolute: 0 10*3/uL (ref 0.0–0.1)
Basophils Relative: 0.8 % (ref 0.0–3.0)
Eosinophils Absolute: 0.2 10*3/uL (ref 0.0–0.7)
Eosinophils Relative: 3.1 % (ref 0.0–5.0)
HCT: 35.1 % — ABNORMAL LOW (ref 39.0–52.0)
Hemoglobin: 11.7 g/dL — ABNORMAL LOW (ref 13.0–17.0)
Lymphocytes Relative: 17.9 % (ref 12.0–46.0)
Lymphs Abs: 1 10*3/uL (ref 0.7–4.0)
MCHC: 33.2 g/dL (ref 30.0–36.0)
MCV: 86.2 fl (ref 78.0–100.0)
Monocytes Absolute: 0.5 10*3/uL (ref 0.1–1.0)
Monocytes Relative: 8.3 % (ref 3.0–12.0)
Neutro Abs: 3.9 10*3/uL (ref 1.4–7.7)
Neutrophils Relative %: 69.9 % (ref 43.0–77.0)
Platelets: 237 10*3/uL (ref 150.0–400.0)
RBC: 4.07 Mil/uL — ABNORMAL LOW (ref 4.22–5.81)
RDW: 15.3 % (ref 11.5–15.5)
WBC: 5.5 10*3/uL (ref 4.0–10.5)

## 2019-12-18 ENCOUNTER — Other Ambulatory Visit: Payer: Self-pay

## 2019-12-18 ENCOUNTER — Encounter: Payer: Self-pay | Admitting: Internal Medicine

## 2019-12-18 ENCOUNTER — Ambulatory Visit (INDEPENDENT_AMBULATORY_CARE_PROVIDER_SITE_OTHER): Payer: BC Managed Care – PPO | Admitting: Internal Medicine

## 2019-12-18 VITALS — BP 100/68 | HR 66 | Temp 98.2°F | Wt 206.3 lb

## 2019-12-18 DIAGNOSIS — Z23 Encounter for immunization: Secondary | ICD-10-CM

## 2019-12-18 DIAGNOSIS — K922 Gastrointestinal hemorrhage, unspecified: Secondary | ICD-10-CM

## 2019-12-18 DIAGNOSIS — Z09 Encounter for follow-up examination after completed treatment for conditions other than malignant neoplasm: Secondary | ICD-10-CM

## 2019-12-18 DIAGNOSIS — I2510 Atherosclerotic heart disease of native coronary artery without angina pectoris: Secondary | ICD-10-CM

## 2019-12-18 DIAGNOSIS — Z9889 Other specified postprocedural states: Secondary | ICD-10-CM | POA: Diagnosis not present

## 2019-12-18 NOTE — Patient Instructions (Signed)
-  Nice seeing you today!!  -Second shingles vaccine today.  -See you back in 6 months or sooner as needed.

## 2019-12-18 NOTE — Addendum Note (Signed)
Addended by: Westley Hummer B on: 12/18/2019 05:16 PM   Modules accepted: Orders
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
## 2020-01-30 ENCOUNTER — Other Ambulatory Visit (INDEPENDENT_AMBULATORY_CARE_PROVIDER_SITE_OTHER): Payer: BC Managed Care – PPO

## 2020-01-30 ENCOUNTER — Encounter: Payer: Self-pay | Admitting: Gastroenterology

## 2020-01-30 ENCOUNTER — Ambulatory Visit (INDEPENDENT_AMBULATORY_CARE_PROVIDER_SITE_OTHER): Payer: BC Managed Care – PPO | Admitting: Gastroenterology

## 2020-01-30 VITALS — BP 106/60 | HR 71 | Ht 66.0 in | Wt 202.0 lb

## 2020-01-30 DIAGNOSIS — D62 Acute posthemorrhagic anemia: Secondary | ICD-10-CM | POA: Diagnosis not present

## 2020-01-30 DIAGNOSIS — K921 Melena: Secondary | ICD-10-CM

## 2020-01-30 DIAGNOSIS — Z86010 Personal history of colonic polyps: Secondary | ICD-10-CM

## 2020-01-30 DIAGNOSIS — K5900 Constipation, unspecified: Secondary | ICD-10-CM | POA: Diagnosis not present

## 2020-01-30 DIAGNOSIS — K625 Hemorrhage of anus and rectum: Secondary | ICD-10-CM

## 2020-01-30 LAB — CBC WITH DIFFERENTIAL/PLATELET
Basophils Absolute: 0.1 10*3/uL (ref 0.0–0.1)
Basophils Relative: 1.2 % (ref 0.0–3.0)
Eosinophils Absolute: 0.1 10*3/uL (ref 0.0–0.7)
Eosinophils Relative: 1.4 % (ref 0.0–5.0)
HCT: 41.6 % (ref 39.0–52.0)
Hemoglobin: 13.8 g/dL (ref 13.0–17.0)
Lymphocytes Relative: 20.1 % (ref 12.0–46.0)
Lymphs Abs: 1.2 10*3/uL (ref 0.7–4.0)
MCHC: 33.1 g/dL (ref 30.0–36.0)
MCV: 86.2 fl (ref 78.0–100.0)
Monocytes Absolute: 0.6 10*3/uL (ref 0.1–1.0)
Monocytes Relative: 9.5 % (ref 3.0–12.0)
Neutro Abs: 4.2 10*3/uL (ref 1.4–7.7)
Neutrophils Relative %: 67.8 % (ref 43.0–77.0)
Platelets: 225 10*3/uL (ref 150.0–400.0)
RBC: 4.83 Mil/uL (ref 4.22–5.81)
RDW: 14.8 % (ref 11.5–15.5)
WBC: 6.2 10*3/uL (ref 4.0–10.5)

## 2020-01-30 NOTE — Patient Instructions (Signed)
Your provider has requested that you go to the basement level for lab work before leaving today. Press "B" on the elevator. The lab is located at the first door on the left as you exit the elevator.  Increase water intake.  You have been given a high fiber diet.  Start over the [employer] daily. If no help in symptoms then start Colace daily. If still no improvement in symptoms then start Miralax daily.   If you see no help with these above regimens, contact our office.    Due to recent changes in healthcare laws, you may see the results of your imaging and laboratory studies on MyChart before your provider has had a chance to review them.  We understand that in some cases there may be results that are confusing or concerning to you. Not all laboratory results come back in the same time frame and the provider may be waiting for multiple results in order to interpret others.  Please give [place] 48 hours in order for your provider to thoroughly review all the results before contacting the office for clarification of your results.   Thank you for choosing me and [facility].  Pricilla Riffle. Dagoberto Ligas., MD., Marval Regal

## 2020-01-30 NOTE — Progress Notes (Signed)
    History of Present Illness: This is a 70 year old male returning for follow-up after a post polypectomy bleed. He underwent colonoscopy with multiple polypectomies on December 1 and Plavix was restarted. He presented to the hospital on December 6 with rectal bleeding. He underwent colonoscopy with clipping of polypectomy sites on December 7. He was discharged home on December 8. He has noted no further rectal bleeding. He has noted mild constipation over the past few weeks. He has taken Ducolax and enemas. His diet has changed as his wife has been away for the past month and he feels this could be causing constipation. Hgb has improved from 9.0 to 13.8 today.   Current Medications, Allergies, Past Medical History, Past Surgical History, Family History and Social History were reviewed in [employer] record.   Physical Exam: General: Well developed, well nourished, no acute distress Head: Normocephalic and atraumatic Eyes:  sclerae anicteric, EOMI Ears: Normal auditory acuity Mouth: Not examined, mask on during Covid-19 pandemic Lungs: Clear throughout to auscultation Heart: Regular rate and rhythm; no murmurs, rubs or bruits Abdomen: Soft, non tender and non distended. No masses, hepatosplenomegaly or hernias noted. Normal Bowel sounds Rectal: Not done Musculoskeletal: Symmetrical with no gross deformities  Pulses:  Normal pulses noted Extremities: No clubbing, cyanosis, edema or deformities noted Neurological: Alert oriented x 4, grossly nonfocal Psychological:  Alert and cooperative. Normal mood and affect   Assessment and Recommendations:  1. Post polypectomy bleed resolved.  2. ABL anemia, resolved.  CBC today with Hgb=13.8.  3. History of 12 mm and 5 mm adenomatous colon polyps removed in December 2021. 2 small hyperplastic polyps removed as well. History of a sessile serrated polyp on colonoscopy in 2016. A 3-year interval surveillance colonoscopy is  recommended in December 2024.  4. Constipation.  High-fiber diet.  Increase daily water intake.  Begin Benefiber once daily.  If this does not correct his constipation begin Colace once daily and if constipation still not corrected with MiraLAX once daily.  If these measures fail to address his constipation adequately he is advised to call.  5. CAD with stent maintained on Plavix and aspirin.

## 2020-02-05 ENCOUNTER — Other Ambulatory Visit: Payer: Self-pay | Admitting: Internal Medicine

## 2020-02-13 ENCOUNTER — Other Ambulatory Visit: Payer: Self-pay | Admitting: Internal Medicine

## 2020-02-29 ENCOUNTER — Ambulatory Visit
Admission: RE | Admit: 2020-02-29 | Discharge: 2020-02-29 | Disposition: A | Discharge status: Home / Self Care | Payer: BC Managed Care – PPO | Source: Ambulatory Visit | Attending: Pulmonary Disease | Admitting: Pulmonary Disease

## 2020-02-29 ENCOUNTER — Other Ambulatory Visit: Payer: Self-pay

## 2020-02-29 DIAGNOSIS — R911 Solitary pulmonary nodule: Secondary | ICD-10-CM

## 2020-02-29 DIAGNOSIS — J9811 Atelectasis: Secondary | ICD-10-CM | POA: Diagnosis not present

## 2020-02-29 DIAGNOSIS — I700 Atherosclerosis of aorta: Secondary | ICD-10-CM | POA: Diagnosis not present

## 2020-02-29 DIAGNOSIS — I2510 Atherosclerotic heart disease of native coronary artery without angina pectoris: Secondary | ICD-10-CM | POA: Diagnosis not present

## 2020-02-29 DIAGNOSIS — J984 Other disorders of lung: Secondary | ICD-10-CM | POA: Diagnosis not present

## 2020-03-02 ENCOUNTER — Telehealth: Payer: Self-pay | Admitting: Pulmonary Disease

## 2020-03-02 NOTE — Telephone Encounter (Signed)
I have reviewed the results of the ct with the pt per BPM.    CT chest from February/2022 shows stable 2.3 cm subsolid nodule. Favored to be benign nodular scarring. We will plan on repeat CT chest in February/2023. Patient should keep upcoming follow-up visit with Dr. Vaughan Browner in March/2022 to further review.  Wyn Quaker, FNP  Pt voiced his understanding.

## 2020-03-17 ENCOUNTER — Encounter: Payer: Self-pay | Admitting: Pulmonary Disease

## 2020-03-17 ENCOUNTER — Other Ambulatory Visit: Payer: Self-pay

## 2020-03-17 ENCOUNTER — Ambulatory Visit (INDEPENDENT_AMBULATORY_CARE_PROVIDER_SITE_OTHER): Payer: BC Managed Care – PPO | Admitting: Pulmonary Disease

## 2020-03-17 VITALS — BP 134/72 | HR 72 | Temp 98.0°F | Ht 66.0 in | Wt 202.8 lb

## 2020-03-17 DIAGNOSIS — Z9989 Dependence on other enabling machines and devices: Secondary | ICD-10-CM

## 2020-03-17 DIAGNOSIS — G4733 Obstructive sleep apnea (adult) (pediatric): Secondary | ICD-10-CM

## 2020-03-17 DIAGNOSIS — R911 Solitary pulmonary nodule: Secondary | ICD-10-CM

## 2020-03-17 NOTE — Progress Notes (Signed)
Manuel Barker    [id number]    1950-08-28  Primary Care Physician:Manuel [name], MD  Referring Physician: Isaac Barker, Manuel Halsted, MD [place],  [place] [address]  Chief complaint: Follow up for  Lung nodule Sleep apnea  HPI: 70 year old with history of hypertension, hyperlipidemia, OSA, prostate cancer.  He has an incidental finding of right lower lobe lung nodule found on CT scan of the abdomen in July 2018 which is being followed in the pulmonary clinic.    H/O prostate cancer in November 2017.  S/p proton therapy.  PSA has been low on follow-up surveillance.    Also has history of severe OSA.  Had a repeat sleep apnea test in 2019 which redemonstrated severe OSA.  Maintained on CPAP.   Discussed at multidisciplinary conference in August 2021 and per their recommendation underwent bronchoscopy on 08/23/2019 by Dr. Valeta Harms for evaluation of right lower lobe lung nodule that had minimally increased in size.  Path report shows no malignancy.  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  No mold exposure Smoking history:  no smoking history Travel History: No recent travel history  Interim history: He is here for review of follow-up CT.  States that breathing is doing well with chronic dyspnea on exertion.  No new change  Outpatient Encounter Medications as of 03/17/2020  Medication Sig  . amLODipine (NORVASC) 5 MG tablet TAKE 1 TABLET(5 MG) BY MOUTH DAILY  . aspirin 81 MG tablet Take 1 tablet (81 mg total) by mouth daily.  . clopidogrel (PLAVIX) 75 MG tablet Take 1 tablet (75 mg total) by mouth daily.  Marland Kitchen ezetimibe (ZETIA) 10 MG tablet TAKE 1 TABLET(10 MG) BY MOUTH DAILY  . metoprolol succinate (TOPROL-XL) 25 MG 24 hr tablet TAKE 1 TABLET(25 MG) BY MOUTH DAILY  . nitroGLYCERIN (NITROSTAT) 0.4 MG SL tablet PLACE 1 TABLET UNDER THE TONGUE IF NEEDED EVERY 5 MINUTES FOR CHEST PAIN FOR  3 DOSES, IF NO RELIEF AFTER FIRST DOSE CALL 911 (Patient taking differently: Place 0.4 mg under the tongue every 5 (five) minutes as needed for chest pain.)  . rosuvastatin (CRESTOR) 40 MG tablet Take 1 tablet (40 mg total) by mouth at bedtime.  Marland Kitchen telmisartan (MICARDIS) 40 MG tablet Take 1 tablet (40 mg total) by mouth daily.   Facility-Administered Encounter Medications as of 03/17/2020  Medication  . sodium phosphate (FLEET) 7-19 GM/118ML enema 1 enema   Physical Exam: Blood pressure 134/72, pulse 72, temperature 98 F (36.7 C), temperature source Temporal, height 5\' 6"  (1.676 m), weight 202 lb 12.8 oz (92 kg), SpO2 98 %. Gen:      No acute distress HEENT:  EOMI, sclera anicteric Neck:     No masses; no thyromegaly Lungs:    Clear to auscultation bilaterally; normal respiratory effort CV:         Regular rate and rhythm; no murmurs Abd:      + bowel sounds; soft, non-tender; no palpable masses, no distension Ext:    No edema; adequate peripheral perfusion Skin:      Warm and dry; no rash Neuro: alert and oriented x 3 Psych: normal mood and affect  Data Reviewed: Imaging: CT abdomen pelvis 08/01/16-13 mm right lower lobe pulmonary nodule.  No lymphadenopathy, 8 mm lesion in the right kidney. PET scan 11/07/16-.  Low-grade in right lower lobe pulmonary nodule CT chest 06/01/2017-stable right lower lobe lung  nodule CT chest 07/24/2018- stable right lower lobe lung nodule. CT chest 07/23/2019-right lower lobe lung nodule stable/minimally increased in size. PET scan 08/15/2019-low-grade activity in the right lower lobe nodule, SUV 1.9 CT chest 02/29/2020-stable right lower lobe lung nodule unchanged from 2018. I reviewed all images personally  Labs 10/26/16 ANA, ANCA, CCP, rheumatoid factor-negative Sed rate- 8, CRP 0.1 ACE levels pending  Sleep PSG 07/03/2017- severe OSA, AHI 53.3.  Titrated to CPAP at 15 cm CPAP download 5/14-8/11/20: 93% compliance.  Set pressure 13 cm, residual AHI  14.9  Pathology 08/20/2019-benign lung tissue with no malignancy  Assessment:  Severe OSA Tolerating CPAP with no issues. Download reviewed with good compliance  Lung nodule.  Underwent biopsy due to minimal change in size the PET study showed low-grade activity Status reassuring and follow-up CT last month shows stability.  This is likely benign Follow-up CT ordered for 1 year.  Plan/Recommendations: - Continue CPAP.  - Follow-up CT in February 2023  Manuel Garfinkel MD [facility] 03/17/2020, 9:30 AM  CC: Manuel Barker, Manuel Barker*

## 2020-03-17 NOTE — Patient Instructions (Signed)
I have reviewed the CT which shows stable lung nodule We will get a follow-up CT without contrast in February 2023 for lung nodule  Return to clinic after CT scan

## 2020-03-17 NOTE — Addendum Note (Signed)
Addended by: Elton Sin on: 03/17/2020 09:47 AM   Modules accepted: Orders

## 2020-03-20 ENCOUNTER — Other Ambulatory Visit: Payer: Self-pay | Admitting: Internal Medicine

## 2020-03-20 DIAGNOSIS — I10 Essential (primary) hypertension: Secondary | ICD-10-CM
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
## 2020-03-24 ENCOUNTER — Ambulatory Visit: Payer: BC Managed Care – PPO | Admitting: Internal Medicine

## 2020-03-30 ENCOUNTER — Other Ambulatory Visit: Payer: Self-pay

## 2020-03-31 ENCOUNTER — Encounter: Payer: Self-pay | Admitting: Internal Medicine

## 2020-03-31 ENCOUNTER — Ambulatory Visit (INDEPENDENT_AMBULATORY_CARE_PROVIDER_SITE_OTHER): Payer: BC Managed Care – PPO | Admitting: Internal Medicine

## 2020-03-31 ENCOUNTER — Other Ambulatory Visit: Payer: Self-pay | Admitting: Internal Medicine

## 2020-03-31 VITALS — BP 110/70 | HR 66 | Temp 98.4°F | Wt 198.6 lb

## 2020-03-31 DIAGNOSIS — E785 Hyperlipidemia, unspecified: Secondary | ICD-10-CM | POA: Diagnosis not present

## 2020-03-31 DIAGNOSIS — E559 Vitamin D deficiency, unspecified: Secondary | ICD-10-CM

## 2020-03-31 DIAGNOSIS — R739 Hyperglycemia, unspecified: Secondary | ICD-10-CM | POA: Diagnosis not present

## 2020-03-31 DIAGNOSIS — R911 Solitary pulmonary nodule: Secondary | ICD-10-CM

## 2020-03-31 DIAGNOSIS — R7302 Impaired glucose tolerance (oral): Secondary | ICD-10-CM | POA: Diagnosis not present

## 2020-03-31 DIAGNOSIS — C61 Malignant neoplasm of prostate: Secondary | ICD-10-CM

## 2020-03-31 LAB — POCT GLYCOSYLATED HEMOGLOBIN (HGB A1C): Hemoglobin A1C: 6.1 % — AB (ref 4.0–5.6)

## 2020-03-31 LAB — VITAMIN D 25 HYDROXY (VIT D DEFICIENCY, FRACTURES): VITD: 19.98 ng/mL — ABNORMAL LOW (ref 30.00–100.00)

## 2020-03-31 MED ORDER — VITAMIN D (ERGOCALCIFEROL) 1.25 MG (50000 UNIT) PO CAPS
50000.0000 [IU] | ORAL_CAPSULE | ORAL | 0 refills | Status: DC
Start: 2020-03-31 — End: 2020-07-07

## 2020-03-31 NOTE — Patient Instructions (Signed)
-  Nice seeing you today!!  -Lab work today; will notify you once results are available.  -Schedule follow up in 6 months for your physical. Please come in fasting that day. 
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
## 2020-04-15 ENCOUNTER — Encounter: Payer: Self-pay | Admitting: Internal Medicine

## 2020-04-15 ENCOUNTER — Other Ambulatory Visit: Payer: Self-pay

## 2020-04-15 ENCOUNTER — Ambulatory Visit (INDEPENDENT_AMBULATORY_CARE_PROVIDER_SITE_OTHER): Payer: BC Managed Care – PPO | Admitting: Internal Medicine

## 2020-04-15 VITALS — BP 110/82 | HR 65 | Ht 66.0 in | Wt 200.0 lb

## 2020-04-15 DIAGNOSIS — I10 Essential (primary) hypertension: Secondary | ICD-10-CM

## 2020-04-15 DIAGNOSIS — R7309 Other abnormal glucose: Secondary | ICD-10-CM

## 2020-04-15 DIAGNOSIS — I2510 Atherosclerotic heart disease of native coronary artery without angina pectoris: Secondary | ICD-10-CM | POA: Diagnosis not present

## 2020-04-15 NOTE — Patient Instructions (Signed)
Medication Instructions:  No changes *If you need a refill on your cardiac medications before your next appointment, please call your pharmacy*   Lab Work: Please return in July for blood work --BMET, CBC, HgA1c   Testing/Procedures: none  Follow-Up: At [facility], you and your health needs are our priority.  As part of our continuing mission to provide you with exceptional heart care, we have created designated Provider Care Teams.  These Care Teams include your primary Cardiologist (physician) and Advanced Practice Providers (APPs -  Physician Assistants and Nurse Practitioners) who all work together to provide you with the care you need, when you need it.   Your next appointment:   6 month(s)  The format for your next appointment:   In Person  Provider:   You may see [name], MD or one of the following Advanced Practice Providers on your designated Care Team:    Richardson Dopp, PA-C  Robbie Lis, [place]   Other Instructions

## 2020-04-16 ENCOUNTER — Other Ambulatory Visit: Payer: Self-pay | Admitting: Physician Assistant

## 2020-05-20 ENCOUNTER — Other Ambulatory Visit: Payer: Self-pay | Admitting: Internal Medicine

## 2020-05-21 ENCOUNTER — Ambulatory Visit: Payer: Medicare Other | Attending: Family

## 2020-05-21 DIAGNOSIS — Z23 Encounter for immunization: Secondary | ICD-10-CM

## 2020-05-21 NOTE — Progress Notes (Signed)
   Covid-19 Vaccination Clinic  Name:  Manuel Barker    MRN: [id number] DOB: 08/13/1950  05/21/2020  [name] was observed post Covid-19 immunization for 15 minutes without incident. He was provided with Vaccine Information Sheet and instruction to access the V-Safe system.   [name] was instructed to call 911 with any severe reactions post vaccine: Marland Kitchen Difficulty breathing  . Swelling of face and throat  . A fast heartbeat  . A bad rash all over body  . Dizziness and weakness   Immunizations Administered    Name Date Dose VIS Date Route   Moderna Covid-19 Booster Vaccine 05/21/2020  2:24 PM 0.25 mL 10/23/2019 Intramuscular   Manufacturer: Moderna   Lot: [id number]   [place]: [id number]

## 2020-07-05 ENCOUNTER — Other Ambulatory Visit: Payer: Self-pay | Admitting: Internal Medicine

## 2020-07-05 DIAGNOSIS — E559 Vitamin D deficiency, unspecified: Secondary | ICD-10-CM

## 2020-07-20 ENCOUNTER — Other Ambulatory Visit: Payer: BC Managed Care – PPO | Admitting: *Deleted

## 2020-07-20 ENCOUNTER — Other Ambulatory Visit: Payer: Self-pay

## 2020-07-20 DIAGNOSIS — R7309 Other abnormal glucose: Secondary | ICD-10-CM

## 2020-07-20 DIAGNOSIS — I10 Essential (primary) hypertension: Secondary | ICD-10-CM | POA: Diagnosis not present

## 2020-07-20 DIAGNOSIS — I2510 Atherosclerotic heart disease of native coronary artery without angina pectoris: Secondary | ICD-10-CM

## 2020-07-21 LAB — CBC
Hematocrit: 43.4 % (ref 37.5–51.0)
Hemoglobin: 14.3 g/dL (ref 13.0–17.7)
MCH: 28.4 pg (ref 26.6–33.0)
MCHC: 32.9 g/dL (ref 31.5–35.7)
MCV: 86 fL (ref 79–97)
Platelets: 210 10*3/uL (ref 150–450)
RBC: 5.04 x10E6/uL (ref 4.14–5.80)
RDW: 15 % (ref 11.6–15.4)
WBC: 5.3 10*3/uL (ref 3.4–10.8)

## 2020-07-21 LAB — BASIC METABOLIC PANEL
BUN/Creatinine Ratio: 13 (ref 10–24)
BUN: 13 mg/dL (ref 8–27)
CO2: 23 mmol/L (ref 20–29)
Calcium: 9 mg/dL (ref 8.6–10.2)
Chloride: 101 mmol/L (ref 96–106)
Creatinine, Ser: 1.02 mg/dL (ref 0.76–1.27)
Glucose: 84 mg/dL (ref 65–99)
Potassium: 3.7 mmol/L (ref 3.5–5.2)
Sodium: 138 mmol/L (ref 134–144)
eGFR: 80 mL/min/{1.73_m2} (ref >59)

## 2020-07-21 LAB — HEMOGLOBIN A1C
Est. average glucose Bld gHb Est-mCnc: 126 mg/dL
Hgb A1c MFr Bld: 6 % — ABNORMAL HIGH (ref 4.8–5.6)

## 2020-07-22 ENCOUNTER — Ambulatory Visit: Payer: BC Managed Care – PPO | Admitting: Internal Medicine

## 2020-07-22 ENCOUNTER — Other Ambulatory Visit: Payer: Self-pay

## 2020-07-22 VITALS — BP 120/70 | HR 79 | Temp 98.4°F | Wt 197.3 lb

## 2020-07-22 DIAGNOSIS — M545 Low back pain, unspecified: Secondary | ICD-10-CM | POA: Diagnosis not present

## 2020-07-22 NOTE — Patient Instructions (Signed)
-  Nice seeing you today!!  -For your back: Back stretches daily, icing for 20 min 3 times a day, ibuprofen 2 tablets three times a day for 5 days and then as needed. If not better in 1 week, please call us back for further instructions.
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
## 2020-08-18 ENCOUNTER — Ambulatory Visit: Payer: BC Managed Care – PPO | Admitting: Internal Medicine

## 2020-09-08 LAB — PSA: PSA: 0.34

## 2020-09-24 ENCOUNTER — Other Ambulatory Visit: Payer: Self-pay | Admitting: Internal Medicine

## 2020-10-07 ENCOUNTER — Other Ambulatory Visit: Payer: Self-pay | Admitting: Internal Medicine

## 2020-10-09 ENCOUNTER — Encounter: Payer: Self-pay | Admitting: Internal Medicine

## 2020-10-11 ENCOUNTER — Other Ambulatory Visit: Payer: Self-pay | Admitting: Internal Medicine

## 2020-10-11 DIAGNOSIS — E559 Vitamin D deficiency, unspecified: Secondary | ICD-10-CM

## 2020-10-20 ENCOUNTER — Other Ambulatory Visit: Payer: Self-pay | Admitting: Internal Medicine

## 2020-10-20 DIAGNOSIS — I10 Essential (primary) hypertension: Secondary | ICD-10-CM

## 2020-10-22 ENCOUNTER — Ambulatory Visit: Payer: BC Managed Care – PPO | Attending: Family

## 2020-10-22 DIAGNOSIS — Z23 Encounter for immunization: Secondary | ICD-10-CM

## 2020-10-22 NOTE — Progress Notes (Signed)
   Covid-19 Vaccination Clinic  Name:  GAITHER BIEHN    MRN: [id number] DOB: 07-20-1950  10/22/2020  Mr. Macek was observed post Covid-19 immunization for 15 minutes without incident. He was provided with Vaccine Information Sheet and instruction to access the V-Safe system.   Mr. Crom was instructed to call 911 with any severe reactions post vaccine: Difficulty breathing  Swelling of face and throat  A fast heartbeat  A bad rash all over body  Dizziness and weakness   Immunizations Administered     Name Date Dose VIS Date Route   Moderna Covid-19 vaccine Bivalent Booster 10/22/2020  2:15 PM 0.25 mL 08/15/2020 Intramuscular   Manufacturer: Moderna   Lot: [id number]   [place]: [id number]

## 2020-11-16 ENCOUNTER — Ambulatory Visit: Payer: BC Managed Care – PPO

## 2020-11-17 ENCOUNTER — Ambulatory Visit (INDEPENDENT_AMBULATORY_CARE_PROVIDER_SITE_OTHER): Payer: BC Managed Care – PPO

## 2020-11-17 DIAGNOSIS — Z23 Encounter for immunization: Secondary | ICD-10-CM

## 2020-11-18 ENCOUNTER — Ambulatory Visit: Payer: BC Managed Care – PPO

## 2020-11-19 ENCOUNTER — Other Ambulatory Visit: Payer: Self-pay | Admitting: Internal Medicine

## 2020-12-16 ENCOUNTER — Other Ambulatory Visit: Payer: Self-pay

## 2020-12-16 MED ORDER — ROSUVASTATIN CALCIUM 40 MG PO TABS: 40.0000 mg | ORAL_TABLET | Freq: Every day | ORAL | 1 refills | Status: DC

## 2020-12-19 ENCOUNTER — Other Ambulatory Visit: Payer: Self-pay | Admitting: Internal Medicine

## 2021-01-28 ENCOUNTER — Other Ambulatory Visit: Payer: Self-pay | Admitting: Internal Medicine

## 2021-02-03 DIAGNOSIS — H2513 Age-related nuclear cataract, bilateral: Secondary | ICD-10-CM | POA: Diagnosis not present

## 2021-02-03 DIAGNOSIS — H53021 Refractive amblyopia, right eye: Secondary | ICD-10-CM | POA: Diagnosis not present

## 2021-02-03 DIAGNOSIS — H40013 Open angle with borderline findings, low risk, bilateral: Secondary | ICD-10-CM | POA: Diagnosis not present

## 2021-02-03 DIAGNOSIS — H10413 Chronic giant papillary conjunctivitis, bilateral: Secondary | ICD-10-CM | POA: Diagnosis not present

## 2021-02-03 DIAGNOSIS — H5212 Myopia, left eye: Secondary | ICD-10-CM | POA: Diagnosis not present

## 2021-02-03 DIAGNOSIS — H5010 Unspecified exotropia: Secondary | ICD-10-CM | POA: Diagnosis not present

## 2021-02-03 DIAGNOSIS — H02401 Unspecified ptosis of right eyelid: Secondary | ICD-10-CM | POA: Diagnosis not present

## 2021-02-05 ENCOUNTER — Other Ambulatory Visit: Payer: BC Managed Care – PPO

## 2021-02-12 ENCOUNTER — Other Ambulatory Visit: Payer: BC Managed Care – PPO

## 2021-03-05 ENCOUNTER — Other Ambulatory Visit: Payer: Self-pay

## 2021-03-05 ENCOUNTER — Ambulatory Visit
Admission: RE | Admit: 2021-03-05 | Discharge: 2021-03-05 | Disposition: A | Discharge status: Home / Self Care | Payer: BC Managed Care – PPO | Source: Ambulatory Visit | Attending: Pulmonary Disease | Admitting: Pulmonary Disease

## 2021-03-05 DIAGNOSIS — R911 Solitary pulmonary nodule: Secondary | ICD-10-CM

## 2021-03-05 IMAGING — CT CT CHEST W/O CM
1 of 2 series · 15 of 32 positions shown, 19 images · non-contrast
Comparison: CT [DATE], PET CT [DATE]. Most remote chest CT
[DATE], PET CT [DATE]

CLINICAL DATA: Follow-up right lung nodule.



[Series 6: super d · axial · 0.75mm/px · z∈[-80,+202]mm · 15 of 395 slices shown, 19 images]
[im 21/395  mediastinal]
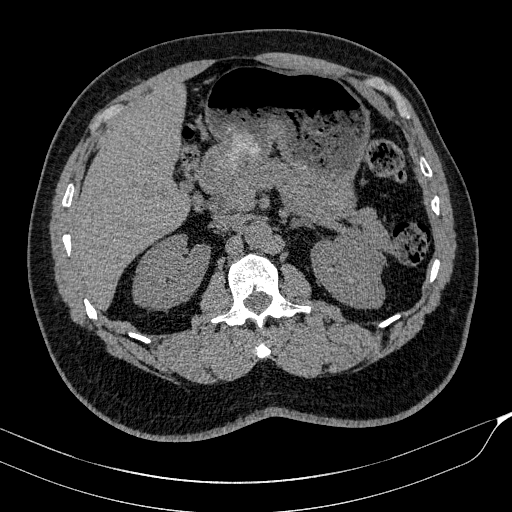
[im 21/395  lung]
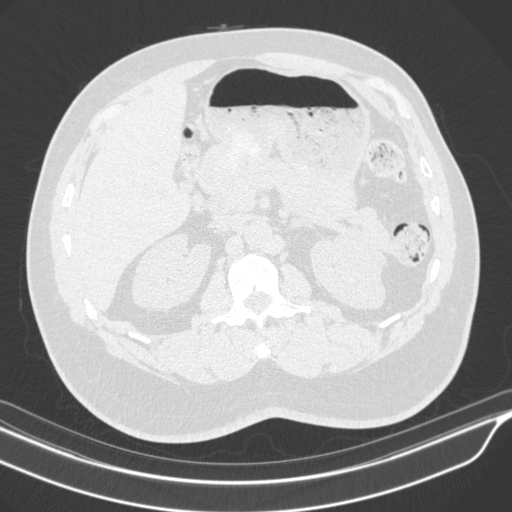
[im 63/395  lung]
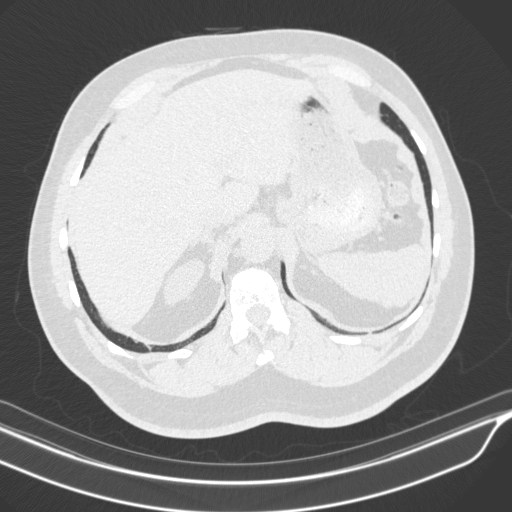
[im 83/395  lung]
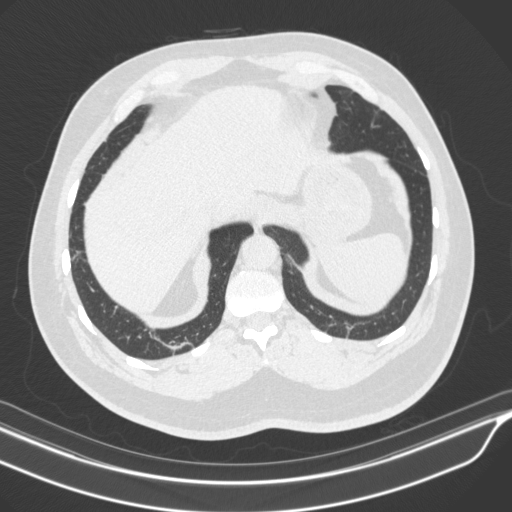
[im 104/395  lung]
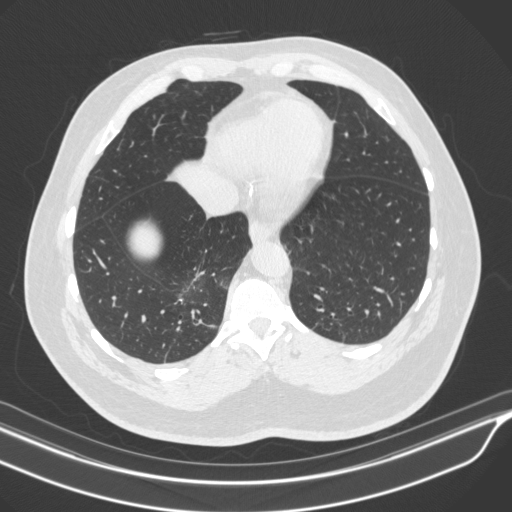
[im 132/395  mediastinal]
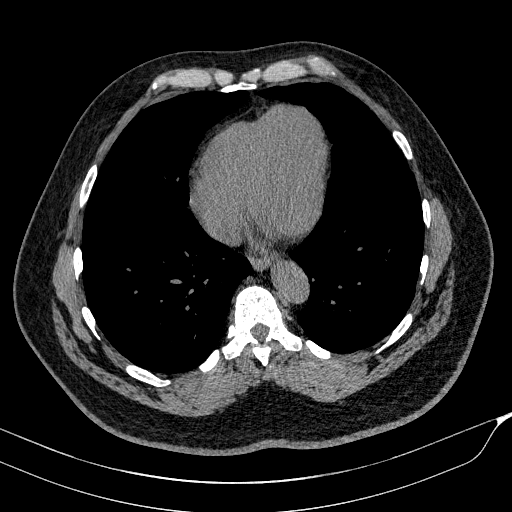
[im 132/395  lung]
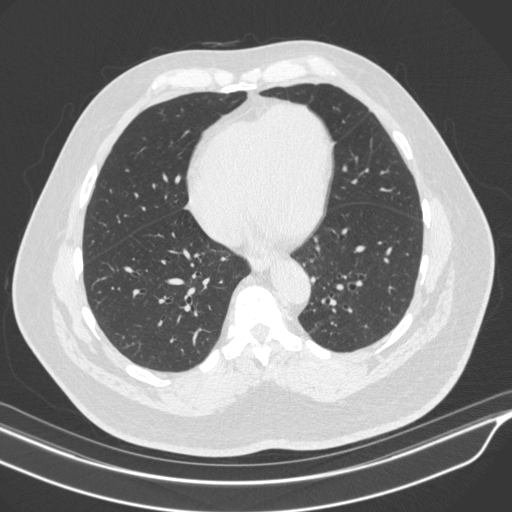
[im 146/395  lung]
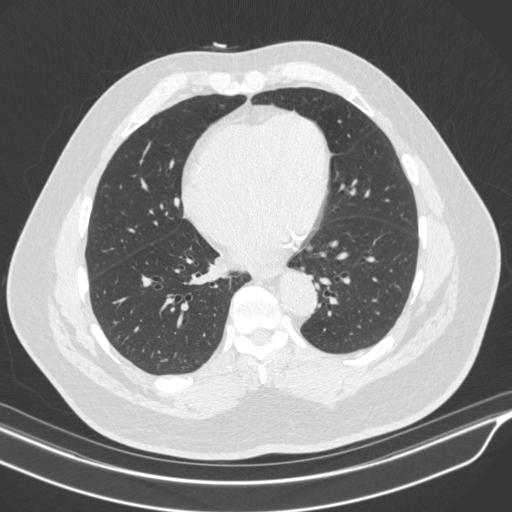
[im 186/395  lung]
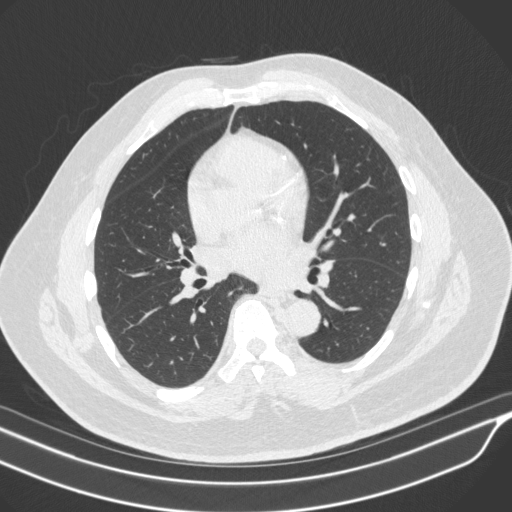
[im 187/395  lung]
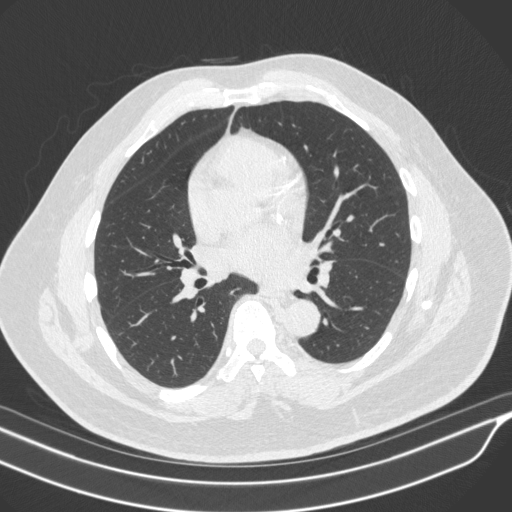
[im 208/395  mediastinal]
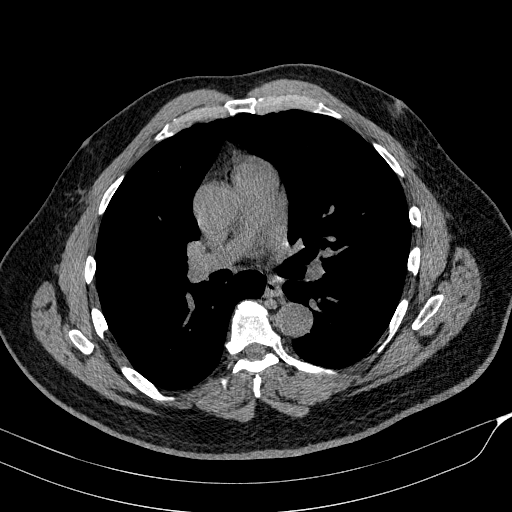
[im 208/395  lung]
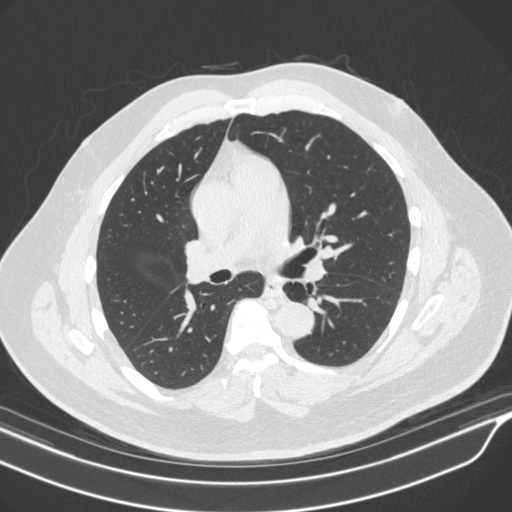
[im 249/395  lung]
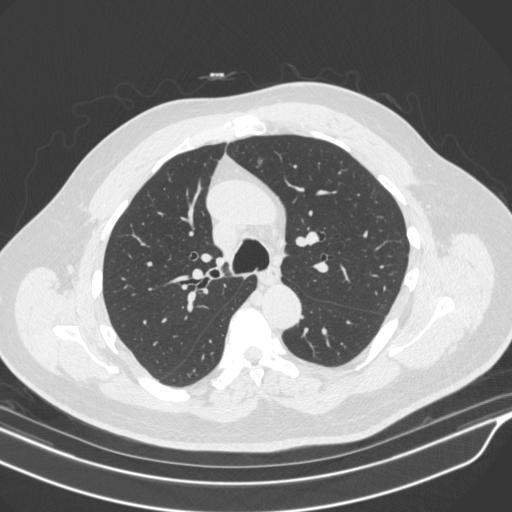
[im 263/395  lung]
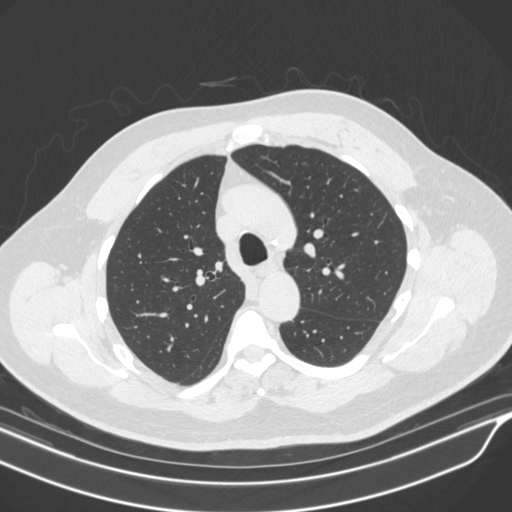
[im 291/395  lung]
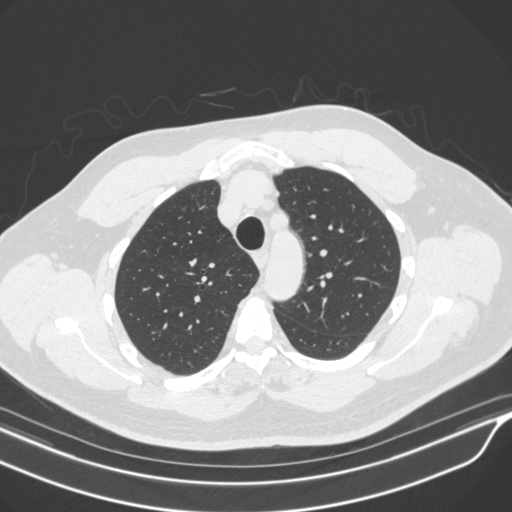
[im 312/395  mediastinal]
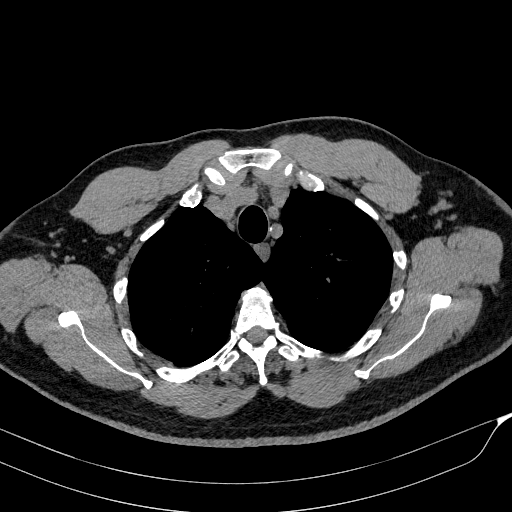
[im 312/395  lung]
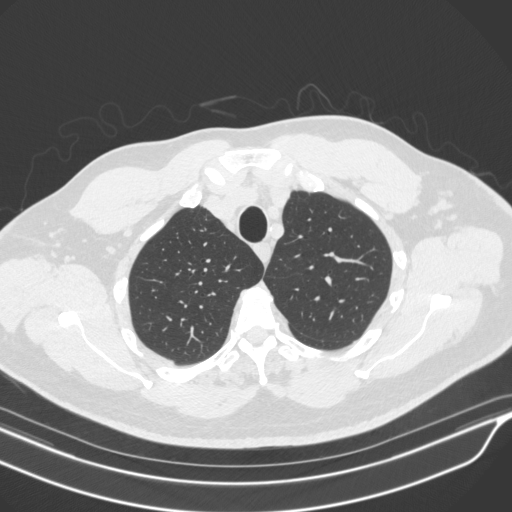
[im 332/395  lung]
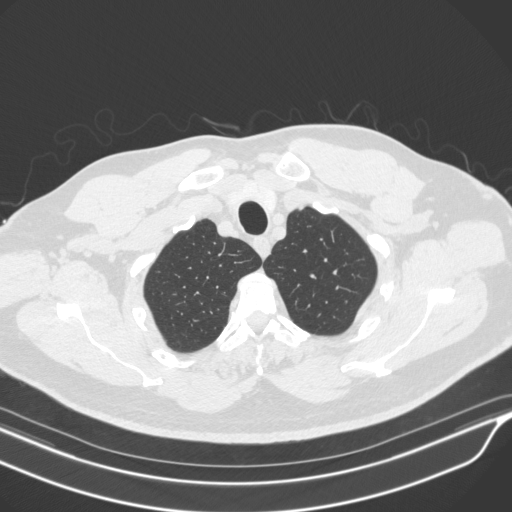
[im 374/395  lung]
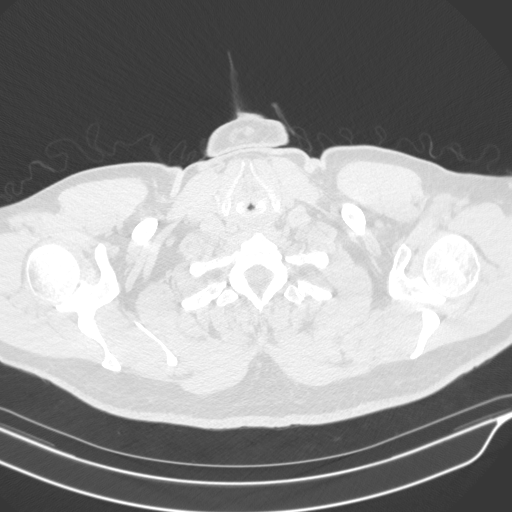

[15 of 32 positions shown; findings below may reference images not displayed]

FINDINGS: Cardiovascular: The thoracic aorta is normal in caliber with mild
atherosclerosis. The heart is normal in size. Coronary artery
calcifications/stents. No pericardial effusion.

Mediastinum/Nodes: No enlarged mediastinal lymph nodes. Limited
assessment for hilar adenopathy on this unenhanced exam, no bulky
hilar adenopathy. Decompressed esophagus. No visualized thyroid
nodule.

Lungs/Pleura: Sub solid nodule in the medial right lower lobe
measures approximately 2.1 x 1.3 cm with associated architectural
distortion, measured on series 5, image 115. Fiducial marker/clip
peripherally from prior biopsy. There is been no significant
interval change in appearance dating back to [XC] PET. There is mild
right lower lobe bronchiolectasis and adjacent linear subsegmental
atelectasis/scarring.

2-3 mm left upper lobe nodule series 5, image 55 and tiny subpleural
left upper lobe nodule series 5, image 50, not significantly changed
from prior and considered benign. No new or progressive pulmonary
nodule. No acute airspace disease. No pleural fluid. The trachea and
central bronchi are patent.

Upper Abdomen: No acute upper abdominal findings.

Musculoskeletal: No focal bone lesion or acute osseous
abnormalities. No chest wall soft tissue abnormalities.
IMPRESSION: 1. Sub solid right lower lobe pulmonary nodule measuring
approximately 2.1 x 1.3 cm with associated architectural distortion.
This is essentially stable in size from prior exams and favors
benign scarring.
2. No new or progressive pulmonary nodule.
3. Coronary artery calcifications/stents.

Aortic Atherosclerosis ([XC]-[XC]).

## 2021-03-19 ENCOUNTER — Other Ambulatory Visit: Payer: Self-pay | Admitting: Internal Medicine

## 2021-03-22 ENCOUNTER — Other Ambulatory Visit: Payer: Self-pay | Admitting: Internal Medicine

## 2021-03-22 NOTE — Telephone Encounter (Signed)
Pt called & notified that appt will have to be scheduled for future refills. Appt scheduled for April 25 to meet patient scheduling needs. ?

## 2021-04-19 LAB — PSA: PSA: 0.29

## 2021-04-19 LAB — TESTOSTERONE: Testosterone: 137

## 2021-04-20 ENCOUNTER — Encounter: Payer: Self-pay | Admitting: Internal Medicine

## 2021-04-22 DIAGNOSIS — Z20822 Contact with and (suspected) exposure to covid-19: Secondary | ICD-10-CM | POA: Diagnosis not present

## 2021-04-26 ENCOUNTER — Other Ambulatory Visit: Payer: Self-pay | Admitting: Internal Medicine

## 2021-04-26 DIAGNOSIS — I10 Essential (primary) hypertension: Secondary | ICD-10-CM

## 2021-04-27 ENCOUNTER — Encounter: Payer: Self-pay | Admitting: Internal Medicine

## 2021-04-27 ENCOUNTER — Other Ambulatory Visit: Payer: Self-pay | Admitting: Internal Medicine

## 2021-04-27 ENCOUNTER — Other Ambulatory Visit: Payer: Self-pay | Admitting: *Deleted

## 2021-04-27 ENCOUNTER — Ambulatory Visit (INDEPENDENT_AMBULATORY_CARE_PROVIDER_SITE_OTHER): Payer: BC Managed Care – PPO | Admitting: Internal Medicine

## 2021-04-27 VITALS — BP 120/78 | HR 76 | Temp 98.3°F | Ht 65.0 in | Wt 195.8 lb

## 2021-04-27 DIAGNOSIS — Z0000 Encounter for general adult medical examination without abnormal findings: Secondary | ICD-10-CM | POA: Diagnosis not present

## 2021-04-27 DIAGNOSIS — E559 Vitamin D deficiency, unspecified: Secondary | ICD-10-CM

## 2021-04-27 DIAGNOSIS — R300 Dysuria: Secondary | ICD-10-CM | POA: Diagnosis not present

## 2021-04-27 DIAGNOSIS — I10 Essential (primary) hypertension: Secondary | ICD-10-CM | POA: Diagnosis not present

## 2021-04-27 DIAGNOSIS — C61 Malignant neoplasm of prostate: Secondary | ICD-10-CM

## 2021-04-27 DIAGNOSIS — E785 Hyperlipidemia, unspecified: Secondary | ICD-10-CM | POA: Diagnosis not present

## 2021-04-27 DIAGNOSIS — R7302 Impaired glucose tolerance (oral): Secondary | ICD-10-CM | POA: Diagnosis not present

## 2021-04-27 LAB — COMPREHENSIVE METABOLIC PANEL
ALT: 26 U/L (ref 0–53)
AST: 23 U/L (ref 0–37)
Albumin: 4.3 g/dL (ref 3.5–5.2)
Alkaline Phosphatase: 67 U/L (ref 39–117)
BUN: 12 mg/dL (ref 6–23)
CO2: 28 mEq/L (ref 19–32)
Calcium: 9.3 mg/dL (ref 8.4–10.5)
Chloride: 104 mEq/L (ref 96–112)
Creatinine, Ser: 0.95 mg/dL (ref 0.40–1.50)
GFR: 81 mL/min (ref >60.00)
Glucose, Bld: 94 mg/dL (ref 70–99)
Potassium: 3.9 mEq/L (ref 3.5–5.1)
Sodium: 138 mEq/L (ref 135–145)
Total Bilirubin: 0.6 mg/dL (ref 0.2–1.2)
Total Protein: 7.5 g/dL (ref 6.0–8.3)

## 2021-04-27 LAB — POCT URINALYSIS DIPSTICK
Bilirubin, UA: NEGATIVE
Blood, UA: NEGATIVE
Glucose, UA: NEGATIVE
Ketones, UA: NEGATIVE
Leukocytes, UA: NEGATIVE
Nitrite, UA: NEGATIVE
Protein, UA: POSITIVE — AB
Spec Grav, UA: 1.02 (ref 1.010–1.025)
Urobilinogen, UA: 0.2 E.U./dL
pH, UA: 6 (ref 5.0–8.0)

## 2021-04-27 LAB — CBC WITH DIFFERENTIAL/PLATELET
Basophils Absolute: 0 10*3/uL (ref 0.0–0.1)
Basophils Relative: 0.8 % (ref 0.0–3.0)
Eosinophils Absolute: 0.1 10*3/uL (ref 0.0–0.7)
Eosinophils Relative: 1.5 % (ref 0.0–5.0)
HCT: 47.7 % (ref 39.0–52.0)
Hemoglobin: 15.5 g/dL (ref 13.0–17.0)
Lymphocytes Relative: 19.7 % (ref 12.0–46.0)
Lymphs Abs: 1.1 10*3/uL (ref 0.7–4.0)
MCHC: 32.5 g/dL (ref 30.0–36.0)
MCV: 88.2 fl (ref 78.0–100.0)
Monocytes Absolute: 0.5 10*3/uL (ref 0.1–1.0)
Monocytes Relative: 8.9 % (ref 3.0–12.0)
Neutro Abs: 3.8 10*3/uL (ref 1.4–7.7)
Neutrophils Relative %: 69.1 % (ref 43.0–77.0)
Platelets: 203 10*3/uL (ref 150.0–400.0)
RBC: 5.41 Mil/uL (ref 4.22–5.81)
RDW: 14.5 % (ref 11.5–15.5)
WBC: 5.5 10*3/uL (ref 4.0–10.5)

## 2021-04-27 LAB — LIPID PANEL
Cholesterol: 126 mg/dL (ref 0–200)
HDL: 45.7 mg/dL (ref >39.00)
LDL Cholesterol: 68 mg/dL (ref 0–99)
NonHDL: 80.79
Total CHOL/HDL Ratio: 3
Triglycerides: 64 mg/dL (ref 0.0–149.0)
VLDL: 12.8 mg/dL (ref 0.0–40.0)

## 2021-04-27 LAB — VITAMIN D 25 HYDROXY (VIT D DEFICIENCY, FRACTURES): VITD: 20.93 ng/mL — ABNORMAL LOW (ref 30.00–100.00)

## 2021-04-27 LAB — HEMOGLOBIN A1C: Hgb A1c MFr Bld: 6.3 % (ref 4.6–6.5)

## 2021-04-27 MED ORDER — VITAMIN D (ERGOCALCIFEROL) 1.25 MG (50000 UNIT) PO CAPS: ORAL_CAPSULE | ORAL | 0 refills | Status: DC
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
## 2021-04-27 NOTE — Patient Instructions (Signed)
-  Nice seeing you today!! ? ?-Lab work today; will notify you once results are available. ? ?-See you back in 1 year or sooner as needed. ? ? ?

## 2021-05-11 DIAGNOSIS — Z20822 Contact with and (suspected) exposure to covid-19: Secondary | ICD-10-CM | POA: Diagnosis not present

## 2021-05-14 ENCOUNTER — Other Ambulatory Visit: Payer: Self-pay | Admitting: Internal Medicine

## 2021-06-01 ENCOUNTER — Other Ambulatory Visit: Payer: Self-pay | Admitting: Internal Medicine

## 2021-06-17 ENCOUNTER — Other Ambulatory Visit: Payer: Self-pay

## 2021-06-17 MED ORDER — ROSUVASTATIN CALCIUM 40 MG PO TABS: 40.0000 mg | ORAL_TABLET | Freq: Every day | ORAL | 0 refills | Status: DC

## 2021-06-21 ENCOUNTER — Other Ambulatory Visit: Payer: Self-pay | Admitting: Internal Medicine

## 2021-06-22 ENCOUNTER — Other Ambulatory Visit: Payer: Self-pay

## 2021-07-14 ENCOUNTER — Other Ambulatory Visit: Payer: Self-pay | Admitting: Internal Medicine

## 2021-07-17 ENCOUNTER — Other Ambulatory Visit: Payer: Self-pay | Admitting: Internal Medicine

## 2021-07-19 ENCOUNTER — Other Ambulatory Visit: Payer: Self-pay

## 2021-07-19 MED ORDER — ROSUVASTATIN CALCIUM 40 MG PO TABS: 40.0000 mg | ORAL_TABLET | Freq: Every day | ORAL | 0 refills | Status: DC

## 2021-07-26 ENCOUNTER — Other Ambulatory Visit: Payer: Self-pay | Admitting: Internal Medicine

## 2021-07-27 ENCOUNTER — Other Ambulatory Visit (INDEPENDENT_AMBULATORY_CARE_PROVIDER_SITE_OTHER): Payer: BC Managed Care – PPO

## 2021-07-27 DIAGNOSIS — E559 Vitamin D deficiency, unspecified: Secondary | ICD-10-CM

## 2021-07-27 LAB — VITAMIN D 25 HYDROXY (VIT D DEFICIENCY, FRACTURES): VITD: 34.44 ng/mL (ref 30.00–100.00)

## 2021-08-16 ENCOUNTER — Other Ambulatory Visit: Payer: Self-pay | Admitting: Internal Medicine

## 2021-08-16 ENCOUNTER — Other Ambulatory Visit: Payer: Self-pay

## 2021-08-16 ENCOUNTER — Telehealth: Payer: Self-pay | Admitting: Internal Medicine

## 2021-08-16 MED ORDER — CLOPIDOGREL BISULFATE 75 MG PO TABS: ORAL_TABLET | ORAL | 0 refills | Status: DC

## 2021-08-16 NOTE — Telephone Encounter (Signed)
*  STAT* If patient is at the pharmacy, call can be transferred to refill team.   1. Which medications need to be refilled? (please list name of each medication and dose if known) Clopidogrel  2. Which pharmacy/location (including street and city if local pharmacy) is medication to be sent to? [facility] [address], [place]  3. Do they need a 30 day or 90 day supply? needs his medicine today please- He will see Vin tomorrow(08-17-21

## 2021-08-16 NOTE — Telephone Encounter (Signed)
Pt's medication was sent to pt's pharmacy as requested. Confirmation received.  °

## 2021-08-17 ENCOUNTER — Encounter: Payer: Self-pay | Admitting: Physician Assistant

## 2021-08-17 ENCOUNTER — Ambulatory Visit (INDEPENDENT_AMBULATORY_CARE_PROVIDER_SITE_OTHER): Payer: BC Managed Care – PPO | Admitting: Physician Assistant

## 2021-08-17 VITALS — BP 140/70 | HR 71 | Ht 65.0 in | Wt 203.0 lb

## 2021-08-17 DIAGNOSIS — I25118 Atherosclerotic heart disease of native coronary artery with other forms of angina pectoris: Secondary | ICD-10-CM

## 2021-08-17 DIAGNOSIS — R0609 Other forms of dyspnea: Secondary | ICD-10-CM

## 2021-08-17 DIAGNOSIS — E782 Mixed hyperlipidemia: Secondary | ICD-10-CM | POA: Diagnosis not present

## 2021-08-17 DIAGNOSIS — I10 Essential (primary) hypertension: Secondary | ICD-10-CM

## 2021-08-17 MED ORDER — METOPROLOL SUCCINATE ER 50 MG PO TB24: 50.0000 mg | ORAL_TABLET | Freq: Every day | ORAL | 1 refills | Status: DC

## 2021-08-17 NOTE — Patient Instructions (Signed)
Medication Instructions:  INCREASE Toprol to '50mg'$  Take 1 tablet once day  *If you need a refill on your cardiac medications before your next appointment, please call your pharmacy*   Lab Work: None Ordered   Testing/Procedures: None Ordered   Follow-Up: At [facility], you and your health needs are our priority.  As part of our continuing mission to provide you with exceptional heart care, we have created designated Provider Care Teams.  These Care Teams include your primary Cardiologist (physician) and Advanced Practice Providers (APPs -  Physician Assistants and Nurse Practitioners) who all work together to provide you with the care you need, when you need it.  We recommend signing up for the patient portal called "MyChart".  Sign up information is provided on this After Visit Summary.  MyChart is used to connect with patients for Virtual Visits (Telemedicine).  Patients are able to view lab/test results, encounter notes, upcoming appointments, etc.  Non-urgent messages can be sent to your provider as well.   To learn more about what you can do with MyChart, go to [email or web address].    Your next appointment:   4-6 week(s)  The format for your next appointment:   In Person  Provider:   [name], MD or APP while Dr Harrington Challenger is in the office    Other Instructions   Important Information About Sugar
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
## 2021-08-20 ENCOUNTER — Other Ambulatory Visit: Payer: Self-pay | Admitting: Internal Medicine

## 2021-08-20 DIAGNOSIS — I10 Essential (primary) hypertension: Secondary | ICD-10-CM

## 2021-08-27 ENCOUNTER — Other Ambulatory Visit: Payer: Self-pay | Admitting: Internal Medicine

## 2021-08-30 ENCOUNTER — Telehealth: Payer: Self-pay | Admitting: Internal Medicine

## 2021-08-30 NOTE — Telephone Encounter (Signed)
Spoke with pt and notified pt that it would be up to the Dentist performing the procedure if should hold Plavix and Asa Per pt dentist office called right before this office called and said with the new guidelines no need to hold meds with just one tooth extraction Pt appreciative for call ./cy

## 2021-08-30 NOTE — Telephone Encounter (Signed)
Pt c/o medication issue:  1. Name of Medication: Clopidogrel and Aspirin  2. How are you currently taking this medication (dosage and times per day)?   3. Are you having a reaction (difficulty breathing--STAT)?   4. What is your medication issue? Having  a tooth extracted tomorrow(08-31-21)- he wants to know . Should he have stopped these medicine
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
## 2021-09-12 ENCOUNTER — Other Ambulatory Visit: Payer: Self-pay | Admitting: Internal Medicine

## 2021-09-13 ENCOUNTER — Other Ambulatory Visit: Payer: Self-pay

## 2021-09-13 MED ORDER — CLOPIDOGREL BISULFATE 75 MG PO TABS: ORAL_TABLET | ORAL | 3 refills | Status: DC

## 2021-09-14 ENCOUNTER — Ambulatory Visit: Payer: BC Managed Care – PPO | Attending: Nurse Practitioner | Admitting: Nurse Practitioner

## 2021-09-14 ENCOUNTER — Encounter: Payer: Self-pay | Admitting: Nurse Practitioner

## 2021-09-14 VITALS — BP 112/68 | HR 63 | Ht 66.0 in | Wt 202.0 lb

## 2021-09-14 DIAGNOSIS — I119 Hypertensive heart disease without heart failure: Secondary | ICD-10-CM

## 2021-09-14 DIAGNOSIS — I25118 Atherosclerotic heart disease of native coronary artery with other forms of angina pectoris: Secondary | ICD-10-CM

## 2021-09-14 DIAGNOSIS — I208 Other forms of angina pectoris: Secondary | ICD-10-CM | POA: Diagnosis not present

## 2021-09-14 DIAGNOSIS — I422 Other hypertrophic cardiomyopathy: Secondary | ICD-10-CM | POA: Diagnosis not present

## 2021-09-14 DIAGNOSIS — R001 Bradycardia, unspecified: Secondary | ICD-10-CM

## 2021-09-14 DIAGNOSIS — R0602 Shortness of breath: Secondary | ICD-10-CM | POA: Diagnosis not present

## 2021-09-14 DIAGNOSIS — I493 Ventricular premature depolarization: Secondary | ICD-10-CM | POA: Diagnosis not present

## 2021-09-14 DIAGNOSIS — E785 Hyperlipidemia, unspecified: Secondary | ICD-10-CM

## 2021-09-14 MED ORDER — SODIUM CHLORIDE 0.9% FLUSH: 3.0000 mL | Freq: Two times a day (BID) | INTRAVENOUS | Status: AC

## 2021-09-14 NOTE — Patient Instructions (Addendum)
Medication Instructions:  Your physician recommends that you continue on your current medications as directed. Please refer to the Current Medication list given to you today.  *If you need a refill on your cardiac medications before your next appointment, please call your pharmacy*   Lab Work: TODAY: BMET, CBC If you have labs (blood work) drawn today and your tests are completely normal, you will receive your results only by: [place] (if you have MyChart) OR A paper copy in the mail If you have any lab test that is abnormal or we need to change your treatment, we will call you to review the results.   Testing/Procedures:  [place] A DEPT OF [place] A DEPT OF [facility] [place], [place] [id number] [place] [address] Dept: [phone] Loc: [place]  09/14/2021  You are scheduled for a Cardiac Catheterization on Tuesday, September 19 with Dr. Larae Grooms.  1. Please arrive at the [facility] (Main Entrance A) at [facility]: [address] [place], [place] [address] at 5:30 AM (This time is two hours before your procedure to ensure your preparation). Free valet parking service is available.   Special note: Every effort is made to have your procedure done on time. Please understand that emergencies sometimes delay scheduled procedures.  2. Diet: Do not eat solid foods after midnight.  The patient may have clear liquids until 5am upon the day of the procedure.  3. Labs: You will need to have blood drawn on TODAY  4. Medication instructions in preparation for your procedure:   Contrast Allergy: No  On the morning of your procedure, take your Aspirin and Plavix/Clopidogrel and any morning medicines NOT listed above.  You may use sips of water.  5. Plan for one night stay--bring personal belongings. 6. Bring a current list of your  medications and current insurance cards. 7. You MUST have a responsible person to drive you home. 8. Someone MUST be with you the first 24 hours after you arrive home or your discharge will be delayed. 9. Please wear clothes that are easy to get on and off and wear slip-on shoes.  Thank you for allowing [place] to care for you!   -- [facility]    Follow-Up: At [facility], you and your health needs are our priority.  As part of our continuing mission to provide you with exceptional heart care, we have created designated Provider Care Teams.  These Care Teams include your primary Cardiologist (physician) and Advanced Practice Providers (APPs -  Physician Assistants and Nurse Practitioners) who all work together to provide you with the care you need, when you need it.  We recommend signing up for the patient portal called "MyChart".  Sign up information is provided on this After Visit Summary.  MyChart is used to connect with patients for Virtual Visits (Telemedicine).  Patients are able to view lab/test results, encounter notes, upcoming appointments, etc.  Non-urgent messages can be sent to your provider as well.   To learn more about what you can do with MyChart, go to [email or web address].    Your next appointment:   KEEP SCHEDULED FOLLOW-UP  Important Information About [place]

## 2021-09-15 ENCOUNTER — Telehealth: Payer: Self-pay | Admitting: Internal Medicine

## 2021-09-15 ENCOUNTER — Other Ambulatory Visit: Payer: Self-pay | Admitting: Internal Medicine

## 2021-09-15 LAB — CBC
Hematocrit: 48.1 % (ref 37.5–51.0)
Hemoglobin: 15.3 g/dL (ref 13.0–17.7)
MCH: 28.2 pg (ref 26.6–33.0)
MCHC: 31.8 g/dL (ref 31.5–35.7)
MCV: 89 fL (ref 79–97)
Platelets: 187 10*3/uL (ref 150–450)
RBC: 5.43 x10E6/uL (ref 4.14–5.80)
RDW: 14.3 % (ref 11.6–15.4)
WBC: 5 10*3/uL (ref 3.4–10.8)

## 2021-09-15 LAB — BASIC METABOLIC PANEL
BUN/Creatinine Ratio: 11 (ref 10–24)
BUN: 11 mg/dL (ref 8–27)
CO2: 24 mmol/L (ref 20–29)
Calcium: 9.5 mg/dL (ref 8.6–10.2)
Chloride: 106 mmol/L (ref 96–106)
Creatinine, Ser: 1.01 mg/dL (ref 0.76–1.27)
Glucose: 96 mg/dL (ref 70–99)
Potassium: 4.3 mmol/L (ref 3.5–5.2)
Sodium: 142 mmol/L (ref 134–144)
eGFR: 80 mL/min/{1.73_m2} (ref >59)

## 2021-09-15 NOTE — Addendum Note (Signed)
Addended by: Gwendlyn Deutscher on: 09/15/2021 12:55 PM   Modules accepted: Orders

## 2021-09-15 NOTE — Telephone Encounter (Signed)
See lab results ./cy 

## 2021-09-15 NOTE — Telephone Encounter (Signed)
Patient is returning call to discuss lab results. 

## 2021-09-20 ENCOUNTER — Telehealth: Payer: Self-pay | Admitting: *Deleted

## 2021-09-20 NOTE — Telephone Encounter (Signed)
Cardiac Catheterization scheduled at [facility] for: Tuesday September 21, 2021 7:30 AM Arrival time and place: [place] Entrance A at: 5:30 AM  Nothing to eat after midnight prior to procedure, clear liquids until 5 AM day of procedure.  Medication instructions: -Usual morning medications can be taken with sips of water including aspirin 81 mg and Plavix 75 mg.  Confirmed patient has responsible adult to drive home post procedure and be with patient first 24 hours after arriving home.  Patient reports no new symptoms concerning for COVID-19 in the past 10 days.  Reviewed procedure instructions with patient.

## 2021-09-21 ENCOUNTER — Ambulatory Visit (HOSPITAL_COMMUNITY)
Admission: RE | Admit: 2021-09-21 | Discharge: 2021-09-21 | Disposition: A | Discharge status: Home / Self Care | Payer: BC Managed Care – PPO | Source: Ambulatory Visit | Attending: Interventional Cardiology | Admitting: Interventional Cardiology

## 2021-09-21 ENCOUNTER — Encounter (HOSPITAL_COMMUNITY)
Admission: RE | Disposition: A | Discharge status: Home / Self Care | Payer: Self-pay | Source: Ambulatory Visit | Attending: Interventional Cardiology

## 2021-09-21 ENCOUNTER — Ambulatory Visit (HOSPITAL_BASED_OUTPATIENT_CLINIC_OR_DEPARTMENT_OTHER): Payer: BC Managed Care – PPO

## 2021-09-21 ENCOUNTER — Encounter (HOSPITAL_COMMUNITY): Payer: Self-pay | Admitting: Interventional Cardiology

## 2021-09-21 ENCOUNTER — Other Ambulatory Visit: Payer: Self-pay

## 2021-09-21 DIAGNOSIS — I2510 Atherosclerotic heart disease of native coronary artery without angina pectoris: Secondary | ICD-10-CM

## 2021-09-21 DIAGNOSIS — I252 Old myocardial infarction: Secondary | ICD-10-CM | POA: Insufficient documentation

## 2021-09-21 DIAGNOSIS — R0609 Other forms of dyspnea: Secondary | ICD-10-CM | POA: Diagnosis not present

## 2021-09-21 DIAGNOSIS — I25110 Atherosclerotic heart disease of native coronary artery with unstable angina pectoris: Secondary | ICD-10-CM | POA: Diagnosis not present

## 2021-09-21 DIAGNOSIS — Z8546 Personal history of malignant neoplasm of prostate: Secondary | ICD-10-CM | POA: Diagnosis not present

## 2021-09-21 DIAGNOSIS — Z955 Presence of coronary angioplasty implant and graft: Secondary | ICD-10-CM | POA: Insufficient documentation

## 2021-09-21 DIAGNOSIS — I25118 Atherosclerotic heart disease of native coronary artery with other forms of angina pectoris: Secondary | ICD-10-CM

## 2021-09-21 DIAGNOSIS — R0602 Shortness of breath: Secondary | ICD-10-CM | POA: Insufficient documentation

## 2021-09-21 DIAGNOSIS — I119 Hypertensive heart disease without heart failure: Secondary | ICD-10-CM | POA: Diagnosis not present

## 2021-09-21 DIAGNOSIS — Z79899 Other long term (current) drug therapy: Secondary | ICD-10-CM | POA: Insufficient documentation

## 2021-09-21 DIAGNOSIS — I208 Other forms of angina pectoris: Secondary | ICD-10-CM

## 2021-09-21 DIAGNOSIS — Z7902 Long term (current) use of antithrombotics/antiplatelets: Secondary | ICD-10-CM | POA: Insufficient documentation

## 2021-09-21 DIAGNOSIS — E785 Hyperlipidemia, unspecified: Secondary | ICD-10-CM | POA: Insufficient documentation

## 2021-09-21 DIAGNOSIS — Z7982 Long term (current) use of aspirin: Secondary | ICD-10-CM | POA: Insufficient documentation

## 2021-09-21 HISTORY — PX: LEFT HEART CATH AND CORONARY ANGIOGRAPHY: CATH118249

## 2021-09-21 LAB — ECHOCARDIOGRAM COMPLETE
AR max vel: 3.17 cm2
AV Area VTI: 3.06 cm2
AV Area mean vel: 2.95 cm2
AV Mean grad: 2 mmHg
AV Peak grad: 3.8 mmHg
Ao pk vel: 0.98 m/s
Area-P 1/2: 3.99 cm2
Height: 66 in
S' Lateral: 2.7 cm
Weight: 3136 oz

## 2021-09-21 SURGERY — LEFT HEART CATH AND CORONARY ANGIOGRAPHY
Anesthesia: LOCAL

## 2021-09-21 MED ORDER — LIDOCAINE HCL (PF) 1 % IJ SOLN
INTRAMUSCULAR | Status: DC | PRN
  Administered 2021-09-21: 2 mL

## 2021-09-21 MED ORDER — SODIUM CHLORIDE 0.9 % IV SOLN: INTRAVENOUS | Status: AC

## 2021-09-21 MED ORDER — SODIUM CHLORIDE 0.9 % WEIGHT BASED INFUSION
3.0000 mL/kg/h | INTRAVENOUS | Status: AC
  Administered 2021-09-21: 3 mL/kg/h via INTRAVENOUS

## 2021-09-21 MED ORDER — LABETALOL HCL 5 MG/ML IV SOLN: 10.0000 mg | INTRAVENOUS | Status: DC | PRN

## 2021-09-21 MED ORDER — HEPARIN (PORCINE) IN NACL 1000-0.9 UT/500ML-% IV SOLN
INTRAVENOUS | Status: DC | PRN
  Administered 2021-09-21 (×2): 500 mL

## 2021-09-21 MED ORDER — SODIUM CHLORIDE 0.9% FLUSH: 3.0000 mL | INTRAVENOUS | Status: DC | PRN

## 2021-09-21 MED ORDER — ASPIRIN 81 MG PO CHEW: 81.0000 mg | CHEWABLE_TABLET | ORAL | Status: DC

## 2021-09-21 MED ORDER — SODIUM CHLORIDE 0.9 % WEIGHT BASED INFUSION: 1.0000 mL/kg/h | INTRAVENOUS | Status: DC

## 2021-09-21 MED ORDER — ONDANSETRON HCL 4 MG/2ML IJ SOLN: 4.0000 mg | Freq: Four times a day (QID) | INTRAMUSCULAR | Status: DC | PRN

## 2021-09-21 MED ORDER — FENTANYL CITRATE (PF) 100 MCG/2ML IJ SOLN
INTRAMUSCULAR | Status: DC | PRN
  Administered 2021-09-21: 25 ug via INTRAVENOUS

## 2021-09-21 MED ORDER — HEPARIN SODIUM (PORCINE) 1000 UNIT/ML IJ SOLN
INTRAMUSCULAR | Status: DC | PRN
  Administered 2021-09-21: 4500 [IU] via INTRAVENOUS

## 2021-09-21 MED ORDER — ACETAMINOPHEN 325 MG PO TABS: 650.0000 mg | ORAL_TABLET | ORAL | Status: DC | PRN

## 2021-09-21 MED ORDER — HEPARIN (PORCINE) IN NACL 1000-0.9 UT/500ML-% IV SOLN
INTRAVENOUS | Status: AC
  Filled 2021-09-21: qty 1000

## 2021-09-21 MED ORDER — SODIUM CHLORIDE 0.9% FLUSH: 3.0000 mL | Freq: Two times a day (BID) | INTRAVENOUS | Status: DC

## 2021-09-21 MED ORDER — VERAPAMIL HCL 2.5 MG/ML IV SOLN
INTRAVENOUS | Status: AC
  Filled 2021-09-21: qty 2

## 2021-09-21 MED ORDER — VERAPAMIL HCL 2.5 MG/ML IV SOLN
INTRAVENOUS | Status: DC | PRN
  Administered 2021-09-21: 5 mL via INTRA_ARTERIAL
  Administered 2021-09-21: 10 mL via INTRA_ARTERIAL

## 2021-09-21 MED ORDER — HYDRALAZINE HCL 20 MG/ML IJ SOLN: 10.0000 mg | INTRAMUSCULAR | Status: DC | PRN

## 2021-09-21 MED ORDER — FENTANYL CITRATE (PF) 100 MCG/2ML IJ SOLN
INTRAMUSCULAR | Status: AC
  Filled 2021-09-21: qty 2

## 2021-09-21 MED ORDER — MIDAZOLAM HCL 2 MG/2ML IJ SOLN
INTRAMUSCULAR | Status: AC
  Filled 2021-09-21: qty 2

## 2021-09-21 MED ORDER — IOHEXOL 350 MG/ML SOLN
INTRAVENOUS | Status: DC | PRN
  Administered 2021-09-21: 80 mL

## 2021-09-21 MED ORDER — MIDAZOLAM HCL 2 MG/2ML IJ SOLN
INTRAMUSCULAR | Status: DC | PRN
  Administered 2021-09-21: 2 mg via INTRAVENOUS

## 2021-09-21 MED ORDER — HEPARIN SODIUM (PORCINE) 1000 UNIT/ML IJ SOLN
INTRAMUSCULAR | Status: AC
  Filled 2021-09-21: qty 10

## 2021-09-21 MED ORDER — LIDOCAINE HCL (PF) 1 % IJ SOLN
INTRAMUSCULAR | Status: AC
  Filled 2021-09-21: qty 30

## 2021-09-21 MED ORDER — SODIUM CHLORIDE 0.9 % IV SOLN: 250.0000 mL | INTRAVENOUS | Status: DC | PRN

## 2021-09-21 SURGICAL SUPPLY — 11 items
CATH INFINITI 5FR MULTPACK ANG (CATHETERS) IMPLANT
DEVICE RAD COMP TR BAND LRG (VASCULAR PRODUCTS) IMPLANT
ELECT DEFIB PAD ADLT CADENCE (PAD) IMPLANT
GLIDESHEATH SLEND SS 6F .021 (SHEATH) IMPLANT
GUIDEWIRE INQWIRE 1.5J.035X260 (WIRE) IMPLANT
INQWIRE 1.5J .035X260CM (WIRE) ×1
KIT HEART LEFT (KITS) ×2 IMPLANT
PACK CARDIAC CATHETERIZATION (CUSTOM PROCEDURE TRAY) ×2 IMPLANT
SHEATH PROBE COVER 6X72 (BAG) IMPLANT
TRANSDUCER W/STOPCOCK (MISCELLANEOUS) ×2 IMPLANT
TUBING CIL FLEX 10 FLL-RA (TUBING) ×2 IMPLANT

## 2021-09-21 NOTE — Progress Notes (Signed)
Patient and wife was given discharge instructions. Both verbalized understanding. 

## 2021-09-21 NOTE — Progress Notes (Signed)
TR BAND REMOVAL  LOCATION:    left radial  DEFLATED PER PROTOCOL:    Yes.    TIME BAND OFF / DRESSING APPLIED:    1030   SITE UPON ARRIVAL:    Level 0  SITE AFTER BAND REMOVAL:    Level 0  CIRCULATION SENSATION AND MOVEMENT:    Within Normal Limits   Yes.    COMMENTS:   No bleeding noted

## 2021-09-21 NOTE — Interval H&P Note (Signed)
Cath Lab Visit (complete for each Cath Lab visit)  Clinical Evaluation Leading to the Procedure:   ACS: No.  Non-ACS:    Anginal Classification: CCS III  Anti-ischemic medical therapy: Minimal Therapy (1 class of medications)  Non-Invasive Test Results: No non-invasive testing performed  Prior CABG: No previous CABG      History and Physical Interval Note:  09/21/2021 7:42 AM  Manuel Barker  has presented today for surgery, with the diagnosis of unstable angina - shortness of breath.  The various methods of treatment have been discussed with the patient and family. After consideration of risks, benefits and other options for treatment, the patient has consented to  Procedure(s): LEFT HEART CATH AND CORONARY ANGIOGRAPHY (N/A) as a surgical intervention.  The patient's history has been reviewed, patient examined, no change in status, stable for surgery.  I have reviewed the patient's chart and labs.  Questions were answered to the patient's satisfaction.     Larae Grooms

## 2021-09-21 NOTE — Discharge Instructions (Signed)

## 2021-09-24 ENCOUNTER — Telehealth: Payer: Self-pay | Admitting: Interventional Cardiology

## 2021-09-24 MED ORDER — AMLODIPINE BESYLATE 10 MG PO TABS: 10.0000 mg | ORAL_TABLET | Freq: Every day | ORAL | 1 refills | Status: DC

## 2021-09-24 NOTE — Telephone Encounter (Signed)
    Patient's cath results were discussed in our heart team meeting this morning.  Prior films from 2018 were also compared.  Given that there did not seem to be much change from 2018, the consensus was to continue with medical therapy.  I conveyed this to the patient.  He also mentioned that his blood pressure has been spiking at times.  Better control of his hypertension may help relieve his symptoms.  He continues to have mild nausea at the end of his 1 mile walk.  He has decreased activity as per our instructions prior to his catheterization.  Multiple ECGs from prior years were reviewed.  He has a typical pattern for apical hypertrophy so it is difficult to judge ischemia based on his chronic T wave inversions in the anterior precordium.  Given his apical hypertrophy, he could have supply/demand mismatch as a cause for symptoms as well, not related to epicardial disease.  Continue medical therapy for now.  In terms of his anatomy, he has a large bifurcating diagonal with complex disease.  This likely could not be fixed with PCI.  Therefore the thought was that PCI of his LAD alone would still leave a large area of ischemia.  If his other disease progresses, bypass surgery would likely be is most optimal therapy.  Otherwise we will plan to treat medically for now.  I spoke to the patient by phone and conveyed this to him.  He mentioned again about his blood pressure spikes.  We will have him increase his amlodipine from 5 mg to 10 mg to see if symptoms improve.  I told him he could gradually start walking again.  If he felt symptoms, he should stop his activity until the symptoms dissipate.  We will arrange for follow-up as well.   [name], MD

## 2021-09-24 NOTE — Telephone Encounter (Signed)
Pt's case was reviewed at conference this morning    My understanding is the plan was for medical Rx, not surgery    I would cancel his appt with [place]   I think he has an appt with me

## 2021-09-24 NOTE — Telephone Encounter (Signed)
Patient notified of amlodipine increase.  Prescription sent to [facility] on [address].  Patient aware of follow up appointment with Ambrose Pancoast, NP on October 05, 2021

## 2021-09-24 NOTE — Telephone Encounter (Signed)
Appointment with Dr Kipp Brood has been canceled.  I spoke with patient and told him he did not need to see surgeon at this time.

## 2021-09-28 ENCOUNTER — Ambulatory Visit: Payer: Medicare Other | Admitting: Nurse Practitioner

## 2021-10-01 ENCOUNTER — Encounter: Payer: Medicare Other | Admitting: Thoracic Surgery (Cardiothoracic Vascular Surgery)
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
## 2021-10-05 ENCOUNTER — Ambulatory Visit: Payer: BC Managed Care – PPO | Attending: Nurse Practitioner | Admitting: Nurse Practitioner

## 2021-10-05 ENCOUNTER — Encounter: Payer: Self-pay | Admitting: Nurse Practitioner

## 2021-10-05 VITALS — BP 118/72 | HR 58 | Ht 66.0 in | Wt 201.0 lb

## 2021-10-05 DIAGNOSIS — E782 Mixed hyperlipidemia: Secondary | ICD-10-CM | POA: Diagnosis not present

## 2021-10-05 DIAGNOSIS — I10 Essential (primary) hypertension: Secondary | ICD-10-CM | POA: Diagnosis not present

## 2021-10-05 DIAGNOSIS — I25118 Atherosclerotic heart disease of native coronary artery with other forms of angina pectoris: Secondary | ICD-10-CM | POA: Diagnosis not present

## 2021-10-05 DIAGNOSIS — R0609 Other forms of dyspnea: Secondary | ICD-10-CM | POA: Diagnosis not present

## 2021-10-05 NOTE — Patient Instructions (Addendum)
Medication Instructions:   Your physician recommends that you continue on your current medications as directed. Please refer to the Current Medication list given to you today.   *If you need a refill on your cardiac medications before your next appointment, please call your pharmacy*   Lab Work:  None ordered.  If you have labs (blood work) drawn today and your tests are completely normal, you will receive your results only by: [place] (if you have MyChart) OR A paper copy in the mail If you have any lab test that is abnormal or we need to change your treatment, we will call you to review the results.   Testing/Procedures:  None ordered.   Follow-Up: At [facility], you and your health needs are our priority.  As part of our continuing mission to provide you with exceptional heart care, we have created designated Provider Care Teams.  These Care Teams include your primary Cardiologist (physician) and Advanced Practice Providers (APPs -  Physician Assistants and Nurse Practitioners) who all work together to provide you with the care you need, when you need it.  We recommend signing up for the patient portal called "MyChart".  Sign up information is provided on this After Visit Summary.  MyChart is used to connect with patients for Virtual Visits (Telemedicine).  Patients are able to view lab/test results, encounter notes, upcoming appointments, etc.  Non-urgent messages can be sent to your provider as well.   To learn more about what you can do with MyChart, go to [email or web address].    Your next appointment:   1 month(s)  The format for your next appointment:   In Person  Provider:   Ambrose Pancoast, NP         Other Instructions  You have been referred to Cardiac Rehab.  You should be hearing from that office to schedule your appointments. There is a wait list.    Important Information About [place]

## 2021-10-09 ENCOUNTER — Other Ambulatory Visit: Payer: Self-pay | Admitting: Internal Medicine

## 2021-10-10 NOTE — Progress Notes (Signed)
Please set patient up for cardiac rehab

## 2021-10-11 ENCOUNTER — Telehealth: Payer: Self-pay

## 2021-10-11 ENCOUNTER — Other Ambulatory Visit: Payer: Self-pay | Admitting: *Deleted

## 2021-10-11 NOTE — Telephone Encounter (Signed)
Cardiac Rehab order placed but not sure of the pt qualifies based on last cath... will send note to the cardiac rehab nurse to see if he is a candidate for any other program.

## 2021-10-11 NOTE — Telephone Encounter (Signed)
-----   Message from [name], MD sent at 10/10/2021 10:05 PM EDT -----    ----- Message ----- From: Marylu Lund., NP Sent: 10/05/2021   9:58 AM EDT To: [name], MD  Dr. Harrington Challenger, Mr. Manuel Barker was seen today in clinic following his recent left heart catheterization.  He reports that his chest discomfort has improved and has no report of shortness of breath with rest.  He is interested in increasing his physical activity and I wanted to see if you have think he would be a good candidate for cardiac rehab.  He is very motivated to get back to his exercise routine and would benefit from being in a controlled environment.  Please let me know your thoughts and if in agreement could you put in an order for referral.  Thank you, Ambrose Pancoast, NP

## 2021-10-12 ENCOUNTER — Telehealth: Payer: Self-pay

## 2021-10-12 NOTE — Addendum Note (Signed)
Addended by: Stephani Police on: 10/12/2021 12:51 PM   Modules accepted: Orders

## 2021-10-12 NOTE — Telephone Encounter (Signed)
error 

## 2021-10-12 NOTE — Telephone Encounter (Signed)
Rowe Pavy, RN  Stephani Police, RN See below, Can you place a new referral with Chronic Stable Angina as the diagnosis.  Thanks Marinda Elk   Order replaced.

## 2021-10-18 ENCOUNTER — Telehealth: Payer: Self-pay | Admitting: Nurse Practitioner

## 2021-10-18 NOTE — Telephone Encounter (Signed)
The patient was asking if we prescribed antibiotics after his heart cath. She he couldn't remember if it was [place] or his dentist. Located after visit summary and advise no medication changes were made by [place].  Verbalized understanding.

## 2021-10-18 NOTE — Telephone Encounter (Signed)
Patient is calling and has some questions regarding medication. Please call back

## 2021-10-27 ENCOUNTER — Ambulatory Visit: Payer: Medicare Other | Admitting: Internal Medicine
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
## 2021-11-09 ENCOUNTER — Ambulatory Visit: Payer: Medicare Other | Admitting: Nurse Practitioner

## 2021-11-10 ENCOUNTER — Encounter: Payer: Self-pay | Admitting: Nurse Practitioner

## 2021-11-10 ENCOUNTER — Ambulatory Visit: Payer: Medicare Other | Admitting: Internal Medicine

## 2021-11-10 ENCOUNTER — Ambulatory Visit: Payer: BC Managed Care – PPO | Attending: Nurse Practitioner | Admitting: Nurse Practitioner

## 2021-11-10 VITALS — BP 114/66 | HR 61 | Ht 66.0 in | Wt 203.0 lb

## 2021-11-10 DIAGNOSIS — I10 Essential (primary) hypertension: Secondary | ICD-10-CM | POA: Diagnosis not present

## 2021-11-10 DIAGNOSIS — R0609 Other forms of dyspnea: Secondary | ICD-10-CM

## 2021-11-10 DIAGNOSIS — I25110 Atherosclerotic heart disease of native coronary artery with unstable angina pectoris: Secondary | ICD-10-CM

## 2021-11-10 NOTE — Patient Instructions (Addendum)
Medication Instructions:  Your physician recommends that you continue on your current medications as directed. Please refer to the Current Medication list given to you today. *If you need a refill on your cardiac medications before your next appointment, please call your pharmacy*   Lab Work: None ordered   Testing/Procedures: None Ordered   Follow-Up: At [facility], you and your health needs are our priority.  As part of our continuing mission to provide you with exceptional heart care, we have created designated Provider Care Teams.  These Care Teams include your primary Cardiologist (physician) and Advanced Practice Providers (APPs -  Physician Assistants and Nurse Practitioners) who all work together to provide you with the care you need, when you need it.  We recommend signing up for the patient portal called "MyChart".  Sign up information is provided on this After Visit Summary.  MyChart is used to connect with patients for Virtual Visits (Telemedicine).  Patients are able to view lab/test results, encounter notes, upcoming appointments, etc.  Non-urgent messages can be sent to your provider as well.   To learn more about what you can do with MyChart, go to [email or web address].    Your next appointment:   3 month(s)  The format for your next appointment:   In Person  Provider:   [name], MD  Other Instructions   Important Information About Sugar

## 2021-11-11 ENCOUNTER — Telehealth (HOSPITAL_COMMUNITY): Payer: Self-pay

## 2021-11-11 ENCOUNTER — Ambulatory Visit (INDEPENDENT_AMBULATORY_CARE_PROVIDER_SITE_OTHER): Payer: BC Managed Care – PPO | Admitting: Internal Medicine

## 2021-11-11 VITALS — BP 111/60 | HR 64 | Temp 98.5°F | Wt 203.3 lb

## 2021-11-11 DIAGNOSIS — I25110 Atherosclerotic heart disease of native coronary artery with unstable angina pectoris: Secondary | ICD-10-CM | POA: Diagnosis not present

## 2021-11-11 DIAGNOSIS — E785 Hyperlipidemia, unspecified: Secondary | ICD-10-CM

## 2021-11-11 DIAGNOSIS — I2089 Other forms of angina pectoris: Secondary | ICD-10-CM | POA: Diagnosis not present

## 2021-11-11 DIAGNOSIS — R7302 Impaired glucose tolerance (oral): Secondary | ICD-10-CM

## 2021-11-11 DIAGNOSIS — I10 Essential (primary) hypertension: Secondary | ICD-10-CM | POA: Diagnosis not present

## 2021-11-11 DIAGNOSIS — Z23 Encounter for immunization: Secondary | ICD-10-CM

## 2021-11-11 LAB — POCT GLYCOSYLATED HEMOGLOBIN (HGB A1C): Hemoglobin A1C: 5.9 % — AB (ref 4.0–5.6)
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
## 2021-11-11 NOTE — Telephone Encounter (Signed)
Called and spoke with pt in regards to CR, pt stated he is interested in CR. Explained scheduling process, patient verbalized understanding.

## 2021-11-12 NOTE — Addendum Note (Signed)
Addended by: Ernie Hew D on: 11/12/2021 04:21 PM   Modules accepted: Orders

## 2021-11-14 ENCOUNTER — Other Ambulatory Visit: Payer: Self-pay | Admitting: Internal Medicine

## 2021-12-18 ENCOUNTER — Other Ambulatory Visit: Payer: Self-pay | Admitting: Interventional Cardiology

## 2022-01-04 ENCOUNTER — Other Ambulatory Visit: Payer: Self-pay

## 2022-01-04 ENCOUNTER — Other Ambulatory Visit: Payer: Self-pay | Admitting: Internal Medicine

## 2022-01-04 MED ORDER — METOPROLOL SUCCINATE ER 50 MG PO TB24: 50.0000 mg | ORAL_TABLET | Freq: Every day | ORAL | 3 refills | Status: DC

## 2022-01-04 NOTE — Telephone Encounter (Signed)
Pt's medication was sent to pt's pharmacy as requested. Confirmation received.  °

## 2022-01-06 ENCOUNTER — Telehealth (HOSPITAL_COMMUNITY): Payer: Self-pay

## 2022-01-06 NOTE — Telephone Encounter (Signed)
Called patient to see if he was interested in participating in the Cardiac Rehab Program. Patient stated yes. Patient will come in for orientation on 01/11/22'@8'$ :30am and will attend the 1:45pm exercise class.

## 2022-01-07 ENCOUNTER — Telehealth (HOSPITAL_COMMUNITY): Payer: Self-pay | Admitting: *Deleted

## 2022-01-07 ENCOUNTER — Telehealth (HOSPITAL_COMMUNITY): Payer: Self-pay

## 2022-01-07 NOTE — Telephone Encounter (Signed)
Pt returned call from message left earlier.  Cardiac Risk profile and medical history obtained in preparation for his upcoming appt on Tuesday for Cardiac Rehab with the diagnosis of chronic stable angina .  Verified appropriate attire and new address.  PT reports having a twinge of feeling like he is going to throw up but not to the point he vomits or need to take NTG that he has with him when he walks about a mile.  This discomfort goes away with rest.  Sent to Dr. Harrington Challenger, request for guidelines on how best to treat pt "anginal equivalent". Cherre Huger, BSN Cardiac and Training and development officer

## 2022-01-10 ENCOUNTER — Telehealth (HOSPITAL_COMMUNITY): Payer: Self-pay | Admitting: *Deleted

## 2022-01-10 ENCOUNTER — Encounter: Payer: Self-pay | Admitting: Family

## 2022-01-10 ENCOUNTER — Ambulatory Visit (INDEPENDENT_AMBULATORY_CARE_PROVIDER_SITE_OTHER): Payer: BC Managed Care – PPO | Admitting: Family

## 2022-01-10 VITALS — BP 144/80 | HR 74 | Temp 97.6°F | Ht 66.0 in | Wt 207.2 lb

## 2022-01-10 DIAGNOSIS — N3090 Cystitis, unspecified without hematuria: Secondary | ICD-10-CM | POA: Diagnosis not present

## 2022-01-10 DIAGNOSIS — R300 Dysuria: Secondary | ICD-10-CM | POA: Diagnosis not present

## 2022-01-10 LAB — POCT URINALYSIS DIPSTICK
Bilirubin, UA: NEGATIVE
Glucose, UA: NEGATIVE
Ketones, UA: NEGATIVE
Nitrite, UA: NEGATIVE
Protein, UA: POSITIVE — AB
Spec Grav, UA: 1.025 (ref 1.010–1.025)
Urobilinogen, UA: 0.2 E.U./dL
pH, UA: 6 (ref 5.0–8.0)

## 2022-01-10 MED ORDER — SULFAMETHOXAZOLE-TRIMETHOPRIM 800-160 MG PO TABS: 1.0000 | ORAL_TABLET | Freq: Two times a day (BID) | ORAL | 0 refills | Status: AC
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
## 2022-01-10 NOTE — Telephone Encounter (Signed)
-----   Message from [name] V, MD sent at 01/10/2022  2:42 PM EST ----- Regarding: RE: Threshold for "angina" chest discomfort I don't think EKG would be reliable in him due to apical hypertrophy I would start sloo with walking , see how he does. ----- Message ----- From: Rowe Pavy, RN Sent: 01/07/2022   2:41 PM EST To: [name], MD Subject: Threshold for "angina" chest discomfort         Dr. Harrington Challenger,  Your above referred pt to cardiac rehab with the diagnosis of Chronic Stable Angina reports feeling as though he may throw up but doesn't vomit but not pain - he makes this clear when walking a mile that goes away with rest. He does not take NTG feels it is not that severe to do so.  Didn't tolerate Imdur.  There was some mention of starting Ranexa.   Manuel Barker is scheduled for orientation on Tuesday.  Because of the nature of stable angina, would like guidance on how best to proceed for anticipated  chest discomfort with exercise.    What is an acceptable rating scale for chest discomfort for him? Should 12 lead EKG be completed?   Thank you for your input Maurice Small RN, BSN Cardiac and Pulmonary Rehab Nurse Navigator

## 2022-01-11 ENCOUNTER — Encounter (HOSPITAL_COMMUNITY)
Admission: RE | Admit: 2022-01-11 | Discharge: 2022-01-11 | Disposition: A | Discharge status: Home / Self Care | Payer: BC Managed Care – PPO | Source: Ambulatory Visit | Attending: Internal Medicine | Admitting: Internal Medicine

## 2022-01-11 ENCOUNTER — Encounter (HOSPITAL_COMMUNITY): Payer: Self-pay

## 2022-01-11 VITALS — BP 122/72 | HR 64 | Ht 65.75 in | Wt 204.6 lb

## 2022-01-11 DIAGNOSIS — I2089 Other forms of angina pectoris: Secondary | ICD-10-CM

## 2022-01-11 NOTE — Progress Notes (Signed)
Cardiac Rehab Medication Review by a Nurse  Does the patient  feel that his/her medications are working for him/her?  yes  Has the patient been experiencing any side effects to the medications prescribed?  no  Does the patient measure his/her own blood pressure or blood glucose at home?  yes   Does the patient have any problems obtaining medications due to transportation or finances?   no  Understanding of regimen: excellent Understanding of indications: excellent Potential of compliance: excellent    Nurse comments: Manuel Barker is taking his medications as prescribed and has a good understanding of what his medications are for.    Christa See [facility] RN 01/11/2022 10:38 AM

## 2022-01-11 NOTE — Progress Notes (Signed)
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   Diseases of Our Time - Overview  Clinical staff conducted group or individual video education with verbal and written material and guidebook.  Patient learns that according to the CDC, 50% to 70% of chronic diseases (such as obesity, type 2 diabetes, elevated lipids, hypertension, and heart disease) are avoidable through lifestyle improvements including healthier food choices, listening to satiety cues, and increased physical activity.  Sleep Disorders Clinical staff conducted group or individual video education with verbal and written material and guidebook.  Patient learns how good quality and duration of sleep are important to overall health and well-being. Patient also learns about sleep disorders and  how they impact health along with recommendations to address them, including discussing with a physician.  Nutrition  Dining Out - Part 2 Clinical staff conducted group or individual video education with verbal and written material and guidebook.  Patient learns how to plan ahead and communicate in order to maximize their dining experience in a healthy and nutritious manner. Included are recommended food choices based on the type of restaurant the patient is visiting.   Fueling a Best boy conducted group or individual video education with verbal and written material and guidebook.  There is a strong connection between our food choices and our health. Diseases like obesity and type 2 diabetes are very prevalent and are in large-part due to lifestyle choices. The Pritikin Eating Plan provides plenty of food and hunger-curbing satisfaction. It is easy to follow, affordable, and helps reduce health risks.  Menu Workshop  Clinical staff conducted group or individual video education with verbal and written material and guidebook.  Patient learns that restaurant meals can sabotage health goals because they are often packed with calories, fat, sodium, and sugar. Recommendations include strategies to plan ahead and to communicate with the manager, chef, or server to help order a healthier meal.  Planning Your Eating Strategy  Clinical staff conducted group or individual video education with verbal and written material and guidebook.  Patient learns about the [place] and its benefit of reducing the risk of disease. The [place] does not focus on calories. Instead, it emphasizes high-quality, nutrient-rich foods. By knowing the characteristics of the foods, we choose, we can determine their calorie density and make informed decisions.  Targeting Your Nutrition Priorities  Clinical staff conducted group or individual video education with verbal and written  material and guidebook.  Patient learns that lifestyle habits have a tremendous impact on disease risk and progression. This video provides eating and physical activity recommendations based on your personal health goals, such as reducing LDL cholesterol, losing weight, preventing or controlling type 2 diabetes, and reducing high blood pressure.  Vitamins and Minerals  Clinical staff conducted group or individual video education with verbal and written material and guidebook.  Patient learns different ways to obtain key vitamins and minerals, including through a recommended healthy diet. It is important to discuss all supplements you take with your  doctor.   Healthy Mind-Set    Smoking Cessation  Clinical staff conducted group or individual video education with verbal and written material and guidebook.  Patient learns that cigarette smoking and tobacco addiction pose a serious health risk which affects millions of people. Stopping smoking will significantly reduce the risk of heart disease, lung disease, and many forms of cancer. Recommended strategies for quitting are covered, including working with your doctor to develop a successful plan.  Culinary   Becoming a Financial trader conducted group or individual video education with verbal and written material and guidebook.  Patient learns that cooking at home can be healthy, cost-effective, quick, and puts them in control. Keys to cooking healthy recipes will include looking at your recipe, assessing your equipment needs, planning ahead, making it simple, choosing cost-effective seasonal ingredients, and limiting the use of added fats, salts, and sugars.  Cooking - Breakfast and Snacks  Clinical staff conducted group or individual video education with verbal and written material and guidebook.  Patient learns how important breakfast is to satiety and nutrition through the entire day. Recommendations include key foods to eat during  breakfast to help stabilize blood sugar levels and to prevent overeating at meals later in the day. Planning ahead is also a key component.  Cooking - Human resources officer conducted group or individual video education with verbal and written material and guidebook.  Patient learns eating strategies to improve overall health, including an approach to cook more at home. Recommendations include thinking of animal protein as a side on your plate rather than center stage and focusing instead on lower calorie dense options like vegetables, fruits, whole grains, and plant-based proteins, such as beans. Making sauces in large quantities to freeze for later and leaving the skin on your vegetables are also recommended to maximize your experience.  Cooking - Healthy Salads and Dressing Clinical staff conducted group or individual video education with verbal and written material and guidebook.  Patient learns that vegetables, fruits, whole grains, and legumes are the foundations of the [place]. Recommendations include how to incorporate each of these in flavorful and healthy salads, and how to create homemade salad dressings. Proper handling of ingredients is also covered. Cooking - Soups and [employer] - Soups and Desserts Clinical staff conducted group or individual video education with verbal and written material and guidebook.  Patient learns that Pritikin soups and desserts make for easy, nutritious, and delicious snacks and meal components that are low in sodium, fat, sugar, and calorie density, while high in vitamins, minerals, and filling fiber. Recommendations include simple and healthy ideas for soups and desserts.   Overview     The Pritikin Solution Program Overview Clinical staff conducted group or individual video education with verbal and written material and guidebook.  Patient learns that the results of the [place] Program have been documented in more than 100  articles published in peer-reviewed journals, and the benefits include reducing risk factors for (and, in some cases, even reversing) high cholesterol, high blood pressure, type 2 diabetes, obesity, and more! An overview of the three key pillars of the Pritikin Program will be covered: eating well, doing regular exercise, and having a healthy mind-set.  WORKSHOPS  Exercise: Exercise Basics: Building Your Action Plan Clinical staff led group instruction and group discussion with PowerPoint presentation and patient guidebook. To enhance the learning environment the use of posters, models and videos may be added. At the conclusion of  this workshop, patients will comprehend the difference between physical activity and exercise, as well as the benefits of incorporating both, into their routine. Patients will understand the FITT (Frequency, Intensity, Time, and Type) principle and how to use it to build an exercise action plan. In addition, safety concerns and other considerations for exercise and cardiac rehab will be addressed by the presenter. The purpose of this lesson is to promote a comprehensive and effective weekly exercise routine in order to improve patients' overall level of fitness.   Managing Heart Disease: Your Path to a Healthier Heart Clinical staff led group instruction and group discussion with PowerPoint presentation and patient guidebook. To enhance the learning environment the use of posters, models and videos may be added.At the conclusion of this workshop, patients will understand the anatomy and physiology of the heart. Additionally, they will understand how Pritikin's three pillars impact the risk factors, the progression, and the management of heart disease.  The purpose of this lesson is to provide a high-level overview of the heart, heart disease, and how the Pritikin lifestyle positively impacts risk factors.  Exercise Biomechanics Clinical staff led group instruction and  group discussion with PowerPoint presentation and patient guidebook. To enhance the learning environment the use of posters, models and videos may be added. Patients will learn how the structural parts of their bodies function and how these functions impact their daily activities, movement, and exercise. Patients will learn how to promote a neutral spine, learn how to manage pain, and identify ways to improve their physical movement in order to promote healthy living. The purpose of this lesson is to expose patients to common physical limitations that impact physical activity. Participants will learn practical ways to adapt and manage aches and pains, and to minimize their effect on regular exercise. Patients will learn how to maintain good posture while sitting, walking, and lifting.  Balance Training and Fall Prevention  Clinical staff led group instruction and group discussion with PowerPoint presentation and patient guidebook. To enhance the learning environment the use of posters, models and videos may be added. At the conclusion of this workshop, patients will understand the importance of their sensorimotor skills (vision, proprioception, and the vestibular system) in maintaining their ability to balance as they age. Patients will apply a variety of balancing exercises that are appropriate for their current level of function. Patients will understand the common causes for poor balance, possible solutions to these problems, and ways to modify their physical environment in order to minimize their fall risk. The purpose of this lesson is to teach patients about the importance of maintaining balance as they age and ways to minimize their risk of falling.  WORKSHOPS   Nutrition:  Fueling a Scientist, research (physical sciences) led group instruction and group discussion with PowerPoint presentation and patient guidebook. To enhance the learning environment the use of posters, models and videos may be  added. Patients will review the foundational principles of the [place] and understand what constitutes a serving size in each of the food groups. Patients will also learn Pritikin-friendly foods that are better choices when away from home and review make-ahead meal and snack options. Calorie density will be reviewed and applied to three nutrition priorities: weight maintenance, weight loss, and weight gain. The purpose of this lesson is to reinforce (in a group setting) the key concepts around what patients are recommended to eat and how to apply these guidelines when away from home by planning and selecting Pritikin-friendly options. Patients  will understand how calorie density may be adjusted for different weight management goals.  Mindful Eating  Clinical staff led group instruction and group discussion with PowerPoint presentation and patient guidebook. To enhance the learning environment the use of posters, models and videos may be added. Patients will briefly review the concepts of the [place] and the importance of low-calorie dense foods. The concept of mindful eating will be introduced as well as the importance of paying attention to internal hunger signals. Triggers for non-hunger eating and techniques for dealing with triggers will be explored. The purpose of this lesson is to provide patients with the opportunity to review the basic principles of the [place], discuss the value of eating mindfully and how to measure internal cues of hunger and fullness using the Hunger Scale. Patients will also discuss reasons for non-hunger eating and learn strategies to use for controlling emotional eating.  Targeting Your Nutrition Priorities Clinical staff led group instruction and group discussion with PowerPoint presentation and patient guidebook. To enhance the learning environment the use of posters, models and videos may be added. Patients will learn how to determine  their genetic susceptibility to disease by reviewing their family history. Patients will gain insight into the importance of diet as part of an overall healthy lifestyle in mitigating the impact of genetics and other environmental insults. The purpose of this lesson is to provide patients with the opportunity to assess their personal nutrition priorities by looking at their family history, their own health history and current risk factors. Patients will also be able to discuss ways of prioritizing and modifying the [place] for their highest risk areas  Menu  Clinical staff led group instruction and group discussion with PowerPoint presentation and patient guidebook. To enhance the learning environment the use of posters, models and videos may be added. Using menus brought in from [employer], or printed from [employer], patients will apply the [place] dining out guidelines that were presented in the [employer] video. Patients will also be able to practice these guidelines in a variety of provided scenarios. The purpose of this lesson is to provide patients with the opportunity to practice hands-on learning of the [place] with actual menus and practice scenarios.  Label Reading Clinical staff led group instruction and group discussion with PowerPoint presentation and patient guidebook. To enhance the learning environment the use of posters, models and videos may be added. Patients will review and discuss the Pritikin label reading guidelines presented in Pritikin's Label Reading Educational series video. Using fool labels brought in from local grocery stores and markets, patients will apply the label reading guidelines and determine if the packaged food meet the Pritikin guidelines. The purpose of this lesson is to provide patients with the opportunity to review, discuss, and practice hands-on learning of the Pritikin Label Reading guidelines  with actual packaged food labels. [place] Workshops are designed to teach patients ways to prepare quick, simple, and affordable recipes at home. The importance of nutrition's role in chronic disease risk reduction is reflected in its emphasis in the overall Pritikin program. By learning how to prepare essential core Pritikin Eating Plan recipes, patients will increase control over what they eat; be able to customize the flavor of foods without the use of added salt, sugar, or fat; and improve the quality of the food they consume. By learning a set of core recipes which are easily assembled, quickly prepared,  and affordable, patients are more likely to prepare more healthy foods at home. These workshops focus on convenient breakfasts, simple entres, side dishes, and desserts which can be prepared with minimal effort and are consistent with nutrition recommendations for cardiovascular risk reduction. Cooking [employer] are taught by a Engineer, materials (RD) who has been trained by the [employer]. The chef or RD has a clear understanding of the importance of minimizing - if not completely eliminating - added fat, sugar, and sodium in recipes. Throughout the series of [place] Workshop sessions, patients will learn about healthy ingredients and efficient methods of cooking to build confidence in their capability to prepare    Cooking School weekly topics:  Adding Flavor- Sodium-Free  Fast and Healthy Breakfasts  Powerhouse Plant-Based Proteins  Satisfying Salads and Dressings  Simple Sides and Sauces  International Cuisine-Spotlight on the [place] Zones  Delicious Desserts  Savory Soups  Efficiency Cooking - Meals in a Snap  Tasty Appetizers and Snacks  Comforting Weekend Breakfasts  One-Pot Wonders   Fast Evening Meals  Easy Happy (Psychosocial): New  Thoughts, New Behaviors Clinical staff led group instruction and group discussion with PowerPoint presentation and patient guidebook. To enhance the learning environment the use of posters, models and videos may be added. Patients will learn and practice techniques for developing effective health and lifestyle goals. Patients will be able to effectively apply the goal setting process learned to develop at least one new personal goal.  The purpose of this lesson is to expose patients to a new skill set of behavior modification techniques such as techniques setting SMART goals, overcoming barriers, and achieving new thoughts and new behaviors.  Managing Moods and Relationships Clinical staff led group instruction and group discussion with PowerPoint presentation and patient guidebook. To enhance the learning environment the use of posters, models and videos may be added. Patients will learn how emotional and chronic stress factors can impact their health and relationships. They will learn healthy ways to manage their moods and utilize positive coping mechanisms. In addition, ICR patients will learn ways to improve communication skills. The purpose of this lesson is to expose patients to ways of understanding how one's mood and health are intimately connected. Developing a healthy outlook can help build positive relationships and connections with others. Patients will understand the importance of utilizing effective communication skills that include actively listening and being heard. They will learn and understand the importance of the "4 Cs" and especially Connections in fostering of a Healthy Mind-Set.  Healthy Sleep for a Healthy Heart Clinical staff led group instruction and group discussion with PowerPoint presentation and patient guidebook. To enhance the learning environment the use of posters, models and videos may be added. At the conclusion of this workshop, patients will be able to demonstrate  knowledge of the importance of sleep to overall health, well-being, and quality of life. They will understand the symptoms of, and treatments for, common sleep disorders. Patients will also be able to identify daytime and nighttime behaviors which impact sleep, and they will be able to apply these tools to help manage sleep-related challenges. The purpose of this lesson is to provide patients with a general overview of sleep and outline the importance of quality sleep. Patients will learn about a few of the most common sleep disorders. Patients will also be introduced to the concept of "sleep hygiene," and discover ways to self-manage certain  sleeping problems through simple daily behavior changes. Finally, the workshop will motivate patients by clarifying the links between quality sleep and their goals of heart-healthy living.   Recognizing and Reducing Stress Clinical staff led group instruction and group discussion with PowerPoint presentation and patient guidebook. To enhance the learning environment the use of posters, models and videos may be added. At the conclusion of this workshop, patients will be able to understand the types of stress reactions, differentiate between acute and chronic stress, and recognize the impact that chronic stress has on their health. They will also be able to apply different coping mechanisms, such as reframing negative self-talk. Patients will have the opportunity to practice a variety of stress management techniques, such as deep abdominal breathing, progressive muscle relaxation, and/or guided imagery.  The purpose of this lesson is to educate patients on the role of stress in their lives and to provide healthy techniques for coping with it.  Learning Barriers/Preferences:  Learning Barriers/Preferences - 01/11/22 1014       Learning Barriers/Preferences   Learning Barriers Sight   wears glasses-   Learning Preferences Audio;Written Material;Computer/Internet;Group  Instruction;Individual Instruction;Pictoral;Skilled Demonstration;Verbal Instruction;Video             Education Topics:  Knowledge Questionnaire Score:  Knowledge Questionnaire Score - 01/11/22 1033       Knowledge Questionnaire Score   Pre Score 23/24             Core Components/Risk Factors/Patient Goals at Admission:  Personal Goals and Risk Factors at Admission - 01/11/22 1012       Core Components/Risk Factors/Patient Goals on Admission    Weight Management Yes;Obesity;Weight Loss    Intervention Weight Management: Develop a combined nutrition and exercise program designed to reach desired caloric intake, while maintaining appropriate intake of nutrient and fiber, sodium and fats, and appropriate energy expenditure required for the weight goal.;Weight Management: Provide education and appropriate resources to help participant work on and attain dietary goals.;Weight Management/Obesity: Establish reasonable short term and long term weight goals.;Obesity: Provide education and appropriate resources to help participant work on and attain dietary goals.    Admit Weight 204 lb 9.4 oz (92.8 kg)    Goal Weight: Long Term 170 lb (77.1 kg)   Pt goal   Hypertension Yes    Intervention Provide education on lifestyle modifcations including regular physical activity/exercise, weight management, moderate sodium restriction and increased consumption of fresh fruit, vegetables, and low fat dairy, alcohol moderation, and smoking cessation.;Monitor prescription use compliance.    Expected Outcomes Short Term: Continued assessment and intervention until BP is < 140/20m HG in hypertensive participants. < 130/896mHG in hypertensive participants with diabetes, heart failure or chronic kidney disease.;Long Term: Maintenance of blood pressure at goal levels.    Lipids Yes    Intervention Provide education and support for participant on nutrition & aerobic/resistive exercise along with prescribed  medications to achieve LDL '70mg'$ , HDL >'40mg'$ .    Expected Outcomes Long Term: Cholesterol controlled with medications as prescribed, with individualized exercise RX and with personalized nutrition plan. Value goals: LDL < '70mg'$ , HDL > 40 mg.;Short Term: Participant states understanding of desired cholesterol values and is compliant with medications prescribed. Participant is following exercise prescription and nutrition guidelines.    Stress Yes    Intervention Offer individual and/or small group education and counseling on adjustment to heart disease, stress management and health-related lifestyle change. Teach and support self-help strategies.;Refer participants experiencing significant psychosocial distress to appropriate mental health specialists for  further evaluation and treatment. When possible, include family members and significant others in education/counseling sessions.    Expected Outcomes Short Term: Participant demonstrates changes in health-related behavior, relaxation and other stress management skills, ability to obtain effective social support, and compliance with psychotropic medications if prescribed.;Long Term: Emotional wellbeing is indicated by absence of clinically significant psychosocial distress or social isolation.             Core Components/Risk Factors/Patient Goals Review:    Core Components/Risk Factors/Patient Goals at Discharge (Final Review):    ITP Comments:  ITP Comments     Row Name 01/11/22 0907           ITP Comments Dr Fransico Him MD, Medical Director. Introduction to Pritikin Education Program/ Intensive Cardiac Rehab. Initial orientation packet reviewed with the patient                Comments: Participant attended orientation for the cardiac rehabilitation program on  01/11/2022  to perform initial intake and exercise walk test. Patient introduced to the [place] education and orientation packet was reviewed. Completed 6-minute walk  test, measurements, initial ITP, and exercise prescription. Vital signs stable. Telemetry-normal sinus rhythm, asymptomatic. 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 denied chest pain or angina during walk test.Rashi Granier Venetia Maxon, RN,BSN 01/11/2022 11:08 AM   Service time was from 0826 to 1020.

## 2022-01-12 LAB — URINE CULTURE
MICRO NUMBER:: 14401910
SPECIMEN QUALITY:: ADEQUATE

## 2022-01-17 ENCOUNTER — Encounter (HOSPITAL_COMMUNITY)
Admission: RE | Admit: 2022-01-17 | Discharge: 2022-01-17 | Disposition: A | Discharge status: Home / Self Care | Payer: BC Managed Care – PPO | Source: Ambulatory Visit | Attending: Internal Medicine | Admitting: Internal Medicine

## 2022-01-17 DIAGNOSIS — I2089 Other forms of angina pectoris: Secondary | ICD-10-CM

## 2022-01-17 NOTE — Progress Notes (Signed)
Daily Session Note  Patient Details  Name: Manuel Barker MRN: [id number] Date of Birth: 12-05-1950 Referring Provider:   Flowsheet Row INTENSIVE CARDIAC REHAB ORIENT from 01/11/2022 in [facility] [employer]  Referring Provider [name], MD       Encounter Date: 01/17/2022  Check In:  Session Check In - 01/17/22 1503       Check-In   Supervising physician immediately available to respond to emergencies [facility] - Physician supervision    Physician(s) Manuel Pancoast, NP    Location [facility]    Staff Present Barnet Pall, RN, Rico Ala, MS, Exercise Physiologist;David Lilyan Punt, MS, ACSM-CEP, CCRP, Exercise Physiologist;Jetta Gilford Rile BS, ACSM-CEP, Exercise Physiologist;Olinty Celesta Aver, MS, ACSM-CEP, Exercise Physiologist    Virtual Visit No    Medication changes reported     No    Fall or balance concerns reported    No    Tobacco Cessation No Change    Current number of cigarettes/nicotine per day     0    Warm-up and Cool-down Performed as group-led instruction    Resistance Training Performed Yes    VAD Patient? No    PAD/SET Patient? No      Pain Assessment   Currently in Pain? No/denies    Pain Score 0-No pain    Multiple Pain Sites No             Capillary Blood Glucose: No results found for this or any previous visit (from the past 24 hour(s)).   Exercise Prescription Changes - 01/17/22 1631       Response to Exercise   Blood Pressure (Admit) 110/70    Blood Pressure (Exercise) 136/62    Blood Pressure (Exit) 106/70    Heart Rate (Admit) 61 bpm    Heart Rate (Exercise) 119 bpm    Heart Rate (Exit) 71 bpm    Rating of Perceived Exertion (Exercise) 9    Perceived Dyspnea (Exercise) 0    Symptoms 0    Comments Pt first day in the CRP2 program    Duration Progress to 30 minutes of  aerobic without signs/symptoms of physical distress    Intensity THRR unchanged      Progression    Progression Continue to progress workloads to maintain intensity without signs/symptoms of physical distress.    Average METs 2.85      Resistance Training   Training Prescription Yes    Weight 3 lbs    Reps 10-15    Time 10 Minutes      NuStep   Level 1    SPM 86    Minutes 15    METs 2.4      Track   Laps 18    Minutes 15    METs 3.3             Social History   Tobacco Use  Smoking Status Never  Smokeless Tobacco Never    Goals Met:  Exercise tolerated well No report of concerns or symptoms today Strength training completed today  Goals Unmet:  Not Applicable  Comments: Pt started cardiac rehab today.  Pt tolerated light exercise without difficulty. VSS, telemetry-Sinus Rhythm, [name] reported having 2 twinges in his chest area that feel like a muscle spasm during ambulation today. Denied having nausea or chest pain asymptomatic.Manuel Pancoast NP, provider on site shown today's ECG tracings and vital signs. No new order received.    Medication list reconciled. Pt  denies barriers to medicaiton compliance.  PSYCHOSOCIAL ASSESSMENT:  PHQ-1. Pt exhibits positive coping skills, hopeful outlook with supportive family. No psychosocial needs identified at this time, no psychosocial interventions necessary.    Pt enjoys school work and spending time on the computer..   Pt oriented to exercise equipment and routine.    Understanding verbalized.Manuel Gave RN BSN    Dr. Fransico Him is Medical Director for Cardiac Rehab at [facility].

## 2022-01-19 ENCOUNTER — Encounter (HOSPITAL_COMMUNITY)
Admission: RE | Admit: 2022-01-19 | Discharge: 2022-01-19 | Disposition: A | Discharge status: Home / Self Care | Payer: BC Managed Care – PPO | Source: Ambulatory Visit | Attending: Internal Medicine | Admitting: Internal Medicine

## 2022-01-19 DIAGNOSIS — I2089 Other forms of angina pectoris: Secondary | ICD-10-CM | POA: Diagnosis not present

## 2022-01-21 ENCOUNTER — Encounter (HOSPITAL_COMMUNITY)
Admission: RE | Admit: 2022-01-21 | Discharge: 2022-01-21 | Disposition: A | Discharge status: Home / Self Care | Payer: BC Managed Care – PPO | Source: Ambulatory Visit | Attending: Internal Medicine | Admitting: Internal Medicine

## 2022-01-21 DIAGNOSIS — I2089 Other forms of angina pectoris: Secondary | ICD-10-CM

## 2022-01-24 ENCOUNTER — Encounter (HOSPITAL_COMMUNITY)
Admission: RE | Admit: 2022-01-24 | Discharge: 2022-01-24 | Disposition: A | Discharge status: Home / Self Care | Payer: BC Managed Care – PPO | Source: Ambulatory Visit | Attending: Internal Medicine | Admitting: Internal Medicine

## 2022-01-24 DIAGNOSIS — I2089 Other forms of angina pectoris: Secondary | ICD-10-CM

## 2022-01-25 ENCOUNTER — Other Ambulatory Visit: Payer: Self-pay | Admitting: Internal Medicine

## 2022-01-26 ENCOUNTER — Encounter (HOSPITAL_COMMUNITY)
Admission: RE | Admit: 2022-01-26 | Discharge: 2022-01-26 | Disposition: A | Discharge status: Home / Self Care | Payer: BC Managed Care – PPO | Source: Ambulatory Visit | Attending: Internal Medicine | Admitting: Internal Medicine

## 2022-01-26 DIAGNOSIS — I2089 Other forms of angina pectoris: Secondary | ICD-10-CM | POA: Diagnosis not present

## 2022-01-28 ENCOUNTER — Encounter (HOSPITAL_COMMUNITY)
Admission: RE | Admit: 2022-01-28 | Discharge: 2022-01-28 | Disposition: A | Discharge status: Home / Self Care | Payer: BC Managed Care – PPO | Source: Ambulatory Visit | Attending: Internal Medicine | Admitting: Internal Medicine

## 2022-01-28 DIAGNOSIS — I2089 Other forms of angina pectoris: Secondary | ICD-10-CM | POA: Diagnosis not present

## 2022-01-31 ENCOUNTER — Encounter (HOSPITAL_COMMUNITY)
Admission: RE | Admit: 2022-01-31 | Discharge: 2022-01-31 | Disposition: A | Discharge status: Home / Self Care | Payer: BC Managed Care – PPO | Source: Ambulatory Visit | Attending: Internal Medicine

## 2022-01-31 DIAGNOSIS — I2089 Other forms of angina pectoris: Secondary | ICD-10-CM | POA: Diagnosis not present
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
## 2022-02-02 ENCOUNTER — Encounter (HOSPITAL_COMMUNITY)
Admission: RE | Admit: 2022-02-02 | Discharge: 2022-02-02 | Disposition: A | Discharge status: Home / Self Care | Payer: BC Managed Care – PPO | Source: Ambulatory Visit | Attending: Internal Medicine

## 2022-02-02 DIAGNOSIS — I2089 Other forms of angina pectoris: Secondary | ICD-10-CM | POA: Diagnosis not present

## 2022-02-02 NOTE — Progress Notes (Signed)
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 guidebook.  Patient learns what metabolic syndrome is, how it leads to heart disease, and how one can reverse it and keep it from coming back. You have metabolic syndrome if you have 3 of the following 5 criteria: abdominal obesity, high blood pressure, high triglycerides, low HDL cholesterol, and high blood sugar.  Hypertension and Heart Disease Clinical staff conducted group or individual video  education with verbal and written material and guidebook.  Patient learns that high blood pressure, or hypertension, is very common in the [place]. Hypertension is largely due to excessive salt intake, but other important risk factors include being overweight, physical inactivity, drinking too much alcohol, smoking, and not eating enough potassium from fruits and vegetables. High blood pressure is a leading risk factor for heart attack, stroke, congestive heart failure, dementia, kidney failure, and premature death. Long-term effects of excessive salt intake include stiffening of the arteries and thickening of heart muscle and organ damage. Recommendations include ways to reduce hypertension and the risk of heart disease.  Diseases of Our Time - Focusing on Diabetes Clinical staff conducted group or individual video education with verbal and written material and guidebook.  Patient learns why the best way to stop diseases of our time is prevention, through food and other lifestyle changes. Medicine (such as prescription pills and surgeries) is often only a Band-Aid on the problem, not a long-term solution. Most common diseases of our time include obesity, type 2 diabetes, hypertension, heart disease, and cancer. The Pritikin Program is recommended and has been proven to help reduce, reverse, and/or prevent the damaging effects of metabolic syndrome.  Nutrition   Overview of the Pritikin Eating Plan  Clinical staff conducted group or individual video education with verbal and written material and guidebook.  Patient learns about the [place] for disease risk reduction. The [place] emphasizes a wide variety of unrefined, minimally-processed carbohydrates, like fruits, vegetables, whole grains, and legumes. Go, Caution, and Stop food choices are explained. Plant-based and lean animal proteins are emphasized. Rationale provided for low sodium intake for blood pressure control,  low added sugars for blood sugar stabilization, and low added fats and oils for coronary artery disease risk reduction and weight management.  Calorie Density  Clinical staff conducted group or individual video education with verbal and written material and guidebook.  Patient learns about calorie density and how it impacts the Pritikin Eating Plan. Knowing the characteristics of the food you choose will help you decide whether those foods will lead to weight gain or weight loss, and whether you want to consume more or less of them. Weight loss is usually a side effect of the Pritikin Eating Plan because of its focus on low calorie-dense foods.  Label Reading  Clinical staff conducted group or individual video education with verbal and written material and guidebook.  Patient learns about the Pritikin recommended label reading guidelines  and corresponding recommendations regarding calorie density, added sugars, sodium content, and whole grains.  Dining Out - Part 1  Clinical staff conducted group or individual video education with verbal and written material and guidebook.  Patient learns that restaurant meals can be sabotaging because they can be so high in calories, fat, sodium, and/or sugar. Patient learns recommended strategies on how to positively address this and avoid unhealthy pitfalls.  Facts on Fats  Clinical staff conducted group or individual video education with verbal and written material and guidebook.  Patient learns that lifestyle modifications can be just as effective, if not more so, as many medications for lowering your risk of heart disease. A Pritikin lifestyle can help to reduce your risk of inflammation and atherosclerosis (cholesterol build-up, or plaque, in the artery walls). Lifestyle interventions such as dietary choices and physical activity address the cause of atherosclerosis. A review of the types of fats and their impact on blood cholesterol levels, along with dietary  recommendations to reduce fat intake is also included.  Nutrition Action Plan  Clinical staff conducted group or individual video education with verbal and written material and guidebook.  Patient learns how to incorporate Pritikin recommendations into their lifestyle. Recommendations include planning and keeping personal health goals in mind as an important part of their success.  Healthy Mind-Set    Healthy Minds, Bodies, Hearts  Clinical staff conducted group or individual video education with verbal and written material and guidebook.  Patient learns how to identify when they are stressed. Video will discuss the impact of that stress, as well as the many benefits of stress management. Patient will also be introduced to stress management techniques. The way we think, act, and feel has an impact on our hearts.  How Our Thoughts Can Heal Our Hearts  Clinical staff conducted group or individual video education with verbal and written material and guidebook.  Patient learns that negative thoughts can cause depression and anxiety. This can result in negative lifestyle behavior and serious health problems. Cognitive behavioral therapy is an effective method to help control our thoughts in order to change and improve our emotional outlook.  Additional Videos:  Exercise    Improving Performance  Clinical staff conducted group or individual video education with verbal and written material and guidebook.  Patient learns to use a non-linear approach by alternating intensity levels and lengths of time spent exercising to help burn more calories and lose more body fat. Cardiovascular exercise helps improve heart health, metabolism, hormonal balance, blood sugar control, and recovery from fatigue. Resistance training improves strength, endurance, balance, coordination, reaction time, metabolism, and muscle mass. Flexibility exercise improves circulation, posture, and balance. Seek guidance from your  physician and exercise physiologist before implementing an exercise routine and learn your capabilities and proper form for all exercise.  Introduction to Yoga  Clinical staff conducted group or individual video education with verbal and written material and guidebook.  Patient learns about yoga, a discipline of the coming together of mind, breath, and body. The benefits of yoga include improved flexibility, improved range of motion, better posture and core strength, increased lung function, weight loss, and positive self-image. Yoga's heart health benefits include lowered blood pressure, healthier heart rate, decreased cholesterol and triglyceride levels, improved immune function, and reduced stress. Seek guidance from your physician and exercise physiologist before implementing an exercise routine and learn your capabilities and proper form for all exercise.  Medical   Aging: Enhancing Your Quality of Life  Clinical staff conducted group  or individual video education with verbal and written material and guidebook.  Patient learns key strategies and recommendations to stay in good physical health and enhance quality of life, such as prevention strategies, having an advocate, securing a [place], and keeping a list of medications and system for tracking them. It also discusses how to avoid risk for bone loss.  Biology of Weight Control  Clinical staff conducted group or individual video education with verbal and written material and guidebook.  Patient learns that weight gain occurs because we consume more calories than we burn (eating more, moving less). Even if your body weight is normal, you may have higher ratios of fat compared to muscle mass. Too much body fat puts you at increased risk for cardiovascular disease, heart attack, stroke, type 2 diabetes, and obesity-related cancers. In addition to exercise, following the [place] can help reduce your  risk.  Decoding Lab Results  Clinical staff conducted group or individual video education with verbal and written material and guidebook.  Patient learns that lab test reflects one measurement whose values change over time and are influenced by many factors, including medication, stress, sleep, exercise, food, hydration, pre-existing medical conditions, and more. It is recommended to use the knowledge from this video to become more involved with your lab results and evaluate your numbers to speak with your doctor.   Diseases of Our Time - Overview  Clinical staff conducted group or individual video education with verbal and written material and guidebook.  Patient learns that according to the CDC, 50% to 70% of chronic diseases (such as obesity, type 2 diabetes, elevated lipids, hypertension, and heart disease) are avoidable through lifestyle improvements including healthier food choices, listening to satiety cues, and increased physical activity.  Sleep Disorders Clinical staff conducted group or individual video education with verbal and written material and guidebook.  Patient learns how good quality and duration of sleep are important to overall health and well-being. Patient also learns about sleep disorders and how they impact health along with recommendations to address them, including discussing with a physician.  Nutrition  Dining Out - Part 2 Clinical staff conducted group or individual video education with verbal and written material and guidebook.  Patient learns how to plan ahead and communicate in order to maximize their dining experience in a healthy and nutritious manner. Included are recommended food choices based on the type of restaurant the patient is visiting.   Fueling a Best boy conducted group or individual video education with verbal and written material and guidebook.  There is a strong connection between our food choices and our health. Diseases  like obesity and type 2 diabetes are very prevalent and are in large-part due to lifestyle choices. The Pritikin Eating Plan provides plenty of food and hunger-curbing satisfaction. It is easy to follow, affordable, and helps reduce health risks.  Menu Workshop  Clinical staff conducted group or individual video education with verbal and written material and guidebook.  Patient learns that restaurant meals can sabotage health goals because they are often packed with calories, fat, sodium, and sugar. Recommendations include strategies to plan ahead and to communicate with the manager, chef, or server to help order a healthier meal.  Planning Your Eating Strategy  Clinical staff conducted group or individual video education with verbal and written material and guidebook.  Patient learns about the [place] and its benefit of reducing the risk of disease. The Pritikin  Eating Plan does not focus on calories. Instead, it emphasizes high-quality, nutrient-rich foods. By knowing the characteristics of the foods, we choose, we can determine their calorie density and make informed decisions.  Targeting Your Nutrition Priorities  Clinical staff conducted group or individual video education with verbal and written material and guidebook.  Patient learns that lifestyle habits have a tremendous impact on disease risk and progression. This video provides eating and physical activity recommendations based on your personal health goals, such as reducing LDL cholesterol, losing weight, preventing or controlling type 2 diabetes, and reducing high blood pressure.  Vitamins and Minerals  Clinical staff conducted group or individual video education with verbal and written material and guidebook.  Patient learns different ways to obtain key vitamins and minerals, including through a recommended healthy diet. It is important to discuss all supplements you take with your doctor.   Healthy Mind-Set    Smoking  Cessation  Clinical staff conducted group or individual video education with verbal and written material and guidebook.  Patient learns that cigarette smoking and tobacco addiction pose a serious health risk which affects millions of people. Stopping smoking will significantly reduce the risk of heart disease, lung disease, and many forms of cancer. Recommended strategies for quitting are covered, including working with your doctor to develop a successful plan.  Culinary   Becoming a Financial trader conducted group or individual video education with verbal and written material and guidebook.  Patient learns that cooking at home can be healthy, cost-effective, quick, and puts them in control. Keys to cooking healthy recipes will include looking at your recipe, assessing your equipment needs, planning ahead, making it simple, choosing cost-effective seasonal ingredients, and limiting the use of added fats, salts, and sugars.  Cooking - Breakfast and Snacks  Clinical staff conducted group or individual video education with verbal and written material and guidebook.  Patient learns how important breakfast is to satiety and nutrition through the entire day. Recommendations include key foods to eat during breakfast to help stabilize blood sugar levels and to prevent overeating at meals later in the day. Planning ahead is also a key component.  Cooking - Human resources officer conducted group or individual video education with verbal and written material and guidebook.  Patient learns eating strategies to improve overall health, including an approach to cook more at home. Recommendations include thinking of animal protein as a side on your plate rather than center stage and focusing instead on lower calorie dense options like vegetables, fruits, whole grains, and plant-based proteins, such as beans. Making sauces in large quantities to freeze for later and leaving the skin on your  vegetables are also recommended to maximize your experience.  Cooking - Healthy Salads and Dressing Clinical staff conducted group or individual video education with verbal and written material and guidebook.  Patient learns that vegetables, fruits, whole grains, and legumes are the foundations of the [place]. Recommendations include how to incorporate each of these in flavorful and healthy salads, and how to create homemade salad dressings. Proper handling of ingredients is also covered. Cooking - Soups and [employer] - Soups and Desserts Clinical staff conducted group or individual video education with verbal and written material and guidebook.  Patient learns that Pritikin soups and desserts make for easy, nutritious, and delicious snacks and meal components that are low in sodium, fat, sugar, and calorie density, while high in vitamins, minerals, and filling fiber. Recommendations include simple  and healthy ideas for soups and desserts.   Overview     The Pritikin Solution Program Overview Clinical staff conducted group or individual video education with verbal and written material and guidebook.  Patient learns that the results of the [place] Program have been documented in more than 100 articles published in peer-reviewed journals, and the benefits include reducing risk factors for (and, in some cases, even reversing) high cholesterol, high blood pressure, type 2 diabetes, obesity, and more! An overview of the three key pillars of the Pritikin Program will be covered: eating well, doing regular exercise, and having a healthy mind-set.  WORKSHOPS  Exercise: Exercise Basics: Building Your Action Plan Clinical staff led group instruction and group discussion with PowerPoint presentation and patient guidebook. To enhance the learning environment the use of posters, models and videos may be added. At the conclusion of this workshop, patients will comprehend the  difference between physical activity and exercise, as well as the benefits of incorporating both, into their routine. Patients will understand the FITT (Frequency, Intensity, Time, and Type) principle and how to use it to build an exercise action plan. In addition, safety concerns and other considerations for exercise and cardiac rehab will be addressed by the presenter. The purpose of this lesson is to promote a comprehensive and effective weekly exercise routine in order to improve patients' overall level of fitness.   Managing Heart Disease: Your Path to a Healthier Heart Clinical staff led group instruction and group discussion with PowerPoint presentation and patient guidebook. To enhance the learning environment the use of posters, models and videos may be added.At the conclusion of this workshop, patients will understand the anatomy and physiology of the heart. Additionally, they will understand how Pritikin's three pillars impact the risk factors, the progression, and the management of heart disease.  The purpose of this lesson is to provide a high-level overview of the heart, heart disease, and how the Pritikin lifestyle positively impacts risk factors.  Exercise Biomechanics Clinical staff led group instruction and group discussion with PowerPoint presentation and patient guidebook. To enhance the learning environment the use of posters, models and videos may be added. Patients will learn how the structural parts of their bodies function and how these functions impact their daily activities, movement, and exercise. Patients will learn how to promote a neutral spine, learn how to manage pain, and identify ways to improve their physical movement in order to promote healthy living. The purpose of this lesson is to expose patients to common physical limitations that impact physical activity. Participants will learn practical ways to adapt and manage aches and pains, and to minimize their  effect on regular exercise. Patients will learn how to maintain good posture while sitting, walking, and lifting.  Balance Training and Fall Prevention  Clinical staff led group instruction and group discussion with PowerPoint presentation and patient guidebook. To enhance the learning environment the use of posters, models and videos may be added. At the conclusion of this workshop, patients will understand the importance of their sensorimotor skills (vision, proprioception, and the vestibular system) in maintaining their ability to balance as they age. Patients will apply a variety of balancing exercises that are appropriate for their current level of function. Patients will understand the common causes for poor balance, possible solutions to these problems, and ways to modify their physical environment in order to minimize their fall risk. The purpose of this lesson is to teach patients about the importance of maintaining balance as they age and  ways to minimize their risk of falling.  WORKSHOPS   Nutrition:  Fueling a Scientist, research (physical sciences) led group instruction and group discussion with PowerPoint presentation and patient guidebook. To enhance the learning environment the use of posters, models and videos may be added. Patients will review the foundational principles of the [place] and understand what constitutes a serving size in each of the food groups. Patients will also learn Pritikin-friendly foods that are better choices when away from home and review make-ahead meal and snack options. Calorie density will be reviewed and applied to three nutrition priorities: weight maintenance, weight loss, and weight gain. The purpose of this lesson is to reinforce (in a group setting) the key concepts around what patients are recommended to eat and how to apply these guidelines when away from home by planning and selecting Pritikin-friendly options. Patients will understand how calorie  density may be adjusted for different weight management goals.  Mindful Eating  Clinical staff led group instruction and group discussion with PowerPoint presentation and patient guidebook. To enhance the learning environment the use of posters, models and videos may be added. Patients will briefly review the concepts of the [place] and the importance of low-calorie dense foods. The concept of mindful eating will be introduced as well as the importance of paying attention to internal hunger signals. Triggers for non-hunger eating and techniques for dealing with triggers will be explored. The purpose of this lesson is to provide patients with the opportunity to review the basic principles of the [place], discuss the value of eating mindfully and how to measure internal cues of hunger and fullness using the Hunger Scale. Patients will also discuss reasons for non-hunger eating and learn strategies to use for controlling emotional eating.  Targeting Your Nutrition Priorities Clinical staff led group instruction and group discussion with PowerPoint presentation and patient guidebook. To enhance the learning environment the use of posters, models and videos may be added. Patients will learn how to determine their genetic susceptibility to disease by reviewing their family history. Patients will gain insight into the importance of diet as part of an overall healthy lifestyle in mitigating the impact of genetics and other environmental insults. The purpose of this lesson is to provide patients with the opportunity to assess their personal nutrition priorities by looking at their family history, their own health history and current risk factors. Patients will also be able to discuss ways of prioritizing and modifying the [place] for their highest risk areas  Menu  Clinical staff led group instruction and group discussion with PowerPoint presentation and patient guidebook. To  enhance the learning environment the use of posters, models and videos may be added. Using menus brought in from [employer], or printed from [employer], patients will apply the [place] dining out guidelines that were presented in the [employer] video. Patients will also be able to practice these guidelines in a variety of provided scenarios. The purpose of this lesson is to provide patients with the opportunity to practice hands-on learning of the [place] with actual menus and practice scenarios.  Label Reading Clinical staff led group instruction and group discussion with PowerPoint presentation and patient guidebook. To enhance the learning environment the use of posters, models and videos may be added. Patients will review and discuss the Pritikin label reading guidelines presented in Pritikin's Label Reading Educational series video. Using fool labels brought in from local grocery stores and  markets, patients will apply the label reading guidelines and determine if the packaged food meet the Pritikin guidelines. The purpose of this lesson is to provide patients with the opportunity to review, discuss, and practice hands-on learning of the Pritikin Label Reading guidelines with actual packaged food labels. [place] Workshops are designed to teach patients ways to prepare quick, simple, and affordable recipes at home. The importance of nutrition's role in chronic disease risk reduction is reflected in its emphasis in the overall Pritikin program. By learning how to prepare essential core Pritikin Eating Plan recipes, patients will increase control over what they eat; be able to customize the flavor of foods without the use of added salt, sugar, or fat; and improve the quality of the food they consume. By learning a set of core recipes which are easily assembled, quickly prepared, and affordable, patients are more likely to  prepare more healthy foods at home. These workshops focus on convenient breakfasts, simple entres, side dishes, and desserts which can be prepared with minimal effort and are consistent with nutrition recommendations for cardiovascular risk reduction. Cooking [employer] are taught by a Engineer, materials (RD) who has been trained by the [employer]. The chef or RD has a clear understanding of the importance of minimizing - if not completely eliminating - added fat, sugar, and sodium in recipes. Throughout the series of [place] Workshop sessions, patients will learn about healthy ingredients and efficient methods of cooking to build confidence in their capability to prepare    Cooking School weekly topics:  Adding Flavor- Sodium-Free  Fast and Healthy Breakfasts  Powerhouse Plant-Based Proteins  Satisfying Salads and Dressings  Simple Sides and Sauces  International Cuisine-Spotlight on the [place] Zones  Delicious Desserts  Savory Soups  Efficiency Cooking - Meals in a Snap  Tasty Appetizers and Snacks  Comforting Weekend Breakfasts  One-Pot Wonders   Fast Evening Meals  Easy [facility] (Psychosocial): New Thoughts, New Behaviors Clinical staff led group instruction and group discussion with PowerPoint presentation and patient guidebook. To enhance the learning environment the use of posters, models and videos may be added. Patients will learn and practice techniques for developing effective health and lifestyle goals. Patients will be able to effectively apply the goal setting process learned to develop at least one new personal goal.  The purpose of this lesson is to expose patients to a new skill set of behavior modification techniques such as techniques setting SMART goals, overcoming barriers, and achieving new thoughts and new behaviors.  Managing Moods and Relationships Clinical  staff led group instruction and group discussion with PowerPoint presentation and patient guidebook. To enhance the learning environment the use of posters, models and videos may be added. Patients will learn how emotional and chronic stress factors can impact their health and relationships. They will learn healthy ways to manage their moods and utilize positive coping mechanisms. In addition, ICR patients will learn ways to improve communication skills. The purpose of this lesson is to expose patients to ways of understanding how one's mood and health are intimately connected. Developing a healthy outlook can help build positive relationships and connections with others. Patients will understand the importance of utilizing effective communication skills that include actively listening and being heard. They will learn and understand the importance of the "4 Cs" and especially Connections in fostering of a Healthy Mind-Set.  Healthy Sleep for a  Healthy Heart Clinical staff led group instruction and group discussion with PowerPoint presentation and patient guidebook. To enhance the learning environment the use of posters, models and videos may be added. At the conclusion of this workshop, patients will be able to demonstrate knowledge of the importance of sleep to overall health, well-being, and quality of life. They will understand the symptoms of, and treatments for, common sleep disorders. Patients will also be able to identify daytime and nighttime behaviors which impact sleep, and they will be able to apply these tools to help manage sleep-related challenges. The purpose of this lesson is to provide patients with a general overview of sleep and outline the importance of quality sleep. Patients will learn about a few of the most common sleep disorders. Patients will also be introduced to the concept of "sleep hygiene," and discover ways to self-manage certain sleeping problems through simple daily behavior  changes. Finally, the workshop will motivate patients by clarifying the links between quality sleep and their goals of heart-healthy living.   Recognizing and Reducing Stress Clinical staff led group instruction and group discussion with PowerPoint presentation and patient guidebook. To enhance the learning environment the use of posters, models and videos may be added. At the conclusion of this workshop, patients will be able to understand the types of stress reactions, differentiate between acute and chronic stress, and recognize the impact that chronic stress has on their health. They will also be able to apply different coping mechanisms, such as reframing negative self-talk. Patients will have the opportunity to practice a variety of stress management techniques, such as deep abdominal breathing, progressive muscle relaxation, and/or guided imagery.  The purpose of this lesson is to educate patients on the role of stress in their lives and to provide healthy techniques for coping with it.  Learning Barriers/Preferences:  Learning Barriers/Preferences - 01/11/22 1014       Learning Barriers/Preferences   Learning Barriers Sight   wears glasses-   Learning Preferences Audio;Written Material;Computer/Internet;Group Instruction;Individual Instruction;Pictoral;Skilled Demonstration;Verbal Instruction;Video             Education Topics:  Knowledge Questionnaire Score:  Knowledge Questionnaire Score - 01/11/22 1033       Knowledge Questionnaire Score   Pre Score 23/24             Core Components/Risk Factors/Patient Goals at Admission:  Personal Goals and Risk Factors at Admission - 01/11/22 1012       Core Components/Risk Factors/Patient Goals on Admission    Weight Management Yes;Obesity;Weight Loss    Intervention Weight Management: Develop a combined nutrition and exercise program designed to reach desired caloric intake, while maintaining appropriate intake of nutrient and  fiber, sodium and fats, and appropriate energy expenditure required for the weight goal.;Weight Management: Provide education and appropriate resources to help participant work on and attain dietary goals.;Weight Management/Obesity: Establish reasonable short term and long term weight goals.;Obesity: Provide education and appropriate resources to help participant work on and attain dietary goals.    Admit Weight 204 lb 9.4 oz (92.8 kg)    Goal Weight: Long Term 170 lb (77.1 kg)   Pt goal   Hypertension Yes    Intervention Provide education on lifestyle modifcations including regular physical activity/exercise, weight management, moderate sodium restriction and increased consumption of fresh fruit, vegetables, and low fat dairy, alcohol moderation, and smoking cessation.;Monitor prescription use compliance.    Expected Outcomes Short Term: Continued assessment and intervention until BP is < 140/15m HG in hypertensive participants. <  130/55m HG in hypertensive participants with diabetes, heart failure or chronic kidney disease.;Long Term: Maintenance of blood pressure at goal levels.    Lipids Yes    Intervention Provide education and support for participant on nutrition & aerobic/resistive exercise along with prescribed medications to achieve LDL '70mg'$ , HDL >'40mg'$ .    Expected Outcomes Long Term: Cholesterol controlled with medications as prescribed, with individualized exercise RX and with personalized nutrition plan. Value goals: LDL < '70mg'$ , HDL > 40 mg.;Short Term: Participant states understanding of desired cholesterol values and is compliant with medications prescribed. Participant is following exercise prescription and nutrition guidelines.    Stress Yes    Intervention Offer individual and/or small group education and counseling on adjustment to heart disease, stress management and health-related lifestyle change. Teach and support self-help strategies.;Refer participants experiencing significant  psychosocial distress to appropriate mental health specialists for further evaluation and treatment. When possible, include family members and significant others in education/counseling sessions.    Expected Outcomes Short Term: Participant demonstrates changes in health-related behavior, relaxation and other stress management skills, ability to obtain effective social support, and compliance with psychotropic medications if prescribed.;Long Term: Emotional wellbeing is indicated by absence of clinically significant psychosocial distress or social isolation.             Core Components/Risk Factors/Patient Goals Review:   Goals and Risk Factor Review     Row Name 01/18/22 0756 02/02/22 1521           Core Components/Risk Factors/Patient Goals Review   Personal Goals Review Weight Management/Obesity;Hypertension;Lipids;Stress Weight Management/Obesity;Hypertension;Lipids;Stress      Review FArlindstarted intensive cardiac rehab on 01/17/22 and did well with exercise. FDarroldid report having two episodes of a twinge in his chest denied actual pain.Onsite provider notified. Vital signs stable FNeymarhas been doing well with exercise at intensive cardiac rehab. FAmariohas not voiced any reports of his anginal eqivalent. Vital signs have been stable. Dr jJeneen Rinkshas lost 2.6 kg since starting the program      Expected Outcomes FMarkeewill continue to participate in intensive cardiac rehab for exercise, nutrition and lifestyle modifications FSherronwill continue to participate in intensive cardiac rehab for exercise, nutrition and lifestyle modifications               Core Components/Risk Factors/Patient Goals at Discharge (Final Review):   Goals and Risk Factor Review - 02/02/22 1521       Core Components/Risk Factors/Patient Goals Review   Personal Goals Review Weight Management/Obesity;Hypertension;Lipids;Stress    Review FDilrajhas been doing well with exercise at intensive cardiac rehab. FRajon has not voiced any reports of his anginal eqivalent. Vital signs have been stable. Dr jJeneen Rinkshas lost 2.6 kg since starting the program    Expected Outcomes FCamerinwill continue to participate in intensive cardiac rehab for exercise, nutrition and lifestyle modifications             ITP Comments:  ITP Comments     Row Name 01/11/22 0907 01/18/22 0746 02/02/22 1518       ITP Comments Dr TFransico HimMD, Medical Director. Introduction to [facility]/ Intensive Cardiac Rehab. Initial orientation packet reviewed with the patient 30 Day ITP Review. FJaecestarted intensive cardiac rehab on 01/18/22 and did well with exercise. 30 Day ITP Review. FGaineshas good attendance and participation in intensive cardiac rehab              Comments: See ITP comments.MHarrell GaveRN BSN

## 2022-02-04 ENCOUNTER — Encounter (INDEPENDENT_AMBULATORY_CARE_PROVIDER_SITE_OTHER): Payer: BC Managed Care – PPO | Admitting: Internal Medicine

## 2022-02-04 ENCOUNTER — Encounter: Payer: Self-pay | Admitting: Internal Medicine

## 2022-02-04 ENCOUNTER — Encounter (HOSPITAL_COMMUNITY)
Admission: RE | Admit: 2022-02-04 | Discharge: 2022-02-04 | Disposition: A | Discharge status: Home / Self Care | Payer: BC Managed Care – PPO | Source: Ambulatory Visit | Attending: Internal Medicine | Admitting: Internal Medicine

## 2022-02-04 VITALS — BP 120/66 | HR 64 | Ht 65.75 in | Wt 199.0 lb

## 2022-02-04 DIAGNOSIS — I10 Essential (primary) hypertension: Secondary | ICD-10-CM

## 2022-02-04 DIAGNOSIS — I25110 Atherosclerotic heart disease of native coronary artery with unstable angina pectoris: Secondary | ICD-10-CM | POA: Insufficient documentation

## 2022-02-04 DIAGNOSIS — Z79899 Other long term (current) drug therapy: Secondary | ICD-10-CM | POA: Diagnosis not present

## 2022-02-04 DIAGNOSIS — E782 Mixed hyperlipidemia: Secondary | ICD-10-CM | POA: Diagnosis not present

## 2022-02-04 DIAGNOSIS — I119 Hypertensive heart disease without heart failure: Secondary | ICD-10-CM | POA: Diagnosis not present

## 2022-02-04 DIAGNOSIS — I2089 Other forms of angina pectoris: Secondary | ICD-10-CM

## 2022-02-04 DIAGNOSIS — R079 Chest pain, unspecified: Secondary | ICD-10-CM | POA: Diagnosis not present

## 2022-02-04 DIAGNOSIS — I214 Non-ST elevation (NSTEMI) myocardial infarction: Secondary | ICD-10-CM | POA: Insufficient documentation

## 2022-02-04 NOTE — Patient Instructions (Addendum)
Medication Instructions:  Your physician recommends that you continue on your current medications as directed. Please refer to the Current Medication list given to you today.  *If you need a refill on your cardiac medications before your next appointment, please call your pharmacy*  Lab Work: Your physician recommends that you return for lab work in: CBC, BMET, TSH, Vitamin D, Lipo med panel  If you have labs (blood work) drawn today and your tests are completely normal, you will receive your results only by: [place] (if you have MyChart) OR A paper copy in the mail If you have any lab test that is abnormal or we need to change your treatment, we will call you to review the results.  Testing/Procedures: None ordered today.   Follow-Up: At [facility], you and your health needs are our priority.  As part of our continuing mission to provide you with exceptional heart care, we have created designated Provider Care Teams.  These Care Teams include your primary Cardiologist (physician) and Advanced Practice Providers (APPs -  Physician Assistants and Nurse Practitioners) who all work together to provide you with the care you need, when you need it.  We recommend signing up for the patient portal called "MyChart".  Sign up information is provided on this After Visit Summary.  MyChart is used to connect with patients for Virtual Visits (Telemedicine).  Patients are able to view lab/test results, encounter notes, upcoming appointments, etc.  Non-urgent messages can be sent to your provider as well.   To learn more about what you can do with MyChart, go to [email or web address].    Your next appointment:   9 month(s)  Provider:   [name], MD

## 2022-02-06 LAB — BASIC METABOLIC PANEL
BUN/Creatinine Ratio: 17 (ref 10–24)
BUN: 16 mg/dL (ref 8–27)
CO2: 19 mmol/L — ABNORMAL LOW (ref 20–29)
Calcium: 9.8 mg/dL (ref 8.6–10.2)
Chloride: 103 mmol/L (ref 96–106)
Creatinine, Ser: 0.95 mg/dL (ref 0.76–1.27)
Glucose: 84 mg/dL (ref 70–99)
Potassium: 4.4 mmol/L (ref 3.5–5.2)
Sodium: 138 mmol/L (ref 134–144)
eGFR: 86 mL/min/{1.73_m2} (ref >59)

## 2022-02-06 LAB — HEMOGLOBIN A1C
Est. average glucose Bld gHb Est-mCnc: 131 mg/dL
Hgb A1c MFr Bld: 6.2 % — ABNORMAL HIGH (ref 4.8–5.6)

## 2022-02-06 LAB — CBC
Hematocrit: 43.6 % (ref 37.5–51.0)
Hemoglobin: 14.5 g/dL (ref 13.0–17.7)
MCH: 29.2 pg (ref 26.6–33.0)
MCHC: 33.3 g/dL (ref 31.5–35.7)
MCV: 88 fL (ref 79–97)
Platelets: 196 10*3/uL (ref 150–450)
RBC: 4.97 x10E6/uL (ref 4.14–5.80)
RDW: 14.1 % (ref 11.6–15.4)
WBC: 4.5 10*3/uL (ref 3.4–10.8)

## 2022-02-06 LAB — NMR, LIPOPROFILE
Cholesterol, Total: 104 mg/dL (ref 100–199)
HDL Particle Number: 25.2 umol/L — ABNORMAL LOW (ref >=30.5)
HDL-C: 42 mg/dL (ref >39)
LDL Particle Number: 708 nmol/L (ref <1000)
LDL Size: 20.7 nm (ref >20.5)
LDL-C (NIH Calc): 49 mg/dL (ref 0–99)
LP-IR Score: 34 (ref <=45)
Small LDL Particle Number: 375 nmol/L (ref <=527)
Triglycerides: 54 mg/dL (ref 0–149)

## 2022-02-06 LAB — VITAMIN D 25 HYDROXY (VIT D DEFICIENCY, FRACTURES): Vit D, 25-Hydroxy: 42.4 ng/mL (ref 30.0–100.0)

## 2022-02-06 LAB — TSH: TSH: 0.988 u[IU]/mL (ref 0.450–4.500)

## 2022-02-07 ENCOUNTER — Encounter (HOSPITAL_COMMUNITY)
Admission: RE | Admit: 2022-02-07 | Discharge: 2022-02-07 | Disposition: A | Discharge status: Home / Self Care | Payer: BC Managed Care – PPO | Source: Ambulatory Visit | Attending: Internal Medicine

## 2022-02-07 ENCOUNTER — Other Ambulatory Visit: Payer: Self-pay

## 2022-02-07 DIAGNOSIS — I119 Hypertensive heart disease without heart failure: Secondary | ICD-10-CM | POA: Diagnosis not present

## 2022-02-07 DIAGNOSIS — R079 Chest pain, unspecified: Secondary | ICD-10-CM | POA: Diagnosis not present

## 2022-02-07 DIAGNOSIS — Z79899 Other long term (current) drug therapy: Secondary | ICD-10-CM | POA: Diagnosis not present

## 2022-02-07 DIAGNOSIS — I2089 Other forms of angina pectoris: Secondary | ICD-10-CM

## 2022-02-07 DIAGNOSIS — E782 Mixed hyperlipidemia: Secondary | ICD-10-CM | POA: Diagnosis not present

## 2022-02-07 DIAGNOSIS — I214 Non-ST elevation (NSTEMI) myocardial infarction: Secondary | ICD-10-CM | POA: Diagnosis not present

## 2022-02-07 DIAGNOSIS — I25110 Atherosclerotic heart disease of native coronary artery with unstable angina pectoris: Secondary | ICD-10-CM | POA: Diagnosis not present

## 2022-02-07 MED ORDER — CHOLECALCIFEROL 100 MCG (4000 UT) PO CAPS: 4000.0000 [IU] | ORAL_CAPSULE | Freq: Every day | ORAL | 3 refills | Status: AC

## 2022-02-09 ENCOUNTER — Encounter (HOSPITAL_COMMUNITY)
Admission: RE | Admit: 2022-02-09 | Discharge: 2022-02-09 | Disposition: A | Discharge status: Home / Self Care | Payer: BC Managed Care – PPO | Source: Ambulatory Visit | Attending: Internal Medicine | Admitting: Internal Medicine

## 2022-02-09 DIAGNOSIS — I25110 Atherosclerotic heart disease of native coronary artery with unstable angina pectoris: Secondary | ICD-10-CM | POA: Diagnosis not present

## 2022-02-09 DIAGNOSIS — R079 Chest pain, unspecified: Secondary | ICD-10-CM | POA: Diagnosis not present

## 2022-02-09 DIAGNOSIS — I119 Hypertensive heart disease without heart failure: Secondary | ICD-10-CM | POA: Diagnosis not present

## 2022-02-09 DIAGNOSIS — I2089 Other forms of angina pectoris: Secondary | ICD-10-CM

## 2022-02-09 DIAGNOSIS — I214 Non-ST elevation (NSTEMI) myocardial infarction: Secondary | ICD-10-CM | POA: Diagnosis not present

## 2022-02-09 DIAGNOSIS — E782 Mixed hyperlipidemia: Secondary | ICD-10-CM | POA: Diagnosis not present

## 2022-02-09 DIAGNOSIS — Z79899 Other long term (current) drug therapy: Secondary | ICD-10-CM | POA: Diagnosis not present

## 2022-02-11 ENCOUNTER — Encounter (HOSPITAL_COMMUNITY)
Admission: RE | Admit: 2022-02-11 | Discharge: 2022-02-11 | Disposition: A | Discharge status: Home / Self Care | Payer: BC Managed Care – PPO | Source: Ambulatory Visit | Attending: Internal Medicine

## 2022-02-11 DIAGNOSIS — I119 Hypertensive heart disease without heart failure: Secondary | ICD-10-CM | POA: Diagnosis not present

## 2022-02-11 DIAGNOSIS — I214 Non-ST elevation (NSTEMI) myocardial infarction: Secondary | ICD-10-CM | POA: Diagnosis not present

## 2022-02-11 DIAGNOSIS — E782 Mixed hyperlipidemia: Secondary | ICD-10-CM | POA: Diagnosis not present

## 2022-02-11 DIAGNOSIS — Z79899 Other long term (current) drug therapy: Secondary | ICD-10-CM | POA: Diagnosis not present

## 2022-02-11 DIAGNOSIS — I2089 Other forms of angina pectoris: Secondary | ICD-10-CM

## 2022-02-11 DIAGNOSIS — R079 Chest pain, unspecified: Secondary | ICD-10-CM | POA: Diagnosis not present

## 2022-02-11 DIAGNOSIS — I25110 Atherosclerotic heart disease of native coronary artery with unstable angina pectoris: Secondary | ICD-10-CM | POA: Diagnosis not present

## 2022-02-11 NOTE — Progress Notes (Signed)
CARDIAC REHAB PHASE 2  Reviewed home exercise with pt today. Pt is tolerating exercise well. Pt will continue to exercise on his own by walking for 30-45 minutes per session 3 days a week in addition to the 3 days in CRP2. Advised pt on THRR, RPE scale, hydration and temperature/humidity precautions. Reinforced NTG use, S/S to stop exercise and when to call MD vs 911. Encouraged warm up cool down and stretches with exercise sessions. Pt verbalized understanding, all questions were answered and pt was given a copy to take home.    Kirby Funk ACSM-CEP 02/11/2022 4:33 PM

## 2022-02-14 ENCOUNTER — Encounter (HOSPITAL_COMMUNITY)
Admission: RE | Admit: 2022-02-14 | Discharge: 2022-02-14 | Disposition: A | Discharge status: Home / Self Care | Payer: BC Managed Care – PPO | Source: Ambulatory Visit | Attending: Internal Medicine

## 2022-02-14 DIAGNOSIS — I2089 Other forms of angina pectoris: Secondary | ICD-10-CM

## 2022-02-14 DIAGNOSIS — I25110 Atherosclerotic heart disease of native coronary artery with unstable angina pectoris: Secondary | ICD-10-CM | POA: Diagnosis not present

## 2022-02-14 DIAGNOSIS — Z79899 Other long term (current) drug therapy: Secondary | ICD-10-CM | POA: Diagnosis not present

## 2022-02-14 DIAGNOSIS — I119 Hypertensive heart disease without heart failure: Secondary | ICD-10-CM | POA: Diagnosis not present

## 2022-02-14 DIAGNOSIS — I214 Non-ST elevation (NSTEMI) myocardial infarction: Secondary | ICD-10-CM | POA: Diagnosis not present

## 2022-02-14 DIAGNOSIS — R079 Chest pain, unspecified: Secondary | ICD-10-CM | POA: Diagnosis not present

## 2022-02-14 DIAGNOSIS — E782 Mixed hyperlipidemia: Secondary | ICD-10-CM | POA: Diagnosis not present

## 2022-02-16 ENCOUNTER — Encounter (HOSPITAL_COMMUNITY)
Admission: RE | Admit: 2022-02-16 | Discharge: 2022-02-16 | Disposition: A | Discharge status: Home / Self Care | Payer: BC Managed Care – PPO | Source: Ambulatory Visit | Attending: Internal Medicine

## 2022-02-16 DIAGNOSIS — E782 Mixed hyperlipidemia: Secondary | ICD-10-CM | POA: Diagnosis not present

## 2022-02-16 DIAGNOSIS — I214 Non-ST elevation (NSTEMI) myocardial infarction: Secondary | ICD-10-CM | POA: Diagnosis not present

## 2022-02-16 DIAGNOSIS — R079 Chest pain, unspecified: Secondary | ICD-10-CM | POA: Diagnosis not present

## 2022-02-16 DIAGNOSIS — Z79899 Other long term (current) drug therapy: Secondary | ICD-10-CM | POA: Diagnosis not present

## 2022-02-16 DIAGNOSIS — I119 Hypertensive heart disease without heart failure: Secondary | ICD-10-CM | POA: Diagnosis not present

## 2022-02-16 DIAGNOSIS — I2089 Other forms of angina pectoris: Secondary | ICD-10-CM

## 2022-02-16 DIAGNOSIS — I25110 Atherosclerotic heart disease of native coronary artery with unstable angina pectoris: Secondary | ICD-10-CM | POA: Diagnosis not present

## 2022-02-18 ENCOUNTER — Encounter (HOSPITAL_COMMUNITY)
Admission: RE | Admit: 2022-02-18 | Discharge: 2022-02-18 | Disposition: A | Discharge status: Home / Self Care | Payer: BC Managed Care – PPO | Source: Ambulatory Visit | Attending: Internal Medicine | Admitting: Internal Medicine

## 2022-02-18 DIAGNOSIS — R079 Chest pain, unspecified: Secondary | ICD-10-CM | POA: Diagnosis not present

## 2022-02-18 DIAGNOSIS — I25110 Atherosclerotic heart disease of native coronary artery with unstable angina pectoris: Secondary | ICD-10-CM | POA: Diagnosis not present

## 2022-02-18 DIAGNOSIS — I214 Non-ST elevation (NSTEMI) myocardial infarction: Secondary | ICD-10-CM | POA: Diagnosis not present

## 2022-02-18 DIAGNOSIS — Z79899 Other long term (current) drug therapy: Secondary | ICD-10-CM | POA: Diagnosis not present

## 2022-02-18 DIAGNOSIS — I2089 Other forms of angina pectoris: Secondary | ICD-10-CM

## 2022-02-18 DIAGNOSIS — E782 Mixed hyperlipidemia: Secondary | ICD-10-CM | POA: Diagnosis not present

## 2022-02-18 DIAGNOSIS — I119 Hypertensive heart disease without heart failure: Secondary | ICD-10-CM | POA: Diagnosis not present

## 2022-02-18 NOTE — Progress Notes (Signed)
4 beat run of Vtach noted. Patient asymptomatic. Rate 150. Patient walking on the walking track without symptoms. Blood pressure 122/72. Dr Harrington Challenger notified. Patient also reported having left chest tingling after walking 22 laps on the track. This resolved with rest. Dr Harrington Challenger also notified via instant message.  Dr Murray Hodgkins. I would continue to follow   Patient notified of Dr Alan Ripper response.  Harrell Gave RN BSN

## 2022-02-21 ENCOUNTER — Encounter (HOSPITAL_COMMUNITY)
Admission: RE | Admit: 2022-02-21 | Discharge: 2022-02-21 | Disposition: A | Discharge status: Home / Self Care | Payer: BC Managed Care – PPO | Source: Ambulatory Visit | Attending: Internal Medicine

## 2022-02-21 DIAGNOSIS — I214 Non-ST elevation (NSTEMI) myocardial infarction: Secondary | ICD-10-CM | POA: Diagnosis not present

## 2022-02-21 DIAGNOSIS — R079 Chest pain, unspecified: Secondary | ICD-10-CM | POA: Diagnosis not present

## 2022-02-21 DIAGNOSIS — I2089 Other forms of angina pectoris: Secondary | ICD-10-CM

## 2022-02-21 DIAGNOSIS — Z79899 Other long term (current) drug therapy: Secondary | ICD-10-CM | POA: Diagnosis not present

## 2022-02-21 DIAGNOSIS — I25110 Atherosclerotic heart disease of native coronary artery with unstable angina pectoris: Secondary | ICD-10-CM | POA: Diagnosis not present

## 2022-02-21 DIAGNOSIS — E782 Mixed hyperlipidemia: Secondary | ICD-10-CM | POA: Diagnosis not present

## 2022-02-21 DIAGNOSIS — I119 Hypertensive heart disease without heart failure: Secondary | ICD-10-CM | POA: Diagnosis not present

## 2022-02-23 ENCOUNTER — Encounter (HOSPITAL_COMMUNITY)
Admission: RE | Admit: 2022-02-23 | Discharge: 2022-02-23 | Disposition: A | Discharge status: Home / Self Care | Payer: BC Managed Care – PPO | Source: Ambulatory Visit | Attending: Internal Medicine | Admitting: Internal Medicine

## 2022-02-23 DIAGNOSIS — R079 Chest pain, unspecified: Secondary | ICD-10-CM | POA: Diagnosis not present

## 2022-02-23 DIAGNOSIS — E782 Mixed hyperlipidemia: Secondary | ICD-10-CM | POA: Diagnosis not present

## 2022-02-23 DIAGNOSIS — Z79899 Other long term (current) drug therapy: Secondary | ICD-10-CM | POA: Diagnosis not present

## 2022-02-23 DIAGNOSIS — I25110 Atherosclerotic heart disease of native coronary artery with unstable angina pectoris: Secondary | ICD-10-CM | POA: Diagnosis not present

## 2022-02-23 DIAGNOSIS — I214 Non-ST elevation (NSTEMI) myocardial infarction: Secondary | ICD-10-CM | POA: Diagnosis not present

## 2022-02-23 DIAGNOSIS — I119 Hypertensive heart disease without heart failure: Secondary | ICD-10-CM | POA: Diagnosis not present

## 2022-02-23 DIAGNOSIS — I2089 Other forms of angina pectoris: Secondary | ICD-10-CM

## 2022-02-25 ENCOUNTER — Encounter (HOSPITAL_COMMUNITY): Payer: BC Managed Care – PPO

## 2022-02-28 ENCOUNTER — Encounter (HOSPITAL_COMMUNITY)
Admission: RE | Admit: 2022-02-28 | Discharge: 2022-02-28 | Disposition: A | Discharge status: Home / Self Care | Payer: BC Managed Care – PPO | Source: Ambulatory Visit | Attending: Internal Medicine | Admitting: Internal Medicine

## 2022-02-28 DIAGNOSIS — E782 Mixed hyperlipidemia: Secondary | ICD-10-CM | POA: Diagnosis not present

## 2022-02-28 DIAGNOSIS — I214 Non-ST elevation (NSTEMI) myocardial infarction: Secondary | ICD-10-CM | POA: Diagnosis not present

## 2022-02-28 DIAGNOSIS — Z79899 Other long term (current) drug therapy: Secondary | ICD-10-CM | POA: Diagnosis not present

## 2022-02-28 DIAGNOSIS — I2089 Other forms of angina pectoris: Secondary | ICD-10-CM

## 2022-02-28 DIAGNOSIS — R079 Chest pain, unspecified: Secondary | ICD-10-CM | POA: Diagnosis not present

## 2022-02-28 DIAGNOSIS — I25110 Atherosclerotic heart disease of native coronary artery with unstable angina pectoris: Secondary | ICD-10-CM | POA: Diagnosis not present

## 2022-02-28 DIAGNOSIS — I119 Hypertensive heart disease without heart failure: Secondary | ICD-10-CM | POA: Diagnosis not present

## 2022-03-01 NOTE — Progress Notes (Signed)
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            Education: Education Goals: Education classes will be provided on a weekly basis, covering required topics. Participant will state understanding/return demonstration of topics presented.    Education     Row Name 01/17/22 1600     Education   Cardiac Education  Topics Pritikin   IT sales professional Nutrition   Nutrition Workshop Label Reading   Instruction Review Code 1- Information systems manager   Class Start Time 1400   Class Stop Time 1458   Class Time Calculation (min) 58 min    [place] Name 01/20/22 1000      Education   Cardiac Education Topics Pritikin   [employer]  completed 01/19/22     Cooking School   Educator Dietitian   Weekly Topic Delicious Desserts   Instruction Review Code 1- Information systems manager   Class Start Time 1400   Class Stop Time 1458   Class Time Calculation (min) 58 min    [place] Name 01/21/22 1500     Education   Cardiac Education Topics Pritikin   Lexicographer Nutrition   Nutrition Other  Label Reading   Instruction Review Code 1- Verbalizes Understanding   Class Start Time 1400   Class Stop Time 1448   Class Time Calculation (min) 48 min    [place] Name 01/24/22 [place]   [employer]     Workshops   Educator Exercise Physiologist   [facility] Psychosocial   Psychosocial Workshop Other  Focused goals and sustainable changes   Instruction Review Code 1- Verbalizes Understanding   Class Start Time 1402   Class Stop Time 1445   Class Time Calculation (min) 43 min    [place] Name 01/26/22 1600     Education   Cardiac Education Topics Pritikin   Financial trader   Weekly Topic Tasty Appetizers and Snacks   Instruction Review Code 1- Verbalizes Understanding   Class Start Time 1402   Class Stop Time 1448   Class Time Calculation (min) 46 min    [place] Name 01/28/22 1500     Education   Cardiac Education Topics Pritikin   Lexicographer Nutrition   Nutrition Calorie Density   Instruction Review Code 1- Verbalizes Understanding   Class Start Time 1400   Class Stop Time 1442   Class Time Calculation (min) 42 min    [place] Name 02/02/22 1600     Education   Cardiac Education Topics Pritikin   Financial trader   Weekly Topic Efficiency Cooking - Meals in a Snap   Instruction Review Code 1-  Verbalizes Understanding   Class Start Time [id number]   Class Stop Time 1437   Class Time Calculation (min) 42 min    [place] Name 02/04/22 1500     Education   Cardiac Education Topics Pritikin   Academic librarian Exercise Education   Exercise Education Move It!   Instruction Review Code 1- Verbalizes Understanding   Class Start Time 1405   Class Stop Time 1442   Class Time Calculation (min) 37 min    [place] Name 02/07/22 1500     Education   Cardiac Education Topics Pritikin   Lexicographer Nutrition   Nutrition Nutrition Action Plan  Instruction Review Code 1- Verbalizes Understanding   Class Start Time 1400   Class Stop Time 1447   Class Time Calculation (min) 47 min    [place] Name 02/09/22 1600     Education   Cardiac Education Topics Pritikin   Financial trader   Weekly Topic One-Pot Wonders   Instruction Review Code 1- Verbalizes Understanding   Class Start Time 1400   Class Stop Time 1450   Class Time Calculation (min) 50 min    [place] Name 02/11/22 1600     Education   Cardiac Education Topics Pritikin   Architect Education   General Education Hypertension and Heart Disease   Instruction Review Code 1- Verbalizes Understanding   Class Start Time 1410   Class Stop Time 1450   Class Time Calculation (min) 40 min    [place] Name 02/14/22 1500     Education   Cardiac Education Topics Pritikin   Environmental consultant Psychosocial   Psychosocial Workshop Healthy Sleep for a Healthy Heart   Instruction Review Code 1- Verbalizes Understanding   Class Start Time 1400   Class Stop Time 1450   Class Time Calculation (min) 50 min    [place] Name 02/16/22 1500     Education   Cardiac  Education Topics Pritikin   Financial trader   Weekly Topic Comforting Weekend Breakfasts   Instruction Review Code 1- Verbalizes Understanding   Class Start Time 1400   Class Stop Time 1445   Class Time Calculation (min) 45 min    [place] Name 02/18/22 1500     Education   Cardiac Education Topics Pritikin   Lexicographer Nutrition   Nutrition Dining Out - Part 1   Instruction Review Code 1- Verbalizes Understanding   Class Start Time 1405   Class Stop Time 1440   Class Time Calculation (min) 35 min    [place] Name 02/21/22 1600     Education   Cardiac Education Topics Pritikin   Academic librarian Exercise Education   Exercise Education Biomechanial Limitations   Instruction Review Code 1- Verbalizes Understanding   Class Start Time 1406   Class Stop Time 1442   Class Time Calculation (min) 36 min    [place] Name 02/23/22 1600     Education   Cardiac Education Topics Pritikin   Financial trader   Weekly Topic Fast Evening Meals   Instruction Review Code 1- Verbalizes Understanding   Class Start Time 1400   Class Stop Time 1445   Class Time Calculation (min) 45 min    [place] Name 02/28/22 1500     Education   Cardiac Education Topics Pritikin   Financial trader   Weekly Topic International Cuisine- Spotlight on the [place] Zones   Instruction Review Code 1- Verbalizes Understanding   Class Start Time 1405   Class Stop Time 1453   Class Time Calculation (min) 48 min  Core Videos: Exercise    Move It!  Clinical staff conducted group or individual video education with verbal and written material and guidebook.  Patient learns the recommended Pritikin exercise program. Exercise with the goal of living a long,  healthy life. Some of the health benefits of exercise include controlled diabetes, healthier blood pressure levels, improved cholesterol levels, improved heart and lung capacity, improved sleep, and better body composition. Everyone should speak with their doctor before starting or changing an exercise routine.  Biomechanical Limitations Clinical staff conducted group or individual video education with verbal and written material and guidebook.  Patient learns how biomechanical limitations can impact exercise and how we can mitigate and possibly overcome limitations to have an impactful and balanced exercise routine.  Body Composition Clinical staff conducted group or individual video education with verbal and written material and guidebook.  Patient learns that body composition (ratio of muscle mass to fat mass) is a key component to assessing overall fitness, rather than body weight alone. Increased fat mass, especially visceral belly fat, can put [place] at increased risk for metabolic syndrome, type 2 diabetes, heart disease, and even death. It is recommended to combine diet and exercise (cardiovascular and resistance training) to improve your body composition. Seek guidance from your physician and exercise physiologist before implementing an exercise routine.  Exercise Action Plan Clinical staff conducted group or individual video education with verbal and written material and guidebook.  Patient learns the recommended strategies to achieve and enjoy long-term exercise adherence, including variety, self-motivation, self-efficacy, and positive decision making. Benefits of exercise include fitness, good health, weight management, more energy, better sleep, less stress, and overall well-being.  Medical   Heart Disease Risk Reduction Clinical staff conducted group or individual video education with verbal and written material and guidebook.  Patient learns our heart is our most vital organ as it  circulates oxygen, nutrients, white blood cells, and hormones throughout the entire body, and carries waste away. Data supports a plant-based eating plan like the Pritikin Program for its effectiveness in slowing progression of and reversing heart disease. The video provides a number of recommendations to address heart disease.   Metabolic Syndrome and Belly Fat  Clinical staff conducted group or individual video education with verbal and written material and guidebook.  Patient learns what metabolic syndrome is, how it leads to heart disease, and how one can reverse it and keep it from coming back. You have metabolic syndrome if you have 3 of the following 5 criteria: abdominal obesity, high blood pressure, high triglycerides, low HDL cholesterol, and high blood sugar.  Hypertension and Heart Disease Clinical staff conducted group or individual video education with verbal and written material and guidebook.  Patient learns that high blood pressure, or hypertension, is very common in the [place]. Hypertension is largely due to excessive salt intake, but other important risk factors include being overweight, physical inactivity, drinking too much alcohol, smoking, and not eating enough potassium from fruits and vegetables. High blood pressure is a leading risk factor for heart attack, stroke, congestive heart failure, dementia, kidney failure, and premature death. Long-term effects of excessive salt intake include stiffening of the arteries and thickening of heart muscle and organ damage. Recommendations include ways to reduce hypertension and the risk of heart disease.  Diseases of Our Time - Focusing on Diabetes Clinical staff conducted group or individual video education with verbal and written material and guidebook.  Patient learns why the best way to stop diseases of our time  is prevention, through food and other lifestyle changes. Medicine (such as prescription pills and surgeries) is often  only a Band-Aid on the problem, not a long-term solution. Most common diseases of our time include obesity, type 2 diabetes, hypertension, heart disease, and cancer. The Pritikin Program is recommended and has been proven to help reduce, reverse, and/or prevent the damaging effects of metabolic syndrome.  Nutrition   Overview of the Pritikin Eating Plan  Clinical staff conducted group or individual video education with verbal and written material and guidebook.  Patient learns about the [place] for disease risk reduction. The [place] emphasizes a wide variety of unrefined, minimally-processed carbohydrates, like fruits, vegetables, whole grains, and legumes. Go, Caution, and Stop food choices are explained. Plant-based and lean animal proteins are emphasized. Rationale provided for low sodium intake for blood pressure control, low added sugars for blood sugar stabilization, and low added fats and oils for coronary artery disease risk reduction and weight management.  Calorie Density  Clinical staff conducted group or individual video education with verbal and written material and guidebook.  Patient learns about calorie density and how it impacts the Pritikin Eating Plan. Knowing the characteristics of the food you choose will help you decide whether those foods will lead to weight gain or weight loss, and whether you want to consume more or less of them. Weight loss is usually a side effect of the Pritikin Eating Plan because of its focus on low calorie-dense foods.  Label Reading  Clinical staff conducted group or individual video education with verbal and written material and guidebook.  Patient learns about the Pritikin recommended label reading guidelines and corresponding recommendations regarding calorie density, added sugars, sodium content, and whole grains.  Dining Out - Part 1  Clinical staff conducted group or individual video education with verbal and written  material and guidebook.  Patient learns that restaurant meals can be sabotaging because they can be so high in calories, fat, sodium, and/or sugar. Patient learns recommended strategies on how to positively address this and avoid unhealthy pitfalls.  Facts on Fats  Clinical staff conducted group or individual video education with verbal and written material and guidebook.  Patient learns that lifestyle modifications can be just as effective, if not more so, as many medications for lowering your risk of heart disease. A Pritikin lifestyle can help to reduce your risk of inflammation and atherosclerosis (cholesterol build-up, or plaque, in the artery walls). Lifestyle interventions such as dietary choices and physical activity address the cause of atherosclerosis. A review of the types of fats and their impact on blood cholesterol levels, along with dietary recommendations to reduce fat intake is also included.  Nutrition Action Plan  Clinical staff conducted group or individual video education with verbal and written material and guidebook.  Patient learns how to incorporate Pritikin recommendations into their lifestyle. Recommendations include planning and keeping personal health goals in mind as an important part of their success.  Healthy Mind-Set    Healthy Minds, Bodies, Hearts  Clinical staff conducted group or individual video education with verbal and written material and guidebook.  Patient learns how to identify when they are stressed. Video will discuss the impact of that stress, as well as the many benefits of stress management. Patient will also be introduced to stress management techniques. The way we think, act, and feel has an impact on our hearts.  How Our Thoughts Can Heal Our Hearts  Clinical staff conducted group or individual video  education with verbal and written material and guidebook.  Patient learns that negative thoughts can cause depression and anxiety. This can result in  negative lifestyle behavior and serious health problems. Cognitive behavioral therapy is an effective method to help control our thoughts in order to change and improve our emotional outlook.  Additional Videos:  Exercise    Improving Performance  Clinical staff conducted group or individual video education with verbal and written material and guidebook.  Patient learns to use a non-linear approach by alternating intensity levels and lengths of time spent exercising to help burn more calories and lose more body fat. Cardiovascular exercise helps improve heart health, metabolism, hormonal balance, blood sugar control, and recovery from fatigue. Resistance training improves strength, endurance, balance, coordination, reaction time, metabolism, and muscle mass. Flexibility exercise improves circulation, posture, and balance. Seek guidance from your physician and exercise physiologist before implementing an exercise routine and learn your capabilities and proper form for all exercise.  Introduction to Yoga  Clinical staff conducted group or individual video education with verbal and written material and guidebook.  Patient learns about yoga, a discipline of the coming together of mind, breath, and body. The benefits of yoga include improved flexibility, improved range of motion, better posture and core strength, increased lung function, weight loss, and positive self-image. Yoga's heart health benefits include lowered blood pressure, healthier heart rate, decreased cholesterol and triglyceride levels, improved immune function, and reduced stress. Seek guidance from your physician and exercise physiologist before implementing an exercise routine and learn your capabilities and proper form for all exercise.  Medical   Aging: Enhancing Your Quality of Life  Clinical staff conducted group or individual video education with verbal and written material and guidebook.  Patient learns key strategies and  recommendations to stay in good physical health and enhance quality of life, such as prevention strategies, having an advocate, securing a [place], and keeping a list of medications and system for tracking them. It also discusses how to avoid risk for bone loss.  Biology of Weight Control  Clinical staff conducted group or individual video education with verbal and written material and guidebook.  Patient learns that weight gain occurs because we consume more calories than we burn (eating more, moving less). Even if your body weight is normal, you may have higher ratios of fat compared to muscle mass. Too much body fat puts you at increased risk for cardiovascular disease, heart attack, stroke, type 2 diabetes, and obesity-related cancers. In addition to exercise, following the [place] can help reduce your risk.  Decoding Lab Results  Clinical staff conducted group or individual video education with verbal and written material and guidebook.  Patient learns that lab test reflects one measurement whose values change over time and are influenced by many factors, including medication, stress, sleep, exercise, food, hydration, pre-existing medical conditions, and more. It is recommended to use the knowledge from this video to become more involved with your lab results and evaluate your numbers to speak with your doctor.   Diseases of Our Time - Overview  Clinical staff conducted group or individual video education with verbal and written material and guidebook.  Patient learns that according to the CDC, 50% to 70% of chronic diseases (such as obesity, type 2 diabetes, elevated lipids, hypertension, and heart disease) are avoidable through lifestyle improvements including healthier food choices, listening to satiety cues, and increased physical activity.  Sleep Disorders Clinical staff conducted group or individual  video education with verbal and written  material and guidebook.  Patient learns how good quality and duration of sleep are important to overall health and well-being. Patient also learns about sleep disorders and how they impact health along with recommendations to address them, including discussing with a physician.  Nutrition  Dining Out - Part 2 Clinical staff conducted group or individual video education with verbal and written material and guidebook.  Patient learns how to plan ahead and communicate in order to maximize their dining experience in a healthy and nutritious manner. Included are recommended food choices based on the type of restaurant the patient is visiting.   Fueling a Best boy conducted group or individual video education with verbal and written material and guidebook.  There is a strong connection between our food choices and our health. Diseases like obesity and type 2 diabetes are very prevalent and are in large-part due to lifestyle choices. The Pritikin Eating Plan provides plenty of food and hunger-curbing satisfaction. It is easy to follow, affordable, and helps reduce health risks.  Menu Workshop  Clinical staff conducted group or individual video education with verbal and written material and guidebook.  Patient learns that restaurant meals can sabotage health goals because they are often packed with calories, fat, sodium, and sugar. Recommendations include strategies to plan ahead and to communicate with the manager, chef, or server to help order a healthier meal.  Planning Your Eating Strategy  Clinical staff conducted group or individual video education with verbal and written material and guidebook.  Patient learns about the [place] and its benefit of reducing the risk of disease. The [place] does not focus on calories. Instead, it emphasizes high-quality, nutrient-rich foods. By knowing the characteristics of the foods, we choose, we can determine their  calorie density and make informed decisions.  Targeting Your Nutrition Priorities  Clinical staff conducted group or individual video education with verbal and written material and guidebook.  Patient learns that lifestyle habits have a tremendous impact on disease risk and progression. This video provides eating and physical activity recommendations based on your personal health goals, such as reducing LDL cholesterol, losing weight, preventing or controlling type 2 diabetes, and reducing high blood pressure.  Vitamins and Minerals  Clinical staff conducted group or individual video education with verbal and written material and guidebook.  Patient learns different ways to obtain key vitamins and minerals, including through a recommended healthy diet. It is important to discuss all supplements you take with your doctor.   Healthy Mind-Set    Smoking Cessation  Clinical staff conducted group or individual video education with verbal and written material and guidebook.  Patient learns that cigarette smoking and tobacco addiction pose a serious health risk which affects millions of people. Stopping smoking will significantly reduce the risk of heart disease, lung disease, and many forms of cancer. Recommended strategies for quitting are covered, including working with your doctor to develop a successful plan.  Culinary   Becoming a Financial trader conducted group or individual video education with verbal and written material and guidebook.  Patient learns that cooking at home can be healthy, cost-effective, quick, and puts them in control. Keys to cooking healthy recipes will include looking at your recipe, assessing your equipment needs, planning ahead, making it simple, choosing cost-effective seasonal ingredients, and limiting the use of added fats, salts, and sugars.  Cooking - Breakfast and Snacks  Clinical staff conducted group or individual  video education with verbal and  written material and guidebook.  Patient learns how important breakfast is to satiety and nutrition through the entire day. Recommendations include key foods to eat during breakfast to help stabilize blood sugar levels and to prevent overeating at meals later in the day. Planning ahead is also a key component.  Cooking - Human resources officer conducted group or individual video education with verbal and written material and guidebook.  Patient learns eating strategies to improve overall health, including an approach to cook more at home. Recommendations include thinking of animal protein as a side on your plate rather than center stage and focusing instead on lower calorie dense options like vegetables, fruits, whole grains, and plant-based proteins, such as beans. Making sauces in large quantities to freeze for later and leaving the skin on your vegetables are also recommended to maximize your experience.  Cooking - Healthy Salads and Dressing Clinical staff conducted group or individual video education with verbal and written material and guidebook.  Patient learns that vegetables, fruits, whole grains, and legumes are the foundations of the [place]. Recommendations include how to incorporate each of these in flavorful and healthy salads, and how to create homemade salad dressings. Proper handling of ingredients is also covered. Cooking - Soups and [employer] - Soups and Desserts Clinical staff conducted group or individual video education with verbal and written material and guidebook.  Patient learns that Pritikin soups and desserts make for easy, nutritious, and delicious snacks and meal components that are low in sodium, fat, sugar, and calorie density, while high in vitamins, minerals, and filling fiber. Recommendations include simple and healthy ideas for soups and desserts.   Overview     The Pritikin Solution Program Overview Clinical staff conducted group or  individual video education with verbal and written material and guidebook.  Patient learns that the results of the [place] Program have been documented in more than 100 articles published in peer-reviewed journals, and the benefits include reducing risk factors for (and, in some cases, even reversing) high cholesterol, high blood pressure, type 2 diabetes, obesity, and more! An overview of the three key pillars of the Pritikin Program will be covered: eating well, doing regular exercise, and having a healthy mind-set.  WORKSHOPS  Exercise: Exercise Basics: Building Your Action Plan Clinical staff led group instruction and group discussion with PowerPoint presentation and patient guidebook. To enhance the learning environment the use of posters, models and videos may be added. At the conclusion of this workshop, patients will comprehend the difference between physical activity and exercise, as well as the benefits of incorporating both, into their routine. Patients will understand the FITT (Frequency, Intensity, Time, and Type) principle and how to use it to build an exercise action plan. In addition, safety concerns and other considerations for exercise and cardiac rehab will be addressed by the presenter. The purpose of this lesson is to promote a comprehensive and effective weekly exercise routine in order to improve patients' overall level of fitness.   Managing Heart Disease: Your Path to a Healthier Heart Clinical staff led group instruction and group discussion with PowerPoint presentation and patient guidebook. To enhance the learning environment the use of posters, models and videos may be added.At the conclusion of this workshop, patients will understand the anatomy and physiology of the heart. Additionally, they will understand how Pritikin's three pillars impact the risk factors, the progression, and the management of heart disease.  The purpose of  this lesson is to provide a high-level  overview of the heart, heart disease, and how the Pritikin lifestyle positively impacts risk factors.  Exercise Biomechanics Clinical staff led group instruction and group discussion with PowerPoint presentation and patient guidebook. To enhance the learning environment the use of posters, models and videos may be added. Patients will learn how the structural parts of their bodies function and how these functions impact their daily activities, movement, and exercise. Patients will learn how to promote a neutral spine, learn how to manage pain, and identify ways to improve their physical movement in order to promote healthy living. The purpose of this lesson is to expose patients to common physical limitations that impact physical activity. Participants will learn practical ways to adapt and manage aches and pains, and to minimize their effect on regular exercise. Patients will learn how to maintain good posture while sitting, walking, and lifting.  Balance Training and Fall Prevention  Clinical staff led group instruction and group discussion with PowerPoint presentation and patient guidebook. To enhance the learning environment the use of posters, models and videos may be added. At the conclusion of this workshop, patients will understand the importance of their sensorimotor skills (vision, proprioception, and the vestibular system) in maintaining their ability to balance as they age. Patients will apply a variety of balancing exercises that are appropriate for their current level of function. Patients will understand the common causes for poor balance, possible solutions to these problems, and ways to modify their physical environment in order to minimize their fall risk. The purpose of this lesson is to teach patients about the importance of maintaining balance as they age and ways to minimize their risk of falling.  WORKSHOPS   Nutrition:  Fueling a Scientist, research (physical sciences) led group  instruction and group discussion with PowerPoint presentation and patient guidebook. To enhance the learning environment the use of posters, models and videos may be added. Patients will review the foundational principles of the [place] and understand what constitutes a serving size in each of the food groups. Patients will also learn Pritikin-friendly foods that are better choices when away from home and review make-ahead meal and snack options. Calorie density will be reviewed and applied to three nutrition priorities: weight maintenance, weight loss, and weight gain. The purpose of this lesson is to reinforce (in a group setting) the key concepts around what patients are recommended to eat and how to apply these guidelines when away from home by planning and selecting Pritikin-friendly options. Patients will understand how calorie density may be adjusted for different weight management goals.  Mindful Eating  Clinical staff led group instruction and group discussion with PowerPoint presentation and patient guidebook. To enhance the learning environment the use of posters, models and videos may be added. Patients will briefly review the concepts of the [place] and the importance of low-calorie dense foods. The concept of mindful eating will be introduced as well as the importance of paying attention to internal hunger signals. Triggers for non-hunger eating and techniques for dealing with triggers will be explored. The purpose of this lesson is to provide patients with the opportunity to review the basic principles of the [place], discuss the value of eating mindfully and how to measure internal cues of hunger and fullness using the Hunger Scale. Patients will also discuss reasons for non-hunger eating and learn strategies to use for controlling emotional eating.  Targeting Your Nutrition Priorities Clinical staff led group instruction  and group discussion with  PowerPoint presentation and patient guidebook. To enhance the learning environment the use of posters, models and videos may be added. Patients will learn how to determine their genetic susceptibility to disease by reviewing their family history. Patients will gain insight into the importance of diet as part of an overall healthy lifestyle in mitigating the impact of genetics and other environmental insults. The purpose of this lesson is to provide patients with the opportunity to assess their personal nutrition priorities by looking at their family history, their own health history and current risk factors. Patients will also be able to discuss ways of prioritizing and modifying the [place] for their highest risk areas  Menu  Clinical staff led group instruction and group discussion with PowerPoint presentation and patient guidebook. To enhance the learning environment the use of posters, models and videos may be added. Using menus brought in from [employer], or printed from [employer], patients will apply the [place] dining out guidelines that were presented in the [employer] video. Patients will also be able to practice these guidelines in a variety of provided scenarios. The purpose of this lesson is to provide patients with the opportunity to practice hands-on learning of the [place] with actual menus and practice scenarios.  Label Reading Clinical staff led group instruction and group discussion with PowerPoint presentation and patient guidebook. To enhance the learning environment the use of posters, models and videos may be added. Patients will review and discuss the Pritikin label reading guidelines presented in Pritikin's Label Reading Educational series video. Using fool labels brought in from local grocery stores and markets, patients will apply the label reading guidelines and determine if the packaged food meet the Pritikin  guidelines. The purpose of this lesson is to provide patients with the opportunity to review, discuss, and practice hands-on learning of the Pritikin Label Reading guidelines with actual packaged food labels. [place] Workshops are designed to teach patients ways to prepare quick, simple, and affordable recipes at home. The importance of nutrition's role in chronic disease risk reduction is reflected in its emphasis in the overall Pritikin program. By learning how to prepare essential core Pritikin Eating Plan recipes, patients will increase control over what they eat; be able to customize the flavor of foods without the use of added salt, sugar, or fat; and improve the quality of the food they consume. By learning a set of core recipes which are easily assembled, quickly prepared, and affordable, patients are more likely to prepare more healthy foods at home. These workshops focus on convenient breakfasts, simple entres, side dishes, and desserts which can be prepared with minimal effort and are consistent with nutrition recommendations for cardiovascular risk reduction. Cooking [employer] are taught by a Engineer, materials (RD) who has been trained by the [employer]. The chef or RD has a clear understanding of the importance of minimizing - if not completely eliminating - added fat, sugar, and sodium in recipes. Throughout the series of [place] Workshop sessions, patients will learn about healthy ingredients and efficient methods of cooking to build confidence in their capability to prepare    Cooking School weekly topics:  Adding Flavor- Sodium-Free  Fast and Healthy Breakfasts  Powerhouse Plant-Based Proteins  Satisfying Salads and Dressings  Simple Sides and Sauces  International Cuisine-Spotlight on the [place] Zones  Delicious Desserts  Savory Soups  [employer] - Meals in  a Snap  Tasty Appetizers and  Snacks  Comforting Weekend Breakfasts  One-Pot Wonders   Fast Evening Meals  Easy Entertaining  Personalizing Your Pritikin Plate  WORKSHOPS   Healthy Mindset (Psychosocial): New Thoughts, New Behaviors Clinical staff led group instruction and group discussion with PowerPoint presentation and patient guidebook. To enhance the learning environment the use of posters, models and videos may be added. Patients will learn and practice techniques for developing effective health and lifestyle goals. Patients will be able to effectively apply the goal setting process learned to develop at least one new personal goal.  The purpose of this lesson is to expose patients to a new skill set of behavior modification techniques such as techniques setting SMART goals, overcoming barriers, and achieving new thoughts and new behaviors.  Managing Moods and Relationships Clinical staff led group instruction and group discussion with PowerPoint presentation and patient guidebook. To enhance the learning environment the use of posters, models and videos may be added. Patients will learn how emotional and chronic stress factors can impact their health and relationships. They will learn healthy ways to manage their moods and utilize positive coping mechanisms. In addition, ICR patients will learn ways to improve communication skills. The purpose of this lesson is to expose patients to ways of understanding how one's mood and health are intimately connected. Developing a healthy outlook can help build positive relationships and connections with others. Patients will understand the importance of utilizing effective communication skills that include actively listening and being heard. They will learn and understand the importance of the "4 Cs" and especially Connections in fostering of a Healthy Mind-Set.  Healthy Sleep for a Healthy Heart Clinical staff led group instruction and group discussion with PowerPoint presentation  and patient guidebook. To enhance the learning environment the use of posters, models and videos may be added. At the conclusion of this workshop, patients will be able to demonstrate knowledge of the importance of sleep to overall health, well-being, and quality of life. They will understand the symptoms of, and treatments for, common sleep disorders. Patients will also be able to identify daytime and nighttime behaviors which impact sleep, and they will be able to apply these tools to help manage sleep-related challenges. The purpose of this lesson is to provide patients with a general overview of sleep and outline the importance of quality sleep. Patients will learn about a few of the most common sleep disorders. Patients will also be introduced to the concept of "sleep hygiene," and discover ways to self-manage certain sleeping problems through simple daily behavior changes. Finally, the workshop will motivate patients by clarifying the links between quality sleep and their goals of heart-healthy living.   Recognizing and Reducing Stress Clinical staff led group instruction and group discussion with PowerPoint presentation and patient guidebook. To enhance the learning environment the use of posters, models and videos may be added. At the conclusion of this workshop, patients will be able to understand the types of stress reactions, differentiate between acute and chronic stress, and recognize the impact that chronic stress has on their health. They will also be able to apply different coping mechanisms, such as reframing negative self-talk. Patients will have the opportunity to practice a variety of stress management techniques, such as deep abdominal breathing, progressive muscle relaxation, and/or guided imagery.  The purpose of this lesson is to educate patients on the role of stress in their lives and to provide healthy techniques for coping with it.  Learning Barriers/Preferences:  Learning  Barriers/Preferences - 01/11/22 1014       Learning Barriers/Preferences   Learning Barriers Sight   wears glasses-   Learning Preferences Audio;Written Material;Computer/Internet;Group Instruction;Individual Instruction;Pictoral;Skilled Demonstration;Verbal Instruction;Video             Education Topics:  Knowledge Questionnaire Score:  Knowledge Questionnaire Score - 01/11/22 1033       Knowledge Questionnaire Score   Pre Score 23/24             Core Components/Risk Factors/Patient Goals at Admission:  Personal Goals and Risk Factors at Admission - 01/11/22 1012       Core Components/Risk Factors/Patient Goals on Admission    Weight Management Yes;Obesity;Weight Loss    Intervention Weight Management: Develop a combined nutrition and exercise program designed to reach desired caloric intake, while maintaining appropriate intake of nutrient and fiber, sodium and fats, and appropriate energy expenditure required for the weight goal.;Weight Management: Provide education and appropriate resources to help participant work on and attain dietary goals.;Weight Management/Obesity: Establish reasonable short term and long term weight goals.;Obesity: Provide education and appropriate resources to help participant work on and attain dietary goals.    Admit Weight 204 lb 9.4 oz (92.8 kg)    Goal Weight: Long Term 170 lb (77.1 kg)   Pt goal   Hypertension Yes    Intervention Provide education on lifestyle modifcations including regular physical activity/exercise, weight management, moderate sodium restriction and increased consumption of fresh fruit, vegetables, and low fat dairy, alcohol moderation, and smoking cessation.;Monitor prescription use compliance.    Expected Outcomes Short Term: Continued assessment and intervention until BP is < 140/25m HG in hypertensive participants. < 130/838mHG in hypertensive participants with diabetes, heart failure or chronic kidney disease.;Long  Term: Maintenance of blood pressure at goal levels.    Lipids Yes    Intervention Provide education and support for participant on nutrition & aerobic/resistive exercise along with prescribed medications to achieve LDL '70mg'$ , HDL >'40mg'$ .    Expected Outcomes Long Term: Cholesterol controlled with medications as prescribed, with individualized exercise RX and with personalized nutrition plan. Value goals: LDL < '70mg'$ , HDL > 40 mg.;Short Term: Participant states understanding of desired cholesterol values and is compliant with medications prescribed. Participant is following exercise prescription and nutrition guidelines.    Stress Yes    Intervention Offer individual and/or small group education and counseling on adjustment to heart disease, stress management and health-related lifestyle change. Teach and support self-help strategies.;Refer participants experiencing significant psychosocial distress to appropriate mental health specialists for further evaluation and treatment. When possible, include family members and significant others in education/counseling sessions.    Expected Outcomes Short Term: Participant demonstrates changes in health-related behavior, relaxation and other stress management skills, ability to obtain effective social support, and compliance with psychotropic medications if prescribed.;Long Term: Emotional wellbeing is indicated by absence of clinically significant psychosocial distress or social isolation.             Core Components/Risk Factors/Patient Goals Review:   Goals and Risk Factor Review     Row Name 01/18/22 0756 02/02/22 1521 03/01/22 1052         Core Components/Risk Factors/Patient Goals Review   Personal Goals Review Weight Management/Obesity;Hypertension;Lipids;Stress Weight Management/Obesity;Hypertension;Lipids;Stress Weight Management/Obesity;Hypertension;Lipids;Stress     Review FlKishawntarted intensive cardiac rehab on 01/17/22 and did well with  exercise. FlUlrichid report having two episodes of a twinge in his chest denied actual pain.Onsite provider notified. Vital signs stable FlAshokas been doing well with exercise  at intensive cardiac rehab. Tania has not voiced any reports of his anginal eqivalent. Vital signs have been stable. 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 has lost 2.6 kg since starting the program [place] has been doing well with exercise at intensive cardiac rehab. Ariston has not voiced any reports of his anginal eqivalent recently . Vital signs have been stable. 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 has lost 3.9  kg since starting the program. 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 will complete intensive cardiac rehab on 03/11/22     Expected Outcomes Rohun will continue to participate in intensive cardiac rehab for exercise, nutrition and lifestyle modifications [place] will continue to participate in intensive cardiac rehab for exercise, nutrition and lifestyle modifications [place] will continue to participate in intensive cardiac rehab for exercise, nutrition and lifestyle modifications              Core Components/Risk Factors/Patient Goals at Discharge (Final Review):   Goals and Risk Factor Review - 03/01/22 1052       Core Components/Risk Factors/Patient Goals Review   Personal Goals Review Weight Management/Obesity;Hypertension;Lipids;Stress    Review Kreig has been doing well with exercise at intensive cardiac rehab. Travers has not voiced any reports of his anginal eqivalent recently . Vital signs have been stable. 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 has lost 3.9  kg since starting the program. 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 will complete intensive cardiac rehab on 03/11/22    Expected Outcomes Dacoda will continue to participate in intensive cardiac rehab for exercise, nutrition and lifestyle modifications             ITP Comments:  ITP Comments     Row Name 01/11/22 0907 01/18/22 0746 02/02/22 1518 03/01/22 1050     ITP Comments Dr Fransico Him MD, Medical Director. Introduction to Pritikin Education Program/ Intensive Cardiac Rehab. Initial  orientation packet reviewed with the patient 30 Day ITP Review. Leshawn started intensive cardiac rehab on 01/18/22 and did well with exercise. 30 Day ITP Review. Farzan has good attendance and participation in intensive cardiac rehab 30 Day ITP Review. Brennyn has good attendance and participation in intensive cardiac rehab. Jlon will complete intensive cardiac rehab on 03/11/22             Comments: See ITP comments.

## 2022-03-02 ENCOUNTER — Encounter (HOSPITAL_COMMUNITY)
Admission: RE | Admit: 2022-03-02 | Discharge: 2022-03-02 | Disposition: A | Discharge status: Home / Self Care | Payer: BC Managed Care – PPO | Source: Ambulatory Visit | Attending: Internal Medicine

## 2022-03-02 DIAGNOSIS — I25110 Atherosclerotic heart disease of native coronary artery with unstable angina pectoris: Secondary | ICD-10-CM | POA: Diagnosis not present

## 2022-03-02 DIAGNOSIS — E782 Mixed hyperlipidemia: Secondary | ICD-10-CM | POA: Diagnosis not present

## 2022-03-02 DIAGNOSIS — I214 Non-ST elevation (NSTEMI) myocardial infarction: Secondary | ICD-10-CM | POA: Diagnosis not present

## 2022-03-02 DIAGNOSIS — Z79899 Other long term (current) drug therapy: Secondary | ICD-10-CM | POA: Diagnosis not present

## 2022-03-02 DIAGNOSIS — R079 Chest pain, unspecified: Secondary | ICD-10-CM | POA: Diagnosis not present

## 2022-03-02 DIAGNOSIS — I119 Hypertensive heart disease without heart failure: Secondary | ICD-10-CM | POA: Diagnosis not present

## 2022-03-02 DIAGNOSIS — I2089 Other forms of angina pectoris: Secondary | ICD-10-CM

## 2022-03-03 ENCOUNTER — Encounter (HOSPITAL_COMMUNITY)
Admission: RE | Admit: 2022-03-03 | Discharge: 2022-03-03 | Disposition: A | Discharge status: Home / Self Care | Payer: BC Managed Care – PPO | Source: Ambulatory Visit | Attending: Internal Medicine | Admitting: Internal Medicine

## 2022-03-03 DIAGNOSIS — I119 Hypertensive heart disease without heart failure: Secondary | ICD-10-CM | POA: Diagnosis not present

## 2022-03-03 DIAGNOSIS — Z79899 Other long term (current) drug therapy: Secondary | ICD-10-CM | POA: Diagnosis not present

## 2022-03-03 DIAGNOSIS — I214 Non-ST elevation (NSTEMI) myocardial infarction: Secondary | ICD-10-CM

## 2022-03-03 DIAGNOSIS — E782 Mixed hyperlipidemia: Secondary | ICD-10-CM | POA: Diagnosis not present

## 2022-03-03 DIAGNOSIS — R079 Chest pain, unspecified: Secondary | ICD-10-CM | POA: Diagnosis not present

## 2022-03-03 DIAGNOSIS — I25110 Atherosclerotic heart disease of native coronary artery with unstable angina pectoris: Secondary | ICD-10-CM | POA: Diagnosis not present

## 2022-03-03 NOTE — Progress Notes (Addendum)
Manuel Barker reported experiencing chest discomfort that felt like pressure and lingered for a few minutes after walking on the track for 15 minutes and felt different from the previous chest discomfort he has experienced in the past. Blood pressure 118/66 heart rate 94. Oxygen saturation 98%on room air. Manuel Barker denied nausea or radiation. Discomfort went away. Exercise  stopped. Telemetry rhythm, Sinus with T wave inversion this has been previously documented. Onsite provider Dr Inda Merlin was notified about report of discomfort and ordered a 12 lead ECG. Dr Cyd Silence reviewed the 12 lead ECG and spoke with and assessed the patient. Manuel Barker walked a few laps without further symptoms. Dr Cyd Silence talked with the doctor of the the day and said the Manuel Barker is okay to be discharged from cardiac rehab. Dr Cyd Silence also wants the patient to be evaluated in the office. Appointment obtained for Manuel Barker to see [facility] on 03/11/22 at 1055. Patient was instructed to hold on further exercise at cardiac rehab until he is cleared to return. Patient is aware and is agreeable to the plan. Manuel Barker left cardiac rehab without further complaints or symptoms. Exit blood pressure 118/78 heart rate 73. Dr Harrington Challenger was also notified about today's events and she agrees with the plan.Barnet Pall, RN,BSN 03/03/2022 5:33 PM

## 2022-03-04 ENCOUNTER — Encounter (HOSPITAL_COMMUNITY): Payer: BC Managed Care – PPO

## 2022-03-04 NOTE — Progress Notes (Signed)
  Pulmonary Rehab Event Note   Manuel Barker [id number] DOB: 1950/08/03 DOA: 03/03/2022  PCP: [name], [name], MD    HPI:    Notified by nursing that patient began to experience chest discomfort after approximately 15 minutes of participation in his cardiac rehab today.  Per my discussion with the patient, he describes an episode of midsternal to left-sided chest pressure, mild in intensity, nonradiating and lasting for several minutes.  This eventually resolved with rest.  Patient states that this episode of chest discomfort is different than his usual fleeting sharp episodes of pain.    Physical Exam:  BP 118/78, HR 73  Constitutional: Awake alert and oriented x3, no associated distress.   Respiratory: clear to auscultation bilaterally, no wheezing, no crackles. Normal respiratory effort. No accessory muscle use.  Cardiovascular: Regular rate and rhythm, no murmurs / rubs / gallops. No extremity edema. 2+ pedal pulses. No carotid bruits.    EKG: Personally reviewed.  Rhythm is NSR with heart rate of 63BPM.  Diffuse T wave inversions in the precordial leads with evidence of LVH, seemingly unchanged from prior.    No dynamic ST segment changes appreciated.  Assessment/Plan  Chest Pain -patient complaining of a significantly different episode of chest discomfort that has occurred with exertion and is different in quality and intensity compared to patient's baseline angina.  Upon my evaluation of the patient patient does not appear to be in distress with chest discomfort already resolved.  Physical exam is essentially unremarkable.  My evaluation of the patient's EKG reveals no dynamic ST segment change.  I have personally observed the patient walking around the clinic after this episode with no recurrence of chest discomfort.  This patient has known advanced coronary disease that is being medically managed.  Results of previous cardiac catheterization from 09/2021  reviewed.  Patient is to follow closely in his cardiology clinic on 3/8 and if he develops worsening chest discomfort in the meantime he has been advised to seek medical attention.  Dr. Harrington Challenger his cardiologist has been notified.  Patient is in agreement with the plan.   Vernelle Emerald MD [facility] Pager [phone]  If 7PM-7AM, please contact night-coverage [email or web address] Use universal [place] password for that web site. If you do not have the password, please call the hospital operator.  03/04/2022, 4:05 PM

## 2022-03-05 ENCOUNTER — Other Ambulatory Visit: Payer: Self-pay | Admitting: Internal Medicine

## 2022-03-05 DIAGNOSIS — I10 Essential (primary) hypertension: Secondary | ICD-10-CM

## 2022-03-07 ENCOUNTER — Encounter (HOSPITAL_COMMUNITY): Payer: BC Managed Care – PPO

## 2022-03-09 ENCOUNTER — Encounter (HOSPITAL_COMMUNITY): Payer: BC Managed Care – PPO

## 2022-03-10 NOTE — Progress Notes (Signed)
Cardiology Office Note:    Date:  03/11/2022  ID:  Clayton Bibles, DOB 01-24-1950, MRN [id number] [facility] Cardiologist:  [name], MD      Patient Profile:   Coronary artery disease  s/p prior stent x 3 to LCx s/p NSTEMI 12/05/16 >> PCI: POBA to oLCx and dLCx stents 2/2 ISR LHC: LAD proximal 50, mid 50, distal 90; D1 ostial 90; LCx ostial stent 70 ISR, distal stent 90 ISR; RCA proximal 50, RPDA ostial 40, EF 50-55 Myoview 09/24/2019: Apical and apical inferior ischemia, EF 52, intermediate risk>> med Rx LHC 09/21/2021: LAD proximal 75, mid 50, distal 90, D1 ostial 90, lateral D1 90; LCx ostial stent patent with 10 ISR, distal stent patent with 25 ISR; RCA proximal 75, RPDA ostial 40, EF >65 >> Initial plan for CABG, But reviewed with Heart Team - Med Rx  TTE 09/21/2021: EF 65-70, no RWMA, moderate LVH, GR 2 DD, normal RVSF, RAP 3 Hypertension  Hyperlipidemia  Prostate CA Aortic atherosclerosis Lung nodule     History of Present Illness:   Manuel Barker is a 72 y.o. male who returns for the evaluation of chest pain. He was last seen by Dr. Harrington Challenger 02/04/22. He has been attending cardiac rehabilitation and recently noted a different type of chest pain.  He is here alone.  He notes that his anginal symptoms have improved since starting at cardiac rehab.  He will still have occasional left-sided chest discomfort that is very brief.  Last week, he had a different type of discomfort at the end of exercise that lasted longer.  He has not been exercising since.  He has not had a recurrence.  He has noted some nausea over the past few days but no chest discomfort.  I have recommended he use Pepcid for a week or 2 and also eat bland foods.  He knows to contact us if he has nausea associated with chest discomfort and exertion.  He has not had syncope, orthopnea, leg edema.  Review of Systems  Gastrointestinal:  Negative for diarrhea and vomiting.   see HPI    Studies Reviewed:     EKG: EKG from 03/03/2022 personally reviewed and interpreted-NSR, HR 63, LVH with T wave inversions V2-V6, 1, aVL, QTc 417, no significant change when compared to prior tracing  Risk Assessment/Calculations:             Physical Exam:   VS:  BP 126/80   Pulse 60   Ht '5\' 6"'$  (1.676 m)   Wt 196 lb 9.6 oz (89.2 kg)   SpO2 98%   BMI 31.73 kg/m    Wt Readings from Last 3 Encounters:  03/11/22 196 lb 9.6 oz (89.2 kg)  02/04/22 199 lb (90.3 kg)  01/11/22 204 lb 9.4 oz (92.8 kg)    Constitutional:      Appearance: Healthy appearance. Not in distress.  Neck:     Vascular: JVD normal.  Pulmonary:     Breath sounds: Normal breath sounds. No wheezing. No rales.  Cardiovascular:     Normal rate. Regular rhythm. Normal S1. Normal S2.      Murmurs: There is no murmur.  Edema:    Peripheral edema absent.  Abdominal:     Palpations: Abdomen is soft.       ASSESSMENT AND PLAN:   Coronary artery disease involving native coronary artery of native heart with angina pectoris ([place]) Diffuse 3 vessel CAD on cardiac catheterization in 09/2021. Medical Rx  was recommended. He recently has chest pain that was different. He has not had a recurrence. He has continued to have some L sided chest pain during exercise. This is improved since he started cardiac rehabilitation. On the day he had the change in his symptoms, he had forgotten to take his medications. We discussed continuing with current therapy vs adding Ranexa. I favor continuing his current Rx. He agrees. He can return to cardiac rehabilitation. If he continues to have similar symptoms, we can add Ranexa to his medical regimen. Continue amlodipine 10 mg daily, aspirin 81 mg daily, Plavix 75 mg daily, Toprol-XL 50 mg daily, as needed nitroglycerin, Crestor 40 mg daily.  Follow-up in 6 to 8 weeks.  Hypertension The patient's blood pressure is controlled on his current regimen.  Continue current therapy with amlodipine 10 mg daily, Toprol-XL 50 mg  daily, Micardis 40 mg daily.  Hyperlipidemia LDL in February 2024 was optimal at 49.  Continue Crestor 40 mg daily.      Dispo:  Return in about 8 weeks (around 05/06/2022) for Close Follow Up, w/ Dr. Harrington Challenger, Ambrose Pancoast, NP, or Richardson Dopp, PA-C.  Signed, Richardson Dopp, PA-C

## 2022-03-11 ENCOUNTER — Encounter: Payer: Self-pay | Admitting: Physician Assistant

## 2022-03-11 ENCOUNTER — Encounter (HOSPITAL_COMMUNITY)
Admission: RE | Admit: 2022-03-11 | Discharge: 2022-03-11 | Disposition: A | Discharge status: Home / Self Care | Payer: BC Managed Care – PPO | Source: Ambulatory Visit | Attending: Internal Medicine | Admitting: Internal Medicine

## 2022-03-11 ENCOUNTER — Encounter: Payer: BC Managed Care – PPO | Admitting: Physician Assistant

## 2022-03-11 VITALS — BP 126/80 | HR 60 | Ht 66.0 in | Wt 196.6 lb

## 2022-03-11 DIAGNOSIS — I10 Essential (primary) hypertension: Secondary | ICD-10-CM

## 2022-03-11 DIAGNOSIS — I252 Old myocardial infarction: Secondary | ICD-10-CM | POA: Diagnosis not present

## 2022-03-11 DIAGNOSIS — Z5189 Encounter for other specified aftercare: Secondary | ICD-10-CM | POA: Insufficient documentation

## 2022-03-11 DIAGNOSIS — Z7902 Long term (current) use of antithrombotics/antiplatelets: Secondary | ICD-10-CM | POA: Insufficient documentation

## 2022-03-11 DIAGNOSIS — E7800 Pure hypercholesterolemia, unspecified: Secondary | ICD-10-CM | POA: Diagnosis not present

## 2022-03-11 DIAGNOSIS — Z7982 Long term (current) use of aspirin: Secondary | ICD-10-CM | POA: Insufficient documentation

## 2022-03-11 DIAGNOSIS — I25119 Atherosclerotic heart disease of native coronary artery with unspecified angina pectoris: Secondary | ICD-10-CM | POA: Insufficient documentation

## 2022-03-11 DIAGNOSIS — I214 Non-ST elevation (NSTEMI) myocardial infarction: Secondary | ICD-10-CM

## 2022-03-11 DIAGNOSIS — Z79899 Other long term (current) drug therapy: Secondary | ICD-10-CM | POA: Insufficient documentation

## 2022-03-11 DIAGNOSIS — Z8546 Personal history of malignant neoplasm of prostate: Secondary | ICD-10-CM | POA: Diagnosis not present

## 2022-03-11 DIAGNOSIS — E785 Hyperlipidemia, unspecified: Secondary | ICD-10-CM | POA: Insufficient documentation

## 2022-03-11 DIAGNOSIS — I700 Atherosclerosis of aorta: Secondary | ICD-10-CM | POA: Insufficient documentation

## 2022-03-11 NOTE — Assessment & Plan Note (Signed)
LDL in February 2024 was optimal at 49.  Continue Crestor 40 mg daily.

## 2022-03-11 NOTE — Assessment & Plan Note (Signed)
The patient's blood pressure is controlled on his current regimen.  Continue current therapy with amlodipine 10 mg daily, Toprol-XL 50 mg daily, Micardis 40 mg daily.

## 2022-03-11 NOTE — Progress Notes (Signed)
Atilla returned to exercise at cardiac rehab and exercised without difficulty.Harrell Gave RN BSN

## 2022-03-11 NOTE — Assessment & Plan Note (Signed)
Diffuse 3 vessel CAD on cardiac catheterization in 09/2021. Medical Rx was recommended. He recently has chest pain that was different. He has not had a recurrence. He has continued to have some L sided chest pain during exercise. This is improved since he started cardiac rehabilitation. On the day he had the change in his symptoms, he had forgotten to take his medications. We discussed continuing with current therapy vs adding Ranexa. I favor continuing his current Rx. He agrees. He can return to cardiac rehabilitation. If he continues to have similar symptoms, we can add Ranexa to his medical regimen. Continue amlodipine 10 mg daily, aspirin 81 mg daily, Plavix 75 mg daily, Toprol-XL 50 mg daily, as needed nitroglycerin, Crestor 40 mg daily.  Follow-up in 6 to 8 weeks.

## 2022-03-11 NOTE — Patient Instructions (Addendum)
Medication Instructions:  Your physician recommends that you continue on your current medications as directed. Please refer to the Current Medication list given to you today.  You can take some over the counter Prilosec, 20 mg twice a day for 1-2 weeks then as needed  *If you need a refill on your cardiac medications before your next appointment, please call your pharmacy*   Lab Work: None ordered If you have labs (blood work) drawn today and your tests are completely normal, you will receive your results only by: [place] (if you have MyChart) OR A paper copy in the mail If you have any lab test that is abnormal or we need to change your treatment, we will call you to review the results.   Testing/Procedures: None ordered   Follow-Up: At [facility], you and your health needs are our priority.  As part of our continuing mission to provide you with exceptional heart care, we have created designated Provider Care Teams.  These Care Teams include your primary Cardiologist (physician) and Advanced Practice Providers (APPs -  Physician Assistants and Nurse Practitioners) who all work together to provide you with the care you need, when you need it.  We recommend signing up for the patient portal called "MyChart".  Sign up information is provided on this After Visit Summary.  MyChart is used to connect with patients for Virtual Visits (Telemedicine).  Patients are able to view lab/test results, encounter notes, upcoming appointments, etc.  Non-urgent messages can be sent to your provider as well.   To learn more about what you can do with MyChart, go to [email or web address].    Your next appointment:   6-8 week(s)  Provider:   [name], MD  Ambrose Pancoast, NP OR SCOTT WEAVER, PA-C    Other Instructions  IT IS OK TO START BACK TO CARDIAC REHAB

## 2022-03-14 ENCOUNTER — Other Ambulatory Visit: Payer: Self-pay | Admitting: Internal Medicine

## 2022-03-14 ENCOUNTER — Encounter (HOSPITAL_COMMUNITY)
Admission: RE | Admit: 2022-03-14 | Discharge: 2022-03-14 | Disposition: A | Discharge status: Home / Self Care | Payer: BC Managed Care – PPO | Source: Ambulatory Visit | Attending: Internal Medicine | Admitting: Internal Medicine

## 2022-03-14 DIAGNOSIS — Z5189 Encounter for other specified aftercare: Secondary | ICD-10-CM | POA: Diagnosis not present

## 2022-03-14 DIAGNOSIS — I214 Non-ST elevation (NSTEMI) myocardial infarction: Secondary | ICD-10-CM

## 2022-03-14 DIAGNOSIS — E785 Hyperlipidemia, unspecified: Secondary | ICD-10-CM | POA: Diagnosis not present

## 2022-03-14 DIAGNOSIS — I25119 Atherosclerotic heart disease of native coronary artery with unspecified angina pectoris: Secondary | ICD-10-CM | POA: Diagnosis not present

## 2022-03-14 DIAGNOSIS — I252 Old myocardial infarction: Secondary | ICD-10-CM | POA: Diagnosis not present

## 2022-03-14 DIAGNOSIS — I10 Essential (primary) hypertension: Secondary | ICD-10-CM | POA: Diagnosis not present

## 2022-03-14 DIAGNOSIS — E7800 Pure hypercholesterolemia, unspecified: Secondary | ICD-10-CM | POA: Diagnosis not present

## 2022-03-16 ENCOUNTER — Encounter (HOSPITAL_COMMUNITY)
Admission: RE | Admit: 2022-03-16 | Discharge: 2022-03-16 | Disposition: A | Discharge status: Home / Self Care | Payer: BC Managed Care – PPO | Source: Ambulatory Visit | Attending: Internal Medicine | Admitting: Internal Medicine

## 2022-03-16 DIAGNOSIS — E7800 Pure hypercholesterolemia, unspecified: Secondary | ICD-10-CM | POA: Diagnosis not present

## 2022-03-16 DIAGNOSIS — I252 Old myocardial infarction: Secondary | ICD-10-CM | POA: Diagnosis not present

## 2022-03-16 DIAGNOSIS — I10 Essential (primary) hypertension: Secondary | ICD-10-CM | POA: Diagnosis not present

## 2022-03-16 DIAGNOSIS — I214 Non-ST elevation (NSTEMI) myocardial infarction: Secondary | ICD-10-CM

## 2022-03-16 DIAGNOSIS — I25119 Atherosclerotic heart disease of native coronary artery with unspecified angina pectoris: Secondary | ICD-10-CM | POA: Diagnosis not present

## 2022-03-16 DIAGNOSIS — Z5189 Encounter for other specified aftercare: Secondary | ICD-10-CM | POA: Diagnosis not present

## 2022-03-16 DIAGNOSIS — E785 Hyperlipidemia, unspecified: Secondary | ICD-10-CM | POA: Diagnosis not present

## 2022-03-18 ENCOUNTER — Encounter (HOSPITAL_COMMUNITY)
Admission: RE | Admit: 2022-03-18 | Discharge: 2022-03-18 | Disposition: A | Discharge status: Home / Self Care | Payer: BC Managed Care – PPO | Source: Ambulatory Visit | Attending: Internal Medicine

## 2022-03-18 DIAGNOSIS — I214 Non-ST elevation (NSTEMI) myocardial infarction: Secondary | ICD-10-CM

## 2022-03-18 DIAGNOSIS — E785 Hyperlipidemia, unspecified: Secondary | ICD-10-CM | POA: Diagnosis not present

## 2022-03-18 DIAGNOSIS — I25119 Atherosclerotic heart disease of native coronary artery with unspecified angina pectoris: Secondary | ICD-10-CM | POA: Diagnosis not present

## 2022-03-18 DIAGNOSIS — I252 Old myocardial infarction: Secondary | ICD-10-CM | POA: Diagnosis not present

## 2022-03-18 DIAGNOSIS — I10 Essential (primary) hypertension: Secondary | ICD-10-CM | POA: Diagnosis not present

## 2022-03-18 DIAGNOSIS — Z5189 Encounter for other specified aftercare: Secondary | ICD-10-CM | POA: Diagnosis not present

## 2022-03-18 DIAGNOSIS — E7800 Pure hypercholesterolemia, unspecified: Secondary | ICD-10-CM | POA: Diagnosis not present

## 2022-03-21 ENCOUNTER — Encounter (HOSPITAL_COMMUNITY)
Admission: RE | Admit: 2022-03-21 | Discharge: 2022-03-21 | Disposition: A | Discharge status: Home / Self Care | Payer: BC Managed Care – PPO | Source: Ambulatory Visit | Attending: Internal Medicine

## 2022-03-21 ENCOUNTER — Other Ambulatory Visit: Payer: Self-pay | Admitting: Interventional Cardiology

## 2022-03-21 DIAGNOSIS — E785 Hyperlipidemia, unspecified: Secondary | ICD-10-CM | POA: Diagnosis not present

## 2022-03-21 DIAGNOSIS — I10 Essential (primary) hypertension: Secondary | ICD-10-CM | POA: Diagnosis not present

## 2022-03-21 DIAGNOSIS — I252 Old myocardial infarction: Secondary | ICD-10-CM | POA: Diagnosis not present

## 2022-03-21 DIAGNOSIS — I25119 Atherosclerotic heart disease of native coronary artery with unspecified angina pectoris: Secondary | ICD-10-CM | POA: Diagnosis not present

## 2022-03-21 DIAGNOSIS — E7800 Pure hypercholesterolemia, unspecified: Secondary | ICD-10-CM | POA: Diagnosis not present

## 2022-03-21 DIAGNOSIS — I2089 Other forms of angina pectoris: Secondary | ICD-10-CM

## 2022-03-21 DIAGNOSIS — Z5189 Encounter for other specified aftercare: Secondary | ICD-10-CM | POA: Diagnosis not present

## 2022-03-22 ENCOUNTER — Other Ambulatory Visit: Payer: Self-pay | Admitting: Interventional Cardiology

## 2022-03-23 ENCOUNTER — Encounter (HOSPITAL_COMMUNITY)
Admission: RE | Admit: 2022-03-23 | Discharge: 2022-03-23 | Disposition: A | Discharge status: Home / Self Care | Payer: BC Managed Care – PPO | Source: Ambulatory Visit | Attending: Internal Medicine

## 2022-03-23 DIAGNOSIS — I10 Essential (primary) hypertension: Secondary | ICD-10-CM | POA: Diagnosis not present

## 2022-03-23 DIAGNOSIS — I2089 Other forms of angina pectoris: Secondary | ICD-10-CM

## 2022-03-23 DIAGNOSIS — E785 Hyperlipidemia, unspecified: Secondary | ICD-10-CM | POA: Diagnosis not present

## 2022-03-23 DIAGNOSIS — I252 Old myocardial infarction: Secondary | ICD-10-CM | POA: Diagnosis not present

## 2022-03-23 DIAGNOSIS — Z5189 Encounter for other specified aftercare: Secondary | ICD-10-CM | POA: Diagnosis not present

## 2022-03-23 DIAGNOSIS — E7800 Pure hypercholesterolemia, unspecified: Secondary | ICD-10-CM | POA: Diagnosis not present

## 2022-03-23 DIAGNOSIS — I25119 Atherosclerotic heart disease of native coronary artery with unspecified angina pectoris: Secondary | ICD-10-CM | POA: Diagnosis not present

## 2022-03-24 NOTE — Progress Notes (Signed)
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 Rehabilitation for the exercise;Stress management education    Continue Psychosocial Services  No Follow up required No Follow up required No Follow up required No Follow up required      Initial Review   Source of Stress Concerns Occupation Occupation Occupation Occupation    Comments Will continue to monitor and offer support as needed Will continue to monitor and offer support as needed Will continue to monitor and offer support as needed Will continue to monitor and offer support as needed             Psychosocial Discharge (Final Psychosocial Re-Evaluation):  Psychosocial Re-Evaluation - 03/24/22 0753       Psychosocial Re-Evaluation   Current issues with Current Stress Concerns    Comments 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 will  complete intensive cardiac rehab on 03/25/22    Expected Outcomes 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 will have decreased or controlled stress upon completion of intensive cardiac rehab    Interventions Encouraged to attend Cardiac Rehabilitation for the exercise;Stress management education    Continue Psychosocial Services  No Follow up required      Initial Review   Source of Stress Concerns Occupation    Comments Will continue to monitor and offer support as needed             Vocational Rehabilitation: Provide vocational rehab assistance to qualifying candidates.   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            Education: Education Goals: Education classes will be provided on a weekly basis, covering required topics. Participant will state understanding/return demonstration of topics presented.    Education     Row Name 01/17/22 1600     Education   Cardiac Education Topics Pritikin   IT sales professional Nutrition   Nutrition Workshop Label Reading   Instruction Review Code 1- Verbalizes Understanding   Class Start Time 1400   Class Stop Time 1458   Class Time Calculation (min) 58 min    [place] Name 01/20/22 1000     Education   Cardiac Education Topics Pritikin   [employer]  completed 01/19/22     Cooking School   Educator Dietitian   Weekly Topic [employer] Desserts   Instruction Review Code 1- Information systems manager   Class Start Time 1400   Class Stop Time 1458   Class Time Calculation (min) 58 min    [place] Name 01/21/22 1500     Education   Cardiac Education Topics Pritikin   Lexicographer Nutrition    Nutrition Other  Label Reading   Instruction Review Code 1- Verbalizes Understanding   Class Start Time 1400   Class Stop Time 1448   Class Time Calculation (min) 48 min    [place] Name 01/24/22 [place]   [employer]     Workshops   Educator Exercise Physiologist   Select Psychosocial   Psychosocial Workshop Other  Focused goals and sustainable changes   Instruction Review Code 1- Verbalizes Understanding   Class Start Time 1402   Class Stop Time 1445   Class Time Calculation (min) 43 min    [place] Name 01/26/22 1600     Education   Cardiac Education Topics Pritikin   Financial trader   Weekly Topic Tasty Appetizers and Snacks   Instruction Review Code 1- Verbalizes Understanding   Class Start Time 1402   Class Stop Time 1448   Class Time Calculation (min) 46 min    [place] Name 01/28/22 1500     Education   Cardiac Education Topics Pritikin   Lexicographer Nutrition   Nutrition Calorie Density   Instruction Review Code 1- Verbalizes Understanding   Class  Start Time 1400   Class Stop Time 1442   Class Time Calculation (min) 42 min    [place] Name 02/02/22 1600     Education   Cardiac Education Topics Pritikin   Financial trader   Weekly Topic Efficiency Cooking - Meals in a Snap   Instruction Review Code 1- Verbalizes Understanding   Class Start Time [id number]   Class Stop Time 1437   Class Time Calculation (min) 42 min    [place] Name 02/04/22 1500     Education   Cardiac Education Topics Pritikin   Academic librarian Exercise Education   Exercise Education Move It!   Instruction Review Code 1- Verbalizes Understanding   Class Start Time 1405   Class Stop Time 1442   Class Time Calculation (min) 37 min    [place]  Name 02/07/22 1500     Education   Cardiac Education Topics Pritikin   Scientist, research (life sciences)   Educator Dietitian   Select Nutrition   Nutrition Nutrition Action Plan   Instruction Review Code 1- Verbalizes Understanding   Class Start Time 1400   Class Stop Time 1447   Class Time Calculation (min) 47 min    [place] Name 02/09/22 1600     Education   Cardiac Education Topics Pritikin   Financial trader   Weekly Topic One-Pot Wonders   Instruction Review Code 1- Verbalizes Understanding   Class Start Time 1400   Class Stop Time 1450   Class Time Calculation (min) 50 min    [place] Name 02/11/22 1600     Education   Cardiac Education Topics Pritikin   Architect Education   General Education Hypertension and Heart Disease   Instruction Review Code 1- Verbalizes Understanding   Class Start Time 1410   Class Stop Time 1450   Class Time Calculation (min) 40 min    [place] Name 02/14/22 1500     Education   Cardiac Education Topics Pritikin   Environmental consultant Psychosocial   Psychosocial Workshop Healthy Sleep for a Healthy Heart   Instruction Review Code 1- Verbalizes Understanding   Class Start Time 1400   Class Stop Time 1450   Class Time Calculation (min) 50 min    [place] Name 02/16/22 1500     Education   Cardiac Education Topics Pritikin   Financial trader   Weekly Topic Comforting Weekend Breakfasts   Instruction Review Code 1- Verbalizes Understanding   Class Start Time 1400   Class Stop Time 1445   Class Time Calculation (min) 45 min    [place] Name 02/18/22 1500     Education   Cardiac Education Topics Pritikin   Scientist, research (life sciences)   Educator Dietitian   Select Nutrition   Nutrition Dining Out - Part 1    Instruction Review Code 1- Verbalizes Understanding   Class Start Time 1405   Class Stop Time 1440   Class Time Calculation (min) 35 min    [place] Name 02/21/22 1600  Education   Cardiac Education Topics Pritikin   Academic librarian Exercise Education   Exercise Education Biomechanial Limitations   Instruction Review Code 1- Verbalizes Understanding   Class Start Time 1406   Class Stop Time 1442   Class Time Calculation (min) 36 min    [place] Name 02/23/22 1600     Education   Cardiac Education Topics Pritikin   Financial trader   Weekly Topic Fast Evening Meals   Instruction Review Code 1- Verbalizes Understanding   Class Start Time 1400   Class Stop Time 1445   Class Time Calculation (min) 45 min    [place] Name 02/28/22 1500     Education   Cardiac Education Topics Pritikin   Financial trader   Weekly Topic International Cuisine- Spotlight on the [place] Zones   Instruction Review Code 1- Verbalizes Understanding   Class Start Time 1405   Class Stop Time 1453   Class Time Calculation (min) 48 min    [place] Name 03/02/22 1600     Education   Cardiac Education Topics Pritikin   Social research officer, government Nutrition   Nutrition Facts on Fat   Instruction Review Code 1- Verbalizes Understanding   Class Start Time 1400   Class Stop Time 1450   Class Time Calculation (min) 50 min    [place] Name 03/11/22 1500     Education   Cardiac Education Topics Pritikin   Architect Education   General Education Heart Disease Risk Reduction   Instruction Review Code 1- Verbalizes Understanding   Class Start Time 1355   Class Stop Time 1440   Class Time Calculation (min) 45 min    [place] Name 03/14/22  1500     Education   Cardiac Education Topics Pritikin   Environmental consultant Exercise   Exercise Workshop Hotel manager and Fall Prevention   Instruction Review Code 1- Verbalizes Understanding   Class Start Time 1350   Class Stop Time 1432   Class Time Calculation (min) 42 min    [place] Name 03/16/22 1500     Education   Cardiac Education Topics Pritikin   Financial trader   Weekly Topic Fast and Healthy Breakfasts   Instruction Review Code 1- Verbalizes Understanding   Class Start Time 1355   Class Stop Time 1450   Class Time Calculation (min) 55 min    [place] Name 03/18/22 1600     Education   Cardiac Education Topics Pritikin   Lexicographer Nutrition   Nutrition Overview of the Pritikin Eating Plan   Instruction Review Code 1- Verbalizes Understanding   Class Start Time 1400   Class Stop Time 1436   Class Time Calculation (min) 36 min    [place] Name 03/21/22 1600     Education   Cardiac Education Topics Pritikin   Doctor, general practice  Select Psychosocial   Psychosocial Workshop Recognizing and Reducing Stress   Instruction Review Code 1- Verbalizes Understanding   Class Start Time 1405   Class Stop Time 1454   Class Time Calculation (min) 49 min    [place] Name 03/23/22 1600     Education   Cardiac Education Topics Pritikin   Financial trader   Weekly Topic Personalizing Your Pritikin Plate   Instruction Review Code 1- Verbalizes Understanding   Class Start Time 1400   Class Stop Time 1448   Class Time Calculation (min) 48 min            Core Videos: Exercise    Move It!  Clinical staff conducted group or individual video education with verbal and written material and guidebook.  Patient learns the  recommended Pritikin exercise program. Exercise with the goal of living a long, healthy life. Some of the health benefits of exercise include controlled diabetes, healthier blood pressure levels, improved cholesterol levels, improved heart and lung capacity, improved sleep, and better body composition. Everyone should speak with their doctor before starting or changing an exercise routine.  Biomechanical Limitations Clinical staff conducted group or individual video education with verbal and written material and guidebook.  Patient learns how biomechanical limitations can impact exercise and how we can mitigate and possibly overcome limitations to have an impactful and balanced exercise routine.  Body Composition Clinical staff conducted group or individual video education with verbal and written material and guidebook.  Patient learns that body composition (ratio of muscle mass to fat mass) is a key component to assessing overall fitness, rather than body weight alone. Increased fat mass, especially visceral belly fat, can put [place] at increased risk for metabolic syndrome, type 2 diabetes, heart disease, and even death. It is recommended to combine diet and exercise (cardiovascular and resistance training) to improve your body composition. Seek guidance from your physician and exercise physiologist before implementing an exercise routine.  Exercise Action Plan Clinical staff conducted group or individual video education with verbal and written material and guidebook.  Patient learns the recommended strategies to achieve and enjoy long-term exercise adherence, including variety, self-motivation, self-efficacy, and positive decision making. Benefits of exercise include fitness, good health, weight management, more energy, better sleep, less stress, and overall well-being.  Medical   Heart Disease Risk Reduction Clinical staff conducted group or individual video education with verbal and written material  and guidebook.  Patient learns our heart is our most vital organ as it circulates oxygen, nutrients, white blood cells, and hormones throughout the entire body, and carries waste away. Data supports a plant-based eating plan like the Pritikin Program for its effectiveness in slowing progression of and reversing heart disease. The video provides a number of recommendations to address heart disease.   Metabolic Syndrome and Belly Fat  Clinical staff conducted group or individual video education with verbal and written material and guidebook.  Patient learns what metabolic syndrome is, how it leads to heart disease, and how one can reverse it and keep it from coming back. You have metabolic syndrome if you have 3 of the following 5 criteria: abdominal obesity, high blood pressure, high triglycerides, low HDL cholesterol, and high blood sugar.  Hypertension and Heart Disease Clinical staff conducted group or individual video education with verbal and written material and guidebook.  Patient learns that high blood pressure, or hypertension, is very common in the [place]. Hypertension is largely  due to excessive salt intake, but other important risk factors include being overweight, physical inactivity, drinking too much alcohol, smoking, and not eating enough potassium from fruits and vegetables. High blood pressure is a leading risk factor for heart attack, stroke, congestive heart failure, dementia, kidney failure, and premature death. Long-term effects of excessive salt intake include stiffening of the arteries and thickening of heart muscle and organ damage. Recommendations include ways to reduce hypertension and the risk of heart disease.  Diseases of Our Time - Focusing on Diabetes Clinical staff conducted group or individual video education with verbal and written material and guidebook.  Patient learns why the best way to stop diseases of our time is prevention, through food and other lifestyle  changes. Medicine (such as prescription pills and surgeries) is often only a Band-Aid on the problem, not a long-term solution. Most common diseases of our time include obesity, type 2 diabetes, hypertension, heart disease, and cancer. The Pritikin Program is recommended and has been proven to help reduce, reverse, and/or prevent the damaging effects of metabolic syndrome.  Nutrition   Overview of the Pritikin Eating Plan  Clinical staff conducted group or individual video education with verbal and written material and guidebook.  Patient learns about the [place] for disease risk reduction. The [place] emphasizes a wide variety of unrefined, minimally-processed carbohydrates, like fruits, vegetables, whole grains, and legumes. Go, Caution, and Stop food choices are explained. Plant-based and lean animal proteins are emphasized. Rationale provided for low sodium intake for blood pressure control, low added sugars for blood sugar stabilization, and low added fats and oils for coronary artery disease risk reduction and weight management.  Calorie Density  Clinical staff conducted group or individual video education with verbal and written material and guidebook.  Patient learns about calorie density and how it impacts the Pritikin Eating Plan. Knowing the characteristics of the food you choose will help you decide whether those foods will lead to weight gain or weight loss, and whether you want to consume more or less of them. Weight loss is usually a side effect of the Pritikin Eating Plan because of its focus on low calorie-dense foods.  Label Reading  Clinical staff conducted group or individual video education with verbal and written material and guidebook.  Patient learns about the Pritikin recommended label reading guidelines and corresponding recommendations regarding calorie density, added sugars, sodium content, and whole grains.  Dining Out - Part 1  Clinical staff  conducted group or individual video education with verbal and written material and guidebook.  Patient learns that restaurant meals can be sabotaging because they can be so high in calories, fat, sodium, and/or sugar. Patient learns recommended strategies on how to positively address this and avoid unhealthy pitfalls.  Facts on Fats  Clinical staff conducted group or individual video education with verbal and written material and guidebook.  Patient learns that lifestyle modifications can be just as effective, if not more so, as many medications for lowering your risk of heart disease. A Pritikin lifestyle can help to reduce your risk of inflammation and atherosclerosis (cholesterol build-up, or plaque, in the artery walls). Lifestyle interventions such as dietary choices and physical activity address the cause of atherosclerosis. A review of the types of fats and their impact on blood cholesterol levels, along with dietary recommendations to reduce fat intake is also included.  Nutrition Action Plan  Clinical staff conducted group or individual video education with verbal and written material and guidebook.  Patient learns how to incorporate Pritikin recommendations into their lifestyle. Recommendations include planning and keeping personal health goals in mind as an important part of their success.  Healthy Mind-Set    Healthy Minds, Bodies, Hearts  Clinical staff conducted group or individual video education with verbal and written material and guidebook.  Patient learns how to identify when they are stressed. Video will discuss the impact of that stress, as well as the many benefits of stress management. Patient will also be introduced to stress management techniques. The way we think, act, and feel has an impact on our hearts.  How Our Thoughts Can Heal Our Hearts  Clinical staff conducted group or individual video education with verbal and written material and guidebook.  Patient learns that  negative thoughts can cause depression and anxiety. This can result in negative lifestyle behavior and serious health problems. Cognitive behavioral therapy is an effective method to help control our thoughts in order to change and improve our emotional outlook.  Additional Videos:  Exercise    Improving Performance  Clinical staff conducted group or individual video education with verbal and written material and guidebook.  Patient learns to use a non-linear approach by alternating intensity levels and lengths of time spent exercising to help burn more calories and lose more body fat. Cardiovascular exercise helps improve heart health, metabolism, hormonal balance, blood sugar control, and recovery from fatigue. Resistance training improves strength, endurance, balance, coordination, reaction time, metabolism, and muscle mass. Flexibility exercise improves circulation, posture, and balance. Seek guidance from your physician and exercise physiologist before implementing an exercise routine and learn your capabilities and proper form for all exercise.  Introduction to Yoga  Clinical staff conducted group or individual video education with verbal and written material and guidebook.  Patient learns about yoga, a discipline of the coming together of mind, breath, and body. The benefits of yoga include improved flexibility, improved range of motion, better posture and core strength, increased lung function, weight loss, and positive self-image. Yoga's heart health benefits include lowered blood pressure, healthier heart rate, decreased cholesterol and triglyceride levels, improved immune function, and reduced stress. Seek guidance from your physician and exercise physiologist before implementing an exercise routine and learn your capabilities and proper form for all exercise.  Medical   Aging: Enhancing Your Quality of Life  Clinical staff conducted group or individual video education with verbal and  written material and guidebook.  Patient learns key strategies and recommendations to stay in good physical health and enhance quality of life, such as prevention strategies, having an advocate, securing a [place], and keeping a list of medications and system for tracking them. It also discusses how to avoid risk for bone loss.  Biology of Weight Control  Clinical staff conducted group or individual video education with verbal and written material and guidebook.  Patient learns that weight gain occurs because we consume more calories than we burn (eating more, moving less). Even if your body weight is normal, you may have higher ratios of fat compared to muscle mass. Too much body fat puts you at increased risk for cardiovascular disease, heart attack, stroke, type 2 diabetes, and obesity-related cancers. In addition to exercise, following the [place] can help reduce your risk.  Decoding Lab Results  Clinical staff conducted group or individual video education with verbal and written material and guidebook.  Patient learns that lab test reflects one measurement whose values change over time and are influenced  by many factors, including medication, stress, sleep, exercise, food, hydration, pre-existing medical conditions, and more. It is recommended to use the knowledge from this video to become more involved with your lab results and evaluate your numbers to speak with your doctor.   Diseases of Our Time - Overview  Clinical staff conducted group or individual video education with verbal and written material and guidebook.  Patient learns that according to the CDC, 50% to 70% of chronic diseases (such as obesity, type 2 diabetes, elevated lipids, hypertension, and heart disease) are avoidable through lifestyle improvements including healthier food choices, listening to satiety cues, and increased physical activity.  Sleep Disorders Clinical staff  conducted group or individual video education with verbal and written material and guidebook.  Patient learns how good quality and duration of sleep are important to overall health and well-being. Patient also learns about sleep disorders and how they impact health along with recommendations to address them, including discussing with a physician.  Nutrition  Dining Out - Part 2 Clinical staff conducted group or individual video education with verbal and written material and guidebook.  Patient learns how to plan ahead and communicate in order to maximize their dining experience in a healthy and nutritious manner. Included are recommended food choices based on the type of restaurant the patient is visiting.   Fueling a Best boy conducted group or individual video education with verbal and written material and guidebook.  There is a strong connection between our food choices and our health. Diseases like obesity and type 2 diabetes are very prevalent and are in large-part due to lifestyle choices. The Pritikin Eating Plan provides plenty of food and hunger-curbing satisfaction. It is easy to follow, affordable, and helps reduce health risks.  Menu Workshop  Clinical staff conducted group or individual video education with verbal and written material and guidebook.  Patient learns that restaurant meals can sabotage health goals because they are often packed with calories, fat, sodium, and sugar. Recommendations include strategies to plan ahead and to communicate with the manager, chef, or server to help order a healthier meal.  Planning Your Eating Strategy  Clinical staff conducted group or individual video education with verbal and written material and guidebook.  Patient learns about the [place] and its benefit of reducing the risk of disease. The [place] does not focus on calories. Instead, it emphasizes high-quality, nutrient-rich foods. By knowing  the characteristics of the foods, we choose, we can determine their calorie density and make informed decisions.  Targeting Your Nutrition Priorities  Clinical staff conducted group or individual video education with verbal and written material and guidebook.  Patient learns that lifestyle habits have a tremendous impact on disease risk and progression. This video provides eating and physical activity recommendations based on your personal health goals, such as reducing LDL cholesterol, losing weight, preventing or controlling type 2 diabetes, and reducing high blood pressure.  Vitamins and Minerals  Clinical staff conducted group or individual video education with verbal and written material and guidebook.  Patient learns different ways to obtain key vitamins and minerals, including through a recommended healthy diet. It is important to discuss all supplements you take with your doctor.   Healthy Mind-Set    Smoking Cessation  Clinical staff conducted group or individual video education with verbal and written material and guidebook.  Patient learns that cigarette smoking and tobacco addiction pose a serious health risk which affects millions of people. Stopping smoking will  significantly reduce the risk of heart disease, lung disease, and many forms of cancer. Recommended strategies for quitting are covered, including working with your doctor to develop a successful plan.  Culinary   Becoming a Financial trader conducted group or individual video education with verbal and written material and guidebook.  Patient learns that cooking at home can be healthy, cost-effective, quick, and puts them in control. Keys to cooking healthy recipes will include looking at your recipe, assessing your equipment needs, planning ahead, making it simple, choosing cost-effective seasonal ingredients, and limiting the use of added fats, salts, and sugars.  Cooking - Breakfast and Snacks  Clinical  staff conducted group or individual video education with verbal and written material and guidebook.  Patient learns how important breakfast is to satiety and nutrition through the entire day. Recommendations include key foods to eat during breakfast to help stabilize blood sugar levels and to prevent overeating at meals later in the day. Planning ahead is also a key component.  Cooking - Human resources officer conducted group or individual video education with verbal and written material and guidebook.  Patient learns eating strategies to improve overall health, including an approach to cook more at home. Recommendations include thinking of animal protein as a side on your plate rather than center stage and focusing instead on lower calorie dense options like vegetables, fruits, whole grains, and plant-based proteins, such as beans. Making sauces in large quantities to freeze for later and leaving the skin on your vegetables are also recommended to maximize your experience.  Cooking - Healthy Salads and Dressing Clinical staff conducted group or individual video education with verbal and written material and guidebook.  Patient learns that vegetables, fruits, whole grains, and legumes are the foundations of the [place]. Recommendations include how to incorporate each of these in flavorful and healthy salads, and how to create homemade salad dressings. Proper handling of ingredients is also covered. Cooking - Soups and [employer] - Soups and Desserts Clinical staff conducted group or individual video education with verbal and written material and guidebook.  Patient learns that Pritikin soups and desserts make for easy, nutritious, and delicious snacks and meal components that are low in sodium, fat, sugar, and calorie density, while high in vitamins, minerals, and filling fiber. Recommendations include simple and healthy ideas for soups and desserts.   Overview     The  Pritikin Solution Program Overview Clinical staff conducted group or individual video education with verbal and written material and guidebook.  Patient learns that the results of the [place] Program have been documented in more than 100 articles published in peer-reviewed journals, and the benefits include reducing risk factors for (and, in some cases, even reversing) high cholesterol, high blood pressure, type 2 diabetes, obesity, and more! An overview of the three key pillars of the Pritikin Program will be covered: eating well, doing regular exercise, and having a healthy mind-set.  WORKSHOPS  Exercise: Exercise Basics: Building Your Action Plan Clinical staff led group instruction and group discussion with PowerPoint presentation and patient guidebook. To enhance the learning environment the use of posters, models and videos may be added. At the conclusion of this workshop, patients will comprehend the difference between physical activity and exercise, as well as the benefits of incorporating both, into their routine. Patients will understand the FITT (Frequency, Intensity, Time, and Type) principle and how to use it to build an exercise action plan. In addition, safety  concerns and other considerations for exercise and cardiac rehab will be addressed by the presenter. The purpose of this lesson is to promote a comprehensive and effective weekly exercise routine in order to improve patients' overall level of fitness.   Managing Heart Disease: Your Path to a Healthier Heart Clinical staff led group instruction and group discussion with PowerPoint presentation and patient guidebook. To enhance the learning environment the use of posters, models and videos may be added.At the conclusion of this workshop, patients will understand the anatomy and physiology of the heart. Additionally, they will understand how Pritikin's three pillars impact the risk factors, the progression, and the management  of heart disease.  The purpose of this lesson is to provide a high-level overview of the heart, heart disease, and how the Pritikin lifestyle positively impacts risk factors.  Exercise Biomechanics Clinical staff led group instruction and group discussion with PowerPoint presentation and patient guidebook. To enhance the learning environment the use of posters, models and videos may be added. Patients will learn how the structural parts of their bodies function and how these functions impact their daily activities, movement, and exercise. Patients will learn how to promote a neutral spine, learn how to manage pain, and identify ways to improve their physical movement in order to promote healthy living. The purpose of this lesson is to expose patients to common physical limitations that impact physical activity. Participants will learn practical ways to adapt and manage aches and pains, and to minimize their effect on regular exercise. Patients will learn how to maintain good posture while sitting, walking, and lifting.  Balance Training and Fall Prevention  Clinical staff led group instruction and group discussion with PowerPoint presentation and patient guidebook. To enhance the learning environment the use of posters, models and videos may be added. At the conclusion of this workshop, patients will understand the importance of their sensorimotor skills (vision, proprioception, and the vestibular system) in maintaining their ability to balance as they age. Patients will apply a variety of balancing exercises that are appropriate for their current level of function. Patients will understand the common causes for poor balance, possible solutions to these problems, and ways to modify their physical environment in order to minimize their fall risk. The purpose of this lesson is to teach patients about the importance of maintaining balance as they age and ways to minimize their risk of  falling.  WORKSHOPS   Nutrition:  Fueling a Scientist, research (physical sciences) led group instruction and group discussion with PowerPoint presentation and patient guidebook. To enhance the learning environment the use of posters, models and videos may be added. Patients will review the foundational principles of the [place] and understand what constitutes a serving size in each of the food groups. Patients will also learn Pritikin-friendly foods that are better choices when away from home and review make-ahead meal and snack options. Calorie density will be reviewed and applied to three nutrition priorities: weight maintenance, weight loss, and weight gain. The purpose of this lesson is to reinforce (in a group setting) the key concepts around what patients are recommended to eat and how to apply these guidelines when away from home by planning and selecting Pritikin-friendly options. Patients will understand how calorie density may be adjusted for different weight management goals.  Mindful Eating  Clinical staff led group instruction and group discussion with PowerPoint presentation and patient guidebook. To enhance the learning environment the use of posters, models and videos may be added. Patients will  briefly review the concepts of the [place] and the importance of low-calorie dense foods. The concept of mindful eating will be introduced as well as the importance of paying attention to internal hunger signals. Triggers for non-hunger eating and techniques for dealing with triggers will be explored. The purpose of this lesson is to provide patients with the opportunity to review the basic principles of the [place], discuss the value of eating mindfully and how to measure internal cues of hunger and fullness using the Hunger Scale. Patients will also discuss reasons for non-hunger eating and learn strategies to use for controlling emotional eating.  Targeting Your  Nutrition Priorities Clinical staff led group instruction and group discussion with PowerPoint presentation and patient guidebook. To enhance the learning environment the use of posters, models and videos may be added. Patients will learn how to determine their genetic susceptibility to disease by reviewing their family history. Patients will gain insight into the importance of diet as part of an overall healthy lifestyle in mitigating the impact of genetics and other environmental insults. The purpose of this lesson is to provide patients with the opportunity to assess their personal nutrition priorities by looking at their family history, their own health history and current risk factors. Patients will also be able to discuss ways of prioritizing and modifying the [place] for their highest risk areas  Menu  Clinical staff led group instruction and group discussion with PowerPoint presentation and patient guidebook. To enhance the learning environment the use of posters, models and videos may be added. Using menus brought in from [employer], or printed from [employer], patients will apply the [place] dining out guidelines that were presented in the [employer] video. Patients will also be able to practice these guidelines in a variety of provided scenarios. The purpose of this lesson is to provide patients with the opportunity to practice hands-on learning of the [place] with actual menus and practice scenarios.  Label Reading Clinical staff led group instruction and group discussion with PowerPoint presentation and patient guidebook. To enhance the learning environment the use of posters, models and videos may be added. Patients will review and discuss the Pritikin label reading guidelines presented in Pritikin's Label Reading Educational series video. Using fool labels brought in from local grocery stores and markets, patients will apply the  label reading guidelines and determine if the packaged food meet the Pritikin guidelines. The purpose of this lesson is to provide patients with the opportunity to review, discuss, and practice hands-on learning of the Pritikin Label Reading guidelines with actual packaged food labels. [place] Workshops are designed to teach patients ways to prepare quick, simple, and affordable recipes at home. The importance of nutrition's role in chronic disease risk reduction is reflected in its emphasis in the overall Pritikin program. By learning how to prepare essential core Pritikin Eating Plan recipes, patients will increase control over what they eat; be able to customize the flavor of foods without the use of added salt, sugar, or fat; and improve the quality of the food they consume. By learning a set of core recipes which are easily assembled, quickly prepared, and affordable, patients are more likely to prepare more healthy foods at home. These workshops focus on convenient breakfasts, simple entres, side dishes, and desserts which can be prepared with minimal effort and are consistent with nutrition recommendations for cardiovascular risk reduction. Cooking [employer] are taught by  a Engineer, materials (RD) who has been trained by the [employer] team. The chef or RD has a clear understanding of the importance of minimizing - if not completely eliminating - added fat, sugar, and sodium in recipes. Throughout the series of [place] Workshop sessions, patients will learn about healthy ingredients and efficient methods of cooking to build confidence in their capability to prepare    Cooking School weekly topics:  Adding Flavor- Sodium-Free  Fast and Healthy Breakfasts  Powerhouse Plant-Based Proteins  Satisfying Salads and Dressings  Simple Sides and Sauces  International Cuisine-Spotlight on the [place] Zones  Delicious Desserts  Savory  Soups  [employer] - Meals in a Agricultural consultant Appetizers and Snacks  Comforting Weekend Breakfasts  One-Pot Wonders   Fast Evening Meals  Contractor Your Pritikin Plate  WORKSHOPS   Healthy Mindset (Psychosocial):  Focused Goals, Sustainable Changes Clinical staff led group instruction and group discussion with PowerPoint presentation and patient guidebook. To enhance the learning environment the use of posters, models and videos may be added. Patients will be able to apply effective goal setting strategies to establish at least one personal goal, and then take consistent, meaningful action toward that goal. They will learn to identify common barriers to achieving personal goals and develop strategies to overcome them. Patients will also gain an understanding of how our mind-set can impact our ability to achieve goals and the importance of cultivating a positive and growth-oriented mind-set. The purpose of this lesson is to provide patients with a deeper understanding of how to set and achieve personal goals, as well as the tools and strategies needed to overcome common obstacles which may arise along the way.  From Head to Heart: The Power of a Healthy Outlook  Clinical staff led group instruction and group discussion with PowerPoint presentation and patient guidebook. To enhance the learning environment the use of posters, models and videos may be added. Patients will be able to recognize and describe the impact of emotions and mood on physical health. They will discover the importance of self-care and explore self-care practices which may work for them. Patients will also learn how to utilize the 4 C's to cultivate a healthier outlook and better manage stress and challenges. The purpose of this lesson is to demonstrate to patients how a healthy outlook is an essential part of maintaining good health, especially as they continue their cardiac rehab journey.  Healthy  Sleep for a Healthy Heart Clinical staff led group instruction and group discussion with PowerPoint presentation and patient guidebook. To enhance the learning environment the use of posters, models and videos may be added. At the conclusion of this workshop, patients will be able to demonstrate knowledge of the importance of sleep to overall health, well-being, and quality of life. They will understand the symptoms of, and treatments for, common sleep disorders. Patients will also be able to identify daytime and nighttime behaviors which impact sleep, and they will be able to apply these tools to help manage sleep-related challenges. The purpose of this lesson is to provide patients with a general overview of sleep and outline the importance of quality sleep. Patients will learn about a few of the most common sleep disorders. Patients will also be introduced to the concept of "sleep hygiene," and discover ways to self-manage certain sleeping problems through simple daily behavior changes. Finally, the workshop will motivate patients by clarifying the links between quality sleep and their goals of heart-healthy  living.   Recognizing and Reducing Stress Clinical staff led group instruction and group discussion with PowerPoint presentation and patient guidebook. To enhance the learning environment the use of posters, models and videos may be added. At the conclusion of this workshop, patients will be able to understand the types of stress reactions, differentiate between acute and chronic stress, and recognize the impact that chronic stress has on their health. They will also be able to apply different coping mechanisms, such as reframing negative self-talk. Patients will have the opportunity to practice a variety of stress management techniques, such as deep abdominal breathing, progressive muscle relaxation, and/or guided imagery.  The purpose of this lesson is to educate patients on the role of stress in their  lives and to provide healthy techniques for coping with it.  Learning Barriers/Preferences:  Learning Barriers/Preferences - 01/11/22 1014       Learning Barriers/Preferences   Learning Barriers Sight   wears glasses-   Learning Preferences Audio;Written Material;Computer/Internet;Group Instruction;Individual Instruction;Pictoral;Skilled Demonstration;Verbal Instruction;Video             Education Topics:  Knowledge Questionnaire Score:  Knowledge Questionnaire Score - 03/23/22 1712       Knowledge Questionnaire Score   Post Score 23/24             Core Components/Risk Factors/Patient Goals at Admission:  Personal Goals and Risk Factors at Admission - 01/11/22 1012       Core Components/Risk Factors/Patient Goals on Admission    Weight Management Yes;Obesity;Weight Loss    Intervention Weight Management: Develop a combined nutrition and exercise program designed to reach desired caloric intake, while maintaining appropriate intake of nutrient and fiber, sodium and fats, and appropriate energy expenditure required for the weight goal.;Weight Management: Provide education and appropriate resources to help participant work on and attain dietary goals.;Weight Management/Obesity: Establish reasonable short term and long term weight goals.;Obesity: Provide education and appropriate resources to help participant work on and attain dietary goals.    Admit Weight 204 lb 9.4 oz (92.8 kg)    Goal Weight: Long Term 170 lb (77.1 kg)   Pt goal   Hypertension Yes    Intervention Provide education on lifestyle modifcations including regular physical activity/exercise, weight management, moderate sodium restriction and increased consumption of fresh fruit, vegetables, and low fat dairy, alcohol moderation, and smoking cessation.;Monitor prescription use compliance.    Expected Outcomes Short Term: Continued assessment and intervention until BP is < 140/21mm HG in hypertensive participants. <  130/64mm HG in hypertensive participants with diabetes, heart failure or chronic kidney disease.;Long Term: Maintenance of blood pressure at goal levels.    Lipids Yes    Intervention Provide education and support for participant on nutrition & aerobic/resistive exercise along with prescribed medications to achieve LDL 70mg , HDL >40mg .    Expected Outcomes Long Term: Cholesterol controlled with medications as prescribed, with individualized exercise RX and with personalized nutrition plan. Value goals: LDL < 70mg , HDL > 40 mg.;Short Term: Participant states understanding of desired cholesterol values and is compliant with medications prescribed. Participant is following exercise prescription and nutrition guidelines.    Stress Yes    Intervention Offer individual and/or small group education and counseling on adjustment to heart disease, stress management and health-related lifestyle change. Teach and support self-help strategies.;Refer participants experiencing significant psychosocial distress to appropriate mental health specialists for further evaluation and treatment. When possible, include family members and significant others in education/counseling sessions.    Expected Outcomes Short Term: Participant demonstrates changes  in health-related behavior, relaxation and other stress management skills, ability to obtain effective social support, and compliance with psychotropic medications if prescribed.;Long Term: Emotional wellbeing is indicated by absence of clinically significant psychosocial distress or social isolation.             Core Components/Risk Factors/Patient Goals Review:   Goals and Risk Factor Review     Row Name 01/18/22 0756 02/02/22 1521 03/01/22 1052 03/24/22 0755       Core Components/Risk Factors/Patient Goals Review   Personal Goals Review Weight Management/Obesity;Hypertension;Lipids;Stress Weight Management/Obesity;Hypertension;Lipids;Stress Weight  Management/Obesity;Hypertension;Lipids;Stress Weight Management/Obesity;Hypertension;Lipids;Stress    Review Siegfried started intensive cardiac rehab on 01/17/22 and did well with exercise. Aarn did report having two episodes of a twinge in his chest denied actual pain.Onsite provider notified. Vital signs stable Caidence has been doing well with exercise at intensive cardiac rehab. Jonhatan has not voiced any reports of his anginal eqivalent. Vital signs have been stable. 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 has lost 2.6 kg since starting the program Duilio has been doing well with exercise at intensive cardiac rehab. Desi has not voiced any reports of his anginal eqivalent recently . Vital signs have been stable. 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 has lost 3.9  kg since starting the program. 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 will complete intensive cardiac rehab on 03/11/22 Glennon has been doing well with exercise at intensive cardiac rehab . Vital signs have been stable. 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 has lost 5.3  kg since starting the program. 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 will complete intensive cardiac rehab on 03/25/22    Expected Outcomes Raoul will continue to participate in intensive cardiac rehab for exercise, nutrition and lifestyle modifications Jabori will continue to participate in intensive cardiac rehab for exercise, nutrition and lifestyle modifications Ramero will continue to participate in intensive cardiac rehab for exercise, nutrition and lifestyle modifications Berlie will continue to participate in intensive cardiac rehab for exercise, nutrition and lifestyle modifications             Core Components/Risk Factors/Patient Goals at Discharge (Final Review):   Goals and Risk Factor Review - 03/24/22 0755       Core Components/Risk Factors/Patient Goals Review   Personal Goals Review Weight Management/Obesity;Hypertension;Lipids;Stress    Review Keagen has been doing well with exercise at intensive cardiac rehab . Vital signs have been stable. 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 has lost 5.3  kg since starting the program. 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 will  complete intensive cardiac rehab on 03/25/22    Expected Outcomes Ramsey will continue to participate in intensive cardiac rehab for exercise, nutrition and lifestyle modifications             ITP Comments:  ITP Comments     Row Name 01/11/22 0907 01/18/22 0746 02/02/22 1518 03/01/22 1050 03/24/22 0752   ITP Comments Dr Fransico Him MD, Medical Director. Introduction to Pritikin Education Program/ Intensive Cardiac Rehab. Initial orientation packet reviewed with the patient 30 Day ITP Review. Cache started intensive cardiac rehab on 01/18/22 and did well with exercise. 30 Day ITP Review. Martel has good attendance and participation in intensive cardiac rehab 30 Day ITP Review. Kyland has good attendance and participation in intensive cardiac rehab. Dhruvin will complete intensive cardiac rehab on 03/11/22 30 Day ITP Review. Qadir has good attendance and participation in intensive cardiac rehab. Kaden will complete intensive cardiac rehab on 03/25/22            Comments: See ITP comments.Harrell Gave RN BSN

## 2022-03-25 ENCOUNTER — Encounter (HOSPITAL_COMMUNITY)
Admission: RE | Admit: 2022-03-25 | Discharge: 2022-03-25 | Disposition: A | Discharge status: Home / Self Care | Payer: BC Managed Care – PPO | Source: Ambulatory Visit | Attending: Internal Medicine | Admitting: Internal Medicine

## 2022-03-25 VITALS — Ht 65.75 in | Wt 189.8 lb

## 2022-03-25 DIAGNOSIS — I252 Old myocardial infarction: Secondary | ICD-10-CM | POA: Diagnosis not present

## 2022-03-25 DIAGNOSIS — Z5189 Encounter for other specified aftercare: Secondary | ICD-10-CM | POA: Diagnosis not present

## 2022-03-25 DIAGNOSIS — E785 Hyperlipidemia, unspecified: Secondary | ICD-10-CM | POA: Diagnosis not present

## 2022-03-25 DIAGNOSIS — I25119 Atherosclerotic heart disease of native coronary artery with unspecified angina pectoris: Secondary | ICD-10-CM | POA: Diagnosis not present

## 2022-03-25 DIAGNOSIS — I2089 Other forms of angina pectoris: Secondary | ICD-10-CM

## 2022-03-25 DIAGNOSIS — I214 Non-ST elevation (NSTEMI) myocardial infarction: Secondary | ICD-10-CM

## 2022-03-25 DIAGNOSIS — E7800 Pure hypercholesterolemia, unspecified: Secondary | ICD-10-CM | POA: Diagnosis not present

## 2022-03-25 DIAGNOSIS — I10 Essential (primary) hypertension: Secondary | ICD-10-CM | POA: Diagnosis not present

## 2022-03-25 NOTE — Progress Notes (Signed)
Discharge Progress Report  Patient Details  Name: Manuel Barker MRN: [id number] Date of Birth: Mar 18, 1950 Referring Provider:   Flowsheet Row INTENSIVE CARDIAC REHAB ORIENT from 01/11/2022 in [facility] [place]  Referring Provider [name], MD        Number of Visits: 48  Reason for Discharge:  Patient reached a stable level of exercise. Patient independent in their exercise. Patient has met program and personal goals.  Smoking History:  Social History   Tobacco Use  Smoking Status Never  Smokeless Tobacco Never    Diagnosis:  Chronic stable angina  NSTEMI (non-ST elevated myocardial infarction) (Manuel Barker)  ADL [facility]:   Initial Exercise Prescription:  Initial Exercise Prescription - 01/11/22 1000       Date of Initial Exercise RX and Referring Provider   Date 01/11/22    Referring Provider [name], MD    Expected Discharge Date 03/11/22      NuStep   Level 1    SPM 75    Minutes 15    METs 3      Track   Laps 10    Minutes 15    METs 3      Prescription Details   Frequency (times per week) 3    Duration Progress to 30 minutes of continuous aerobic without signs/symptoms of physical distress      Intensity   THRR 40-80% of Max Heartrate 60-119    Ratings of Perceived Exertion 11-13    Perceived Dyspnea 0-4      Progression   Progression Continue progressive overload as per policy without signs/symptoms or physical distress.      Resistance Training   Training Prescription Yes    Weight 3 lbs    Reps 10-15             Discharge Exercise Prescription (Final Exercise Prescription Changes):  Exercise Prescription Changes - 03/25/22 1657       Response to Exercise   Blood Pressure (Admit) 122/78    Blood Pressure (Exercise) 130/66    Blood Pressure (Exit) 102/60    Heart Rate (Admit) 72 bpm    Heart Rate (Exercise) 135 bpm    Heart Rate (Exit) 82 bpm    Rating of Perceived Exertion  (Exercise) 12    Perceived Dyspnea (Exercise) 0    Symptoms none    Comments Manuel Barker graduated teh CRP2 program    Duration Progress to 30 minutes of  aerobic without signs/symptoms of physical distress    Intensity THRR unchanged      Progression   Progression Continue to progress workloads to maintain intensity without signs/symptoms of physical distress.    Average METs 4.57      Resistance Training   Training Prescription Yes    Weight 4 lbs wts    Reps 10-15    Time 10 Minutes      NuStep   Level 3    SPM 179    Minutes 15    METs 5      Track   Laps --   2160 ft on post 6MWT   Minutes 6    METs 4.13      Home Exercise Plan   Plans to continue exercise at Home (comment)    Frequency Add 3 additional days to program exercise sessions.    Initial Home Exercises Provided 02/11/22             Functional Capacity:  6 Minute Walk     Row Name 01/11/22 0910 03/25/22 1630 04/07/22 1552     6 Minute Walk   Phase Initial -- --   Distance 1602 feet 2160 feet --   Distance % Change -- 34.83 % --   Distance Feet Change -- 558 ft --   Walk Time 6 minutes 6 minutes --   # of Rest Breaks 0 -- --   MPH 3.03 -- --   METS 3.05 -- --   RPE 10 11 --   Perceived Dyspnea  0 0 --   VO2 Peak 10.7 -- --   Symptoms No No --   Resting HR 70 bpm 72 bpm --   Resting BP 122/72 122/78 --   Resting Oxygen Saturation  98 % -- --   Exercise Oxygen Saturation  during 6 min walk 97 % -- --   Max Ex. HR 92 bpm 119 bpm --   Max Ex. BP 146/70 130/66 --   2 Minute Post BP 116/80 -- --            Psychological, QOL, Others - Outcomes: PHQ 2/9:    03/25/2022    4:20 PM 01/11/2022   10:25 AM 11/11/2021    1:41 PM 04/27/2021   10:05 AM 08/08/2019   10:57 AM  Depression screen PHQ 2/9  Decreased Interest 0 0 0 0 0  Down, Depressed, Hopeless 0 0 0 0 0  PHQ - 2 Score 0 0 0 0 0  Altered sleeping 0 1 0 0 0  Tired, decreased energy 0 0 1 0 0  Change in appetite 0 0 0 0 0  Feeling bad or  failure about yourself  0 0 0 0 0  Trouble concentrating 0 0 0 0 0  Moving slowly or fidgety/restless 0 0 0 0 0  Suicidal thoughts 0 0 0 0 0  PHQ-9 Score 0 1 1 0 0  Difficult doing work/chores Not difficult at all Not difficult at all Not difficult at all Not difficult at all Not difficult at all    Quality of Life:  Quality of Life - 03/23/22 1712       Quality of Life   Select Quality of Life      Quality of Life Scores   Health/Function Post 21.83 %    Socioeconomic Post 22.43 %    Psych/Spiritual Post 21.71 %    Family Post 20 %    GLOBAL Post 21.71 %             Personal Goals: Goals established at orientation with interventions provided to work toward goal.  Personal Goals and Risk Factors at Admission - 01/11/22 1012       Core Components/Risk Factors/Patient Goals on Admission    Weight Management Yes;Obesity;Weight Loss    Intervention Weight Management: Develop a combined nutrition and exercise program designed to reach desired caloric intake, while maintaining appropriate intake of nutrient and fiber, sodium and fats, and appropriate energy expenditure required for the weight goal.;Weight Management: Provide education and appropriate resources to help participant work on and attain dietary goals.;Weight Management/Obesity: Establish reasonable short term and long term weight goals.;Obesity: Provide education and appropriate resources to help participant work on and attain dietary goals.    Admit Weight 204 lb 9.4 oz (92.8 kg)    Goal Weight: Long Term 170 lb (77.1 kg)   Manuel Barker goal   Hypertension Yes    Intervention Provide education on lifestyle modifcations including  regular physical activity/exercise, weight management, moderate sodium restriction and increased consumption of fresh fruit, vegetables, and low fat dairy, alcohol moderation, and smoking cessation.;Monitor prescription use compliance.    Expected Outcomes Short Term: Continued assessment and intervention  until BP is < 140/36mm HG in hypertensive participants. < 130/55mm HG in hypertensive participants with diabetes, heart failure or chronic kidney disease.;Long Term: Maintenance of blood pressure at goal levels.    Lipids Yes    Intervention Provide education and support for participant on nutrition & aerobic/resistive exercise along with prescribed medications to achieve LDL 70mg , HDL >40mg .    Expected Outcomes Long Term: Cholesterol controlled with medications as prescribed, with individualized exercise RX and with personalized nutrition plan. Value goals: LDL < , HDL > 40 mg.;Short Term: Participant states understanding of desired cholesterol values and is compliant with medications prescribed. Participant is following exercise prescription and nutrition guidelines.    Stress Yes    Intervention Offer individual and/or small group education and counseling on adjustment to heart disease, stress management and health-related lifestyle change. Teach and support self-help strategies.;Refer participants experiencing significant psychosocial distress to appropriate mental health specialists for further evaluation and treatment. When possible, include family members and significant others in education/counseling sessions.    Expected Outcomes Short Term: Participant demonstrates changes in health-related behavior, relaxation and other stress management skills, ability to obtain effective social support, and compliance with psychotropic medications if prescribed.;Long Term: Emotional wellbeing is indicated by absence of clinically significant psychosocial distress or social isolation.              Personal Goals Discharge:  Goals and Risk Factor Review     Row Name 01/18/22 0756 02/02/22 1521 03/01/22 1052 03/24/22 0755       Core Components/Risk Factors/Patient Goals Review   Personal Goals Review Weight Management/Obesity;Hypertension;Lipids;Stress Weight  Management/Obesity;Hypertension;Lipids;Stress Weight Management/Obesity;Hypertension;Lipids;Stress Weight Management/Obesity;Hypertension;Lipids;Stress    Review Manuel Barker started intensive cardiac rehab on 01/17/22 and did well with exercise. Manuel Barker did report having two episodes of a twinge in his chest denied actual pain.Onsite provider notified. Vital signs stable Manuel Barker has been doing well with exercise at intensive cardiac rehab. Manuel Barker has not voiced any reports of his anginal eqivalent. Vital signs have been stable. Manuel Barker has lost 2.6 kg since starting the program Manuel Barker has been doing well with exercise at intensive cardiac rehab. Manuel Barker has not voiced any reports of his anginal eqivalent recently . Vital signs have been stable. Manuel Barker has lost 3.9  kg since starting the program. Manuel Barker will complete intensive cardiac rehab on 03/11/22 Manuel Barker has been doing well with exercise at intensive cardiac rehab . Vital signs have been stable. Manuel Barker has lost 5.3  kg since starting the program. Manuel Barker will complete intensive cardiac rehab on 03/25/22    Expected Outcomes Manuel Barker will continue to participate in intensive cardiac rehab for exercise, nutrition and lifestyle modifications Manuel Barker will continue to participate in intensive cardiac rehab for exercise, nutrition and lifestyle modifications Manuel Barker will continue to participate in intensive cardiac rehab for exercise, nutrition and lifestyle modifications Manuel Barker will continue to participate in intensive cardiac rehab for exercise, nutrition and lifestyle modifications             Exercise Goals and Review:  Exercise Goals     Row Name 01/11/22 1008             Exercise Goals   Increase Physical Activity Yes       Intervention Provide advice, education, support  and counseling about physical activity/exercise needs.;Develop an individualized exercise prescription for aerobic and resistive training based on initial evaluation findings, risk  stratification, comorbidities and participant's personal goals.       Expected Outcomes Short Term: Attend rehab on a regular basis to increase amount of physical activity.;Long Term: Add in home exercise to make exercise part of routine and to increase amount of physical activity.;Long Term: Exercising regularly at least 3-5 days a week.       Increase Strength and Stamina Yes       Intervention Provide advice, education, support and counseling about physical activity/exercise needs.;Develop an individualized exercise prescription for aerobic and resistive training based on initial evaluation findings, risk stratification, comorbidities and participant's personal goals.       Expected Outcomes Short Term: Increase workloads from initial exercise prescription for resistance, speed, and METs.;Short Term: Perform resistance training exercises routinely during rehab and add in resistance training at home;Long Term: Improve cardiorespiratory fitness, muscular endurance and strength as measured by increased METs and functional capacity ( )       Able to understand and use rate of perceived exertion (RPE) scale Yes       Intervention Provide education and explanation on how to use RPE scale       Expected Outcomes Short Term: Able to use RPE daily in rehab to express subjective intensity level;Long Term:  Able to use RPE to guide intensity level when exercising independently       Knowledge and understanding of Target Heart Rate Range (THRR) Yes       Intervention Provide education and explanation of THRR including how the numbers were predicted and where they are located for reference       Expected Outcomes Short Term: Able to state/look up THRR;Short Term: Able to use daily as guideline for intensity in rehab;Long Term: Able to use THRR to govern intensity when exercising independently       Understanding of Exercise Prescription Yes       Intervention Provide education, explanation, and written  materials on patient's individual exercise prescription       Expected Outcomes Short Term: Able to explain program exercise prescription;Long Term: Able to explain home exercise prescription to exercise independently                Exercise Goals Re-Evaluation:  Exercise Goals Re-Evaluation     Row Name 01/17/22 1634 02/11/22 1630 03/25/22 1646         Exercise Goal Re-Evaluation   Exercise Goals Review Increase Physical Activity;Understanding of Exercise Prescription;Increase Strength and Stamina;Knowledge and understanding of Target Heart Rate Range (THRR);Able to understand and use rate of perceived exertion (RPE) scale Increase Physical Activity;Understanding of Exercise Prescription;Increase Strength and Stamina;Knowledge and understanding of Target Heart Rate Range (THRR);Able to understand and use rate of perceived exertion (RPE) scale Increase Physical Activity;Understanding of Exercise Prescription;Increase Strength and Stamina;Knowledge and understanding of Target Heart Rate Range (THRR);Able to understand and use rate of perceived exertion (RPE) scale     Comments Manuel Barker first day in the CRP2 program. Manuel Barker tolerated exercise well with an average MET level of 2.85. Manuel Barker is learning his THRR, RPE and ExRx Reviewed MET's, goals and home ExRx. Manuel Barker tolerated exercise well with an average MET level of 3.6. Manuel Barker will exercise by adding in walking for 30-45 min per session 3 days a week. Manuel Barker feels good about his goals and will continue to work on [employer] nd stamina. Manuel Barker graduated teh [employer]  Program today. Manuel Barker tolerated exercise well with an average MET level of 3.62. Manuel Barker will continue to exercise on his own by walking, using his cardio glide and Treadmill for 30-45 mins per session 5-7 days.     Expected Outcomes Will continue to monitor Manuel Barker and progress workloads as tolerated without sign or symptom Will continue to monitor Manuel Barker and progress workloads as tolerated without sign or symptom Manuel Barker  will continue to exercise on his own and gain strength              Nutrition & Weight - Outcomes:  Pre Biometrics - 03/25/22 1651       Pre Biometrics   Height 5' 5.75" (1.67 m)    Weight 86.1 kg    Waist Circumference 40 inches    Hip Circumference 44 inches    Waist to Hip Ratio 0.91 %    BMI (Calculated) 30.87    Triceps Skinfold 11 mm    % Body Fat 27.8 %    Grip Strength 45 kg    Flexibility 0 in    Single Leg Stand 17 seconds              Nutrition:  Nutrition Therapy & Goals - 03/17/22 1011       Nutrition Therapy   Diet Heart healthy Diet      Personal Nutrition Goals   Nutrition Goal Patient to identify strategies for reducing cardiovascular risk by attending the weekly [employer] education and nutrition series.    Personal Goal #2 Patient to improve diet quality by using the plate method as a daily guide for meal planning to includin lean protein/plant protein, fruit, vegetables, whole grains, and nonfat dairy as part of a balanced diet    Comments Goals in action. Manuel Barker has starting making many dietary changes including reduced saturated fat, increasing dietary fiber, and reading food labels.  He continues to attend the [facility] education and nutrition series regularly. He is down 11# since starting with our program. Manuel Barker will continue to benefit from participation in intensive cardiac rehab to aid with nutrition education, exercise support, and lifestyle modification.      Intervention Plan   Intervention Prescribe, educate and counsel regarding individualized specific dietary modifications aiming towards targeted core components such as weight, hypertension, lipid management, diabetes, heart failure and other comorbidities.;Nutrition handout(s) given to patient.    Expected Outcomes Short Term Goal: Understand basic principles of dietary content, such as calories, fat, sodium, cholesterol and nutrients.;Long Term Goal: Adherence to prescribed nutrition  plan.             Nutrition Discharge:  Nutrition Assessments - 03/25/22 1042       Rate Your Plate Scores   Post Score 88             Education Questionnaire Score:  Knowledge Questionnaire Score - 03/23/22 1712       Knowledge Questionnaire Score   Post Score 23/24             Goals reviewed with patient; copy given to patient.Manuel Barker graduates from  Intensive cardiac rehab program 03/25/22  with completion of  53 exercise and education sessions. Manuel Barker maintained good attendance and progressed nicely during their participation in rehab as evidenced by increased MET level.   Medication list reconciled. Repeat  PHQ score- 0 .  Manuel Barker has made significant lifestyle changes and should be commended for their success. Manuel Barker achieved their goals during cardiac rehab.   Manuel Barker plans to  continue exercise by walking and using his cardioglide. We are proud of Manuel Barker's progress! Manuel Barker increased his distance on his post exercise walk test by 558 feet. Manuel Barker lost 6.7 kg during his participation in the program! Manuel Headings RN BSN

## 2022-03-29 ENCOUNTER — Other Ambulatory Visit: Payer: Self-pay | Admitting: Internal Medicine

## 2022-04-15 ENCOUNTER — Ambulatory Visit (INDEPENDENT_AMBULATORY_CARE_PROVIDER_SITE_OTHER): Payer: BC Managed Care – PPO | Admitting: Nurse Practitioner

## 2022-04-15 ENCOUNTER — Encounter: Payer: Self-pay | Admitting: Nurse Practitioner

## 2022-04-15 VITALS — BP 118/84 | HR 61 | Temp 98.0°F | Resp 16 | Ht 65.75 in | Wt 193.2 lb

## 2022-04-15 DIAGNOSIS — N401 Enlarged prostate with lower urinary tract symptoms: Secondary | ICD-10-CM | POA: Diagnosis not present

## 2022-04-15 DIAGNOSIS — R350 Frequency of micturition: Secondary | ICD-10-CM | POA: Diagnosis not present

## 2022-04-15 DIAGNOSIS — N3001 Acute cystitis with hematuria: Secondary | ICD-10-CM

## 2022-04-15 LAB — POCT URINALYSIS DIPSTICK
Bilirubin, UA: NEGATIVE
Glucose, UA: NEGATIVE
Ketones, UA: NEGATIVE
Leukocytes, UA: NEGATIVE
Nitrite, UA: NEGATIVE
Protein, UA: NEGATIVE
Spec Grav, UA: 1.015 (ref 1.010–1.025)
Urobilinogen, UA: 0.2 E.U./dL
pH, UA: 6.5 (ref 5.0–8.0)

## 2022-04-15 MED ORDER — CIPROFLOXACIN HCL 500 MG PO TABS
500.0000 mg | ORAL_TABLET | Freq: Two times a day (BID) | ORAL | 0 refills | Status: DC
Start: 2022-04-15 — End: 2022-04-17

## 2022-04-15 MED ORDER — DOXYCYCLINE HYCLATE 100 MG PO TABS
100.0000 mg | ORAL_TABLET | Freq: Two times a day (BID) | ORAL | 0 refills | Status: DC
Start: 2022-04-15 — End: 2022-04-15

## 2022-04-15 NOTE — Progress Notes (Signed)
Acute Office Visit  Subjective:    Patient ID: Manuel Barker, male    DOB: [birth year], 72 y.o.   MRN: [id number]  Chief Complaint  Patient presents with   Dysuria    Frequency,and cloudy urine for 2-3 days    Dysuria  Associated symptoms include frequency and urgency. Pertinent negatives include no flank pain or hematuria.   Patient is in today for urinary frequency and cloudy urine x 3days, no fever, no dysuria, no retention, no back pain. Hx of prostate cancer and BPH, s/p radiation, under the care of  [facility]: Dr. Retta Diones.   Outpatient Medications Prior to Visit  Medication Sig   amLODipine (NORVASC) 10 MG tablet TAKE 1 TABLET(10 MG) BY MOUTH DAILY   aspirin 81 MG tablet Take 1 tablet (81 mg total) by mouth daily.   Cholecalciferol 100 MCG (4000 UT) CAPS Take 1 capsule (4,000 Units total) by mouth daily in the afternoon.   clopidogrel (PLAVIX) 75 MG tablet TAKE 1 TABLET(75 MG) BY MOUTH DAILY   ezetimibe (ZETIA) 10 MG tablet TAKE 1 TABLET(10 MG) BY MOUTH DAILY   metoprolol succinate (TOPROL-XL) 50 MG 24 hr tablet Take 1 tablet (50 mg total) by mouth daily.   nitroGLYCERIN (NITROSTAT) 0.4 MG SL tablet PLACE 1 TABLET UNDER THE TONGUE AS NEEDED FOR CHEST PAIN EVERY 5 MINUTES FOR 3 DOSES IF NO RELIEF AFTER FIRST DOSE CALL 911   rosuvastatin (CRESTOR) 40 MG tablet TAKE 1 TABLET(40 MG) BY MOUTH AT BEDTIME   telmisartan (MICARDIS) 40 MG tablet TAKE 1 TABLET(40 MG) BY MOUTH DAILY   Facility-Administered Medications Prior to Visit  Medication Dose Route Frequency Provider   sodium chloride flush (NS) 0.9 % injection 3 mL  3 mL Intravenous Q12H Gaston Islam., NP   sodium phosphate (FLEET) 7-19 GM/118ML enema 1 enema  1 enema Rectal Once Marcine Matar, MD   Reviewed past medical and social history.   Review of Systems  Gastrointestinal:  Negative for blood in stool, constipation and diarrhea.  Genitourinary:  Positive for frequency and urgency. Negative for  decreased urine volume, dysuria, flank pain, hematuria, scrotal swelling and testicular pain.      Objective:    Physical Exam Vitals reviewed.  Constitutional:      General: He is not in acute distress. Abdominal:     General: There is no distension.     Palpations: Abdomen is soft.     Tenderness: There is no abdominal tenderness. There is no left CVA tenderness or guarding.  Neurological:     Mental Status: He is alert and oriented to person, place, and time.    BP 118/84 (BP Location: Left Arm, Patient Position: Sitting, Cuff Size: Large)   Pulse 61   Temp 98 F (36.7 C) (Temporal)   Resp 16   Ht 5' 5.75" (1.67 m)   Wt 193 lb 3.2 oz (87.6 kg)   SpO2 98%   BMI 31.42 kg/m    Results for orders placed or performed in visit on 04/15/22  POCT urinalysis dipstick  Result Value Ref Range   Color, UA yellow    Clarity, UA slightly cloudy    Glucose, UA Negative Negative   Bilirubin, UA negative    Ketones, UA negative    Spec Grav, UA 1.015 1.010 - 1.025   Blood, UA 2+    pH, UA 6.5 5.0 - 8.0   Protein, UA Negative Negative   Urobilinogen, UA 0.2 0.2 or 1.0 E.U./dL  Nitrite, UA neg    Leukocytes, UA Negative Negative   Appearance     Odor        Assessment & Plan:   Problem List Items Addressed This Visit       Genitourinary   Enlarged prostate with lower urinary tract symptoms (LUTS)   Relevant Medications   ciprofloxacin (CIPRO) 500 MG tablet   Other Relevant Orders   POCT urinalysis dipstick (Completed)   Urine Culture     Other   URINARY URGENCY - Primary   Relevant Medications   ciprofloxacin (CIPRO) 500 MG tablet   Meds ordered this encounter  Medications   DISCONTD: doxycycline (VIBRA-TABS) 100 MG tablet    Sig: Take 1 tablet (100 mg total) by mouth 2 (two) times daily for 7 days.    Dispense:  14 tablet    Refill:  0    Order Specific Question:   Supervising Provider    Answer:   Nadene Rubins ALFRED [[id number]]   ciprofloxacin (CIPRO) 500  MG tablet    Sig: Take 1 tablet (500 mg total) by mouth 2 (two) times daily for 3 days.    Dispense:  6 tablet    Refill:  0    Do not fill doxycycline    Order Specific Question:   Supervising Provider    Answer:   Mliss Sax [[id number]]   Return if symptoms worsen or fail to improve.    Alysia Penna, NP

## 2022-04-15 NOTE — Patient Instructions (Signed)
Maintain adequate oral hydration Start cipro, take with food Go to ED if symptoms worsen  Urinary Tract Infection, Adult A urinary tract infection (UTI) is an infection of any part of the urinary tract. The urinary tract includes: The kidneys. The ureters. The bladder. The urethra. These organs make, store, and get rid of pee (urine) in the body. What are the causes? This infection is caused by germs (bacteria) in your genital area. These germs grow and cause swelling (inflammation) of your urinary tract. What increases the risk? The following factors may make you more likely to develop this condition: Using a small, thin tube (catheter) to drain pee. Not being able to control when you pee or poop (incontinence). Being male. If you are male, these things can increase the risk: Using these methods to prevent pregnancy: A medicine that kills sperm (spermicide). A device that blocks sperm (diaphragm). Having low levels of a male hormone (estrogen). Being pregnant. You are more likely to develop this condition if: You have genes that add to your risk. You are sexually active. You take antibiotic medicines. You have trouble peeing because of: A prostate that is bigger than normal, if you are male. A blockage in the part of your body that drains pee from the bladder. A kidney stone. A nerve condition that affects your bladder. Not getting enough to drink. Not peeing often enough. You have other conditions, such as: Diabetes. A weak disease-fighting system (immune system). Sickle cell disease. Gout. Injury of the spine. What are the signs or symptoms? Symptoms of this condition include: Needing to pee right away. Peeing small amounts often. Pain or burning when peeing. Blood in the pee. Pee that smells bad or not like normal. Trouble peeing. Pee that is cloudy. Fluid coming from the vagina, if you are male. Pain in the belly or lower back. Other symptoms  include: Vomiting. Not feeling hungry. Feeling mixed up (confused). This may be the first symptom in older adults. Being tired and grouchy (irritable). A fever. Watery poop (diarrhea). How is this treated? Taking antibiotic medicine. Taking other medicines. Drinking enough water. In some cases, you may need to see a specialist. Follow these instructions at home:  Medicines Take over-the-counter and prescription medicines only as told by your doctor. If you were prescribed an antibiotic medicine, take it as told by your doctor. Do not stop taking it even if you start to feel better. General instructions Make sure you: Pee until your bladder is empty. Do not hold pee for a long time. Empty your bladder after sex. Wipe from front to back after peeing or pooping if you are a male. Use each tissue one time when you wipe. Drink enough fluid to keep your pee pale yellow. Keep all follow-up visits. Contact a doctor if: You do not get better after 1-2 days. Your symptoms go away and then come back. Get help right away if: You have very bad back pain. You have very bad pain in your lower belly. You have a fever. You have chills. You feeling like you will vomit or you vomit. Summary A urinary tract infection (UTI) is an infection of any part of the urinary tract. This condition is caused by germs in your genital area. There are many risk factors for a UTI. Treatment includes antibiotic medicines. Drink enough fluid to keep your pee pale yellow. This information is not intended to replace advice given to you by your health care provider. Make sure you discuss any questions you  have with your health care provider. Document Revised: 08/02/2019 Document Reviewed: 08/02/2019 [employer] Patient Education  [employer].

## 2022-04-17 LAB — URINE CULTURE
MICRO NUMBER:: 14818203
SPECIMEN QUALITY:: ADEQUATE

## 2022-04-17 MED ORDER — CIPROFLOXACIN HCL 500 MG PO TABS
500.0000 mg | ORAL_TABLET | Freq: Two times a day (BID) | ORAL | 0 refills | Status: AC
Start: 2022-04-17 — End: 2022-04-21

## 2022-04-17 NOTE — Addendum Note (Signed)
Addended by: Michaela Corner on: 04/17/2022 08:33 PM   Modules accepted: Orders
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
## 2022-04-25 ENCOUNTER — Ambulatory Visit: Payer: BC Managed Care – PPO | Attending: Nurse Practitioner | Admitting: Nurse Practitioner

## 2022-04-25 ENCOUNTER — Encounter: Payer: Self-pay | Admitting: Nurse Practitioner

## 2022-04-25 VITALS — BP 98/60 | HR 60 | Ht 66.0 in | Wt 186.0 lb

## 2022-04-25 DIAGNOSIS — R0609 Other forms of dyspnea: Secondary | ICD-10-CM

## 2022-04-25 DIAGNOSIS — E7800 Pure hypercholesterolemia, unspecified: Secondary | ICD-10-CM | POA: Diagnosis not present

## 2022-04-25 DIAGNOSIS — I25119 Atherosclerotic heart disease of native coronary artery with unspecified angina pectoris: Secondary | ICD-10-CM

## 2022-04-25 DIAGNOSIS — I10 Essential (primary) hypertension: Secondary | ICD-10-CM | POA: Diagnosis not present

## 2022-04-25 NOTE — Patient Instructions (Signed)
Medication Instructions:  Your physician recommends that you continue on your current medications as directed. Please refer to the Current Medication list given to you today. *If you need a refill on your cardiac medications before your next appointment, please call your pharmacy*   Lab Work: None Ordered If you have labs (blood work) drawn today and your tests are completely normal, you will receive your results only by: MyChart Message (if you have MyChart) OR A paper copy in the mail If you have any lab test that is abnormal or we need to change your treatment, we will call you to review the results.   Testing/Procedures: None Ordered   Follow-Up: At [facility], you and your health needs are our priority.  As part of our continuing mission to provide you with exceptional heart care, we have created designated Provider Care Teams.  These Care Teams include your primary Cardiologist (physician) and Advanced Practice Providers (APPs -  Physician Assistants and Nurse Practitioners) who all work together to provide you with the care you need, when you need it.  We recommend signing up for the patient portal called "MyChart".  Sign up information is provided on this After Visit Summary.  MyChart is used to connect with patients for Virtual Visits (Telemedicine).  Patients are able to view lab/test results, encounter notes, upcoming appointments, etc.  Non-urgent messages can be sent to your provider as well.   To learn more about what you can do with MyChart, go to [email or web address].    Your next appointment:   4 month(s)  Provider:   Dietrich Pates, MD  or Robin Searing, NP   Other Instructions Check your blood pressure daily for 2 weeks, then contact the office with your readings.

## 2022-06-12 ENCOUNTER — Other Ambulatory Visit: Payer: Self-pay | Admitting: Internal Medicine
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
## 2022-08-29 ENCOUNTER — Encounter: Payer: Self-pay | Admitting: Nurse Practitioner

## 2022-08-29 ENCOUNTER — Ambulatory Visit: Payer: BC Managed Care – PPO | Attending: Nurse Practitioner | Admitting: Nurse Practitioner

## 2022-08-29 VITALS — BP 126/70 | HR 66 | Ht 66.0 in | Wt 179.4 lb

## 2022-08-29 DIAGNOSIS — G4733 Obstructive sleep apnea (adult) (pediatric): Secondary | ICD-10-CM

## 2022-08-29 DIAGNOSIS — I10 Essential (primary) hypertension: Secondary | ICD-10-CM | POA: Diagnosis not present

## 2022-08-29 DIAGNOSIS — I25119 Atherosclerotic heart disease of native coronary artery with unspecified angina pectoris: Secondary | ICD-10-CM | POA: Diagnosis not present

## 2022-08-29 DIAGNOSIS — E7800 Pure hypercholesterolemia, unspecified: Secondary | ICD-10-CM

## 2022-08-29 NOTE — Patient Instructions (Signed)
Medication Instructions:  Your physician recommends that you continue on your current medications as directed. Please refer to the Current Medication list given to you today. *If you need a refill on your cardiac medications before your next appointment, please call your pharmacy*   Lab Work: None Ordered   Testing/Procedures: None ordered   Follow-Up: At [facility], you and your health needs are our priority.  As part of our continuing mission to provide you with exceptional heart care, we have created designated Provider Care Teams.  These Care Teams include your primary Cardiologist (physician) and Advanced Practice Providers (APPs -  Physician Assistants and Nurse Practitioners) who all work together to provide you with the care you need, when you need it.  We recommend signing up for the patient portal called "MyChart".  Sign up information is provided on this After Visit Summary.  MyChart is used to connect with patients for Virtual Visits (Telemedicine).  Patients are able to view lab/test results, encounter notes, upcoming appointments, etc.  Non-urgent messages can be sent to your provider as well.   To learn more about what you can do with MyChart, go to [email or web address].    Your next appointment:   6 month(s)  Provider:   Dietrich Pates, MD  or Robin Searing, NP   Other Instructions

## 2022-09-09 ENCOUNTER — Other Ambulatory Visit: Payer: Self-pay | Admitting: Interventional Cardiology

## 2022-09-09 ENCOUNTER — Other Ambulatory Visit: Payer: Self-pay | Admitting: Internal Medicine

## 2022-09-09 DIAGNOSIS — I10 Essential (primary) hypertension: Secondary | ICD-10-CM

## 2022-09-26 ENCOUNTER — Other Ambulatory Visit: Payer: Self-pay | Admitting: Internal Medicine

## 2022-10-31 ENCOUNTER — Other Ambulatory Visit: Payer: Self-pay | Admitting: Internal Medicine

## 2022-11-02 ENCOUNTER — Encounter: Payer: Self-pay | Admitting: Internal Medicine

## 2022-11-03 ENCOUNTER — Encounter: Payer: Self-pay | Admitting: Gastroenterology

## 2022-11-08 ENCOUNTER — Encounter: Payer: Self-pay | Admitting: Gastroenterology

## 2022-11-09 NOTE — Telephone Encounter (Signed)
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   Would have to discuss with primary physician

## 2022-12-07 ENCOUNTER — Other Ambulatory Visit: Payer: Self-pay | Admitting: Internal Medicine

## 2022-12-07 DIAGNOSIS — I10 Essential (primary) hypertension: Secondary | ICD-10-CM

## 2022-12-08 ENCOUNTER — Other Ambulatory Visit: Payer: Self-pay | Admitting: Internal Medicine

## 2023-01-05 ENCOUNTER — Encounter: Payer: Self-pay | Admitting: Internal Medicine

## 2023-01-09 NOTE — Telephone Encounter (Signed)
 I cannot write letter for work.   Take general precautions

## 2023-01-11 NOTE — Telephone Encounter (Signed)
 I would hold simvistatin if takes  paxlovid  REsume after

## 2023-01-12 ENCOUNTER — Ambulatory Visit: Payer: Medicare Other | Admitting: Internal Medicine

## 2023-01-17 ENCOUNTER — Ambulatory Visit (INDEPENDENT_AMBULATORY_CARE_PROVIDER_SITE_OTHER): Payer: 59 | Admitting: Internal Medicine

## 2023-01-17 VITALS — BP 110/70 | HR 64 | Temp 98.0°F | Wt 181.1 lb

## 2023-01-17 DIAGNOSIS — G8929 Other chronic pain: Secondary | ICD-10-CM

## 2023-01-17 DIAGNOSIS — M25512 Pain in left shoulder: Secondary | ICD-10-CM | POA: Diagnosis not present

## 2023-01-17 DIAGNOSIS — Z23 Encounter for immunization: Secondary | ICD-10-CM
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
## 2023-01-30 ENCOUNTER — Encounter: Payer: Self-pay | Admitting: Physician Assistant

## 2023-01-30 ENCOUNTER — Ambulatory Visit (INDEPENDENT_AMBULATORY_CARE_PROVIDER_SITE_OTHER): Payer: 59 | Admitting: Physician Assistant

## 2023-01-30 ENCOUNTER — Other Ambulatory Visit: Payer: Self-pay | Admitting: Radiology

## 2023-01-30 ENCOUNTER — Other Ambulatory Visit (INDEPENDENT_AMBULATORY_CARE_PROVIDER_SITE_OTHER): Payer: Medicare Other

## 2023-01-30 DIAGNOSIS — M25512 Pain in left shoulder: Secondary | ICD-10-CM

## 2023-01-30 DIAGNOSIS — G8929 Other chronic pain: Secondary | ICD-10-CM

## 2023-01-30 NOTE — Addendum Note (Signed)
Addended by: Mardene Celeste B on: 01/30/2023 01:55 PM   Modules accepted: Orders
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
## 2023-02-02 ENCOUNTER — Encounter: Payer: Self-pay | Admitting: Gastroenterology

## 2023-02-09 ENCOUNTER — Encounter: Payer: Self-pay | Admitting: Gastroenterology

## 2023-02-09 ENCOUNTER — Ambulatory Visit (INDEPENDENT_AMBULATORY_CARE_PROVIDER_SITE_OTHER): Payer: Medicare Other | Admitting: Gastroenterology

## 2023-02-09 ENCOUNTER — Telehealth: Payer: Self-pay | Admitting: *Deleted

## 2023-02-09 VITALS — BP 122/72 | HR 61 | Ht 66.0 in | Wt 181.2 lb

## 2023-02-09 DIAGNOSIS — Z860100 Personal history of colon polyps, unspecified: Secondary | ICD-10-CM

## 2023-02-09 DIAGNOSIS — Z01818 Encounter for other preprocedural examination: Secondary | ICD-10-CM

## 2023-02-09 DIAGNOSIS — Z7902 Long term (current) use of antithrombotics/antiplatelets: Secondary | ICD-10-CM | POA: Diagnosis not present

## 2023-02-09 MED ORDER — NA SULFATE-K SULFATE-MG SULF 17.5-3.13-1.6 GM/177ML PO SOLN: 1.0000 | Freq: Once | ORAL | 0 refills | Status: AC

## 2023-02-09 NOTE — Patient Instructions (Signed)
 You will be contacted by our office prior to your procedure for directions on holding your Plavix .  If you do not hear from our office 1 week prior to your scheduled procedure, please call [phone] to discuss.    You have been scheduled for a colonoscopy. Please follow written instructions given to you at your visit today.   If you use inhalers (even only as needed), please bring them with you on the day of your procedure.  DO NOT TAKE 7 DAYS PRIOR TO TEST- Trulicity (dulaglutide) Ozempic, Wegovy (semaglutide) Mounjaro (tirzepatide) Bydureon Bcise (exanatide extended release)  DO NOT TAKE 1 DAY PRIOR TO YOUR TEST Rybelsus (semaglutide) Adlyxin (lixisenatide) Victoza (liraglutide) Byetta (exanatide) ___________________________________________________________________________  If your blood pressure at your visit was 140/90 or greater, please contact your primary care physician to follow up on this.  _______________________________________________________  If you are age 52 or older, your body mass index should be between 23-30. Your Body mass index is 29.25 kg/m. If this is out of the aforementioned range listed, please consider follow up with your Primary Care Provider.  If you are age 72 or younger, your body mass index should be between 19-25. Your Body mass index is 29.25 kg/m. If this is out of the aformentioned range listed, please consider follow up with your Primary Care Provider.   ________________________________________________________  The [facility] providers would like to encourage you to use MYCHART to communicate with providers for non-urgent requests or questions.  Due to long hold times on the telephone, sending your provider a message by [facility] may be a faster and more efficient way to get a response.  Please allow 48 business hours for a response.  Please remember that this is for non-urgent requests.  _______________________________________________________
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
## 2023-02-09 NOTE — Telephone Encounter (Signed)
 [facility] Pre-operative Risk Assessment     Request for surgical clearance:     Endoscopy Procedure  What type of surgery is being performed?     Colonoscopy  When is this surgery scheduled?     02/28/23  What type of clearance is required ?   Pharmacy  Are there any medications that need to be held prior to surgery and how long? Plavix  5 days  Practice name and name of physician performing surgery?      [facility]  What is your office phone and fax number?      Phone- [phone]  Fax- [phone]  Anesthesia type (None, local, MAC, general) ?       MAC   Please route your response to Powell Misty, CMA

## 2023-02-10 NOTE — Telephone Encounter (Signed)
 Left message to call back to schedule tele pre op appt.

## 2023-02-10 NOTE — Telephone Encounter (Signed)
   Name: Manuel Barker  DOB: 04-19-1950  MRN: [id number]  Primary Cardiologist: [name], MD   Preoperative team, please contact this patient and set up a phone call appointment for further preoperative risk assessment. Please obtain consent and complete medication review. Thank you for your help.  I confirm that guidance regarding antiplatelet and oral anticoagulation therapy has been completed and, if necessary, noted below.  His Plavix  may be held for 5 days prior to his procedure.  Please resume as soon as hemostasis is achieved.  His aspirin  will need to be continued throughout the perioperative period.  I also confirmed the patient resides in the state of [place] . As per [facility] Medical Board telemedicine laws, the patient must reside in the state in which the provider is licensed.   [name], NP 02/10/2023, 11:06 AM [facility]

## 2023-02-12 ENCOUNTER — Other Ambulatory Visit: Payer: Self-pay | Admitting: Internal Medicine

## 2023-02-13 ENCOUNTER — Telehealth: Payer: Self-pay | Admitting: *Deleted

## 2023-02-13 NOTE — Telephone Encounter (Signed)
 Pt has been scheduled tele preop appt 02/23/23. Med rec and consent are done.

## 2023-02-13 NOTE — Telephone Encounter (Signed)
 Pt has been scheduled tele preop appt 02/23/23. Med rec and consent are done.     Patient Consent for Virtual Visit        Manuel Barker has provided verbal consent on 02/13/2023 for a virtual visit (video or telephone).   CONSENT FOR VIRTUAL VISIT FOR:  Manuel Barker  By participating in this virtual visit I agree to the following:  I hereby voluntarily request, consent and authorize [facility] and its employed or contracted physicians, physician assistants, nurse practitioners or other licensed health care professionals (the Practitioner), to provide me with telemedicine health care services (the "Services") as deemed necessary by the treating Practitioner. I acknowledge and consent to receive the Services by the Practitioner via telemedicine. I understand that the telemedicine visit will involve communicating with the Practitioner through live audiovisual communication technology and the disclosure of certain medical information by electronic transmission. I acknowledge that I have been given the opportunity to request an in-person assessment or other available alternative prior to the telemedicine visit and am voluntarily participating in the telemedicine visit.  I understand that I have the right to withhold or withdraw my consent to the use of telemedicine in the course of my care at any time, without affecting my right to future care or treatment, and that the Practitioner or I may terminate the telemedicine visit at any time. I understand that I have the right to inspect all information obtained and/or recorded in the course of the telemedicine visit and may receive copies of available information for a reasonable fee.  I understand that some of the potential risks of receiving the Services via telemedicine include:  Delay or interruption in medical evaluation due to technological equipment failure or disruption; Information transmitted may not be sufficient (e.g. poor resolution  of images) to allow for appropriate medical decision making by the Practitioner; and/or  In rare instances, security protocols could fail, causing a breach of personal health information.  Furthermore, I acknowledge that it is my responsibility to provide information about my medical history, conditions and care that is complete and accurate to the best of my ability. I acknowledge that Practitioner's advice, recommendations, and/or decision may be based on factors not within their control, such as incomplete or inaccurate data provided by me or distortions of diagnostic images or specimens that may result from electronic transmissions. I understand that the practice of medicine is not an exact science and that Practitioner makes no warranties or guarantees regarding treatment outcomes. I acknowledge that a copy of this consent can be made available to me via my patient portal [facility] MyChart), or I can request a printed copy by calling the office of [facility].    I understand that my insurance will be billed for this visit.   I have read or had this consent read to me. I understand the contents of this consent, which adequately explains the benefits and risks of the Services being provided via telemedicine.  I have been provided ample opportunity to ask questions regarding this consent and the Services and have had my questions answered to my satisfaction. I give my informed consent for the services to be provided through the use of telemedicine in my medical care

## 2023-02-16 ENCOUNTER — Other Ambulatory Visit: Payer: Self-pay | Admitting: Internal Medicine

## 2023-02-21 ENCOUNTER — Ambulatory Visit (INDEPENDENT_AMBULATORY_CARE_PROVIDER_SITE_OTHER): Payer: 59 | Admitting: Physical Therapy

## 2023-02-21 ENCOUNTER — Other Ambulatory Visit: Payer: Self-pay

## 2023-02-21 DIAGNOSIS — R278 Other lack of coordination: Secondary | ICD-10-CM | POA: Diagnosis not present

## 2023-02-21 DIAGNOSIS — M6281 Muscle weakness (generalized): Secondary | ICD-10-CM | POA: Diagnosis not present

## 2023-02-21 DIAGNOSIS — M25512 Pain in left shoulder: Secondary | ICD-10-CM

## 2023-02-21 DIAGNOSIS — R293 Abnormal posture: Secondary | ICD-10-CM | POA: Diagnosis not present
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
## 2023-02-23 ENCOUNTER — Ambulatory Visit: Payer: Medicare Other | Attending: Internal Medicine

## 2023-02-23 DIAGNOSIS — Z01810 Encounter for preprocedural cardiovascular examination: Secondary | ICD-10-CM | POA: Diagnosis not present

## 2023-02-23 DIAGNOSIS — Z01818 Encounter for other preprocedural examination: Secondary | ICD-10-CM

## 2023-02-24 NOTE — Addendum Note (Signed)
Addended by: Jodelle Gross on: 02/24/2023 10:16 AM   Modules accepted: Level of Service

## 2023-02-26 ENCOUNTER — Encounter: Payer: Self-pay | Admitting: Certified Registered Nurse Anesthetist

## 2023-02-27 ENCOUNTER — Ambulatory Visit (INDEPENDENT_AMBULATORY_CARE_PROVIDER_SITE_OTHER): Payer: Medicare Other | Admitting: Physician Assistant

## 2023-02-27 ENCOUNTER — Encounter: Payer: Self-pay | Admitting: Physician Assistant

## 2023-02-27 DIAGNOSIS — M7542 Impingement syndrome of left shoulder: Secondary | ICD-10-CM

## 2023-02-27 NOTE — Progress Notes (Signed)
 Mr. Laabs returns today follow-up of his left shoulder.  He has had 1 therapy appointment.  Has not really done any home exercises.  States his pain is unchanged still 4 out of 10.  Denies any radicular symptoms.  Review of systems: See HPI otherwise negative or noncontributory.  Physical exam: General Well-developed well-nourished male no acute distress. Bilateral shoulders: 5 out of 5 strength with external and internal rotation against resistance empty can test is negative bilaterally.  Liftoff test negative on the left.  Mildly positive impingement on the left negative on the right.  Nontender over the medial border of the scapula bilaterally.  Impression: Left shoulder impingement  Plan: Discussed with him a cortisone injection he defers.  He like to go to therapy.  Therefore recommend 6 weeks of physical therapy to include a home exercise program.  Follow-up with [place] in 6 weeks if the pain continues at that point time we will recommend a subacromial injection.  Questions were encouraged and answered.

## 2023-02-27 NOTE — Telephone Encounter (Signed)
 Patient cleared to hold Plavix via telehealth on 02/23/23

## 2023-02-28 ENCOUNTER — Ambulatory Visit (AMBULATORY_SURGERY_CENTER): Payer: Medicare Other | Admitting: Gastroenterology

## 2023-02-28 ENCOUNTER — Encounter: Payer: Self-pay | Admitting: Gastroenterology

## 2023-02-28 VITALS — BP 101/71 | HR 57 | Temp 97.9°F | Resp 16 | Ht 66.0 in | Wt 181.0 lb

## 2023-02-28 DIAGNOSIS — K640 First degree hemorrhoids: Secondary | ICD-10-CM

## 2023-02-28 DIAGNOSIS — K5520 Angiodysplasia of colon without hemorrhage: Secondary | ICD-10-CM | POA: Diagnosis not present

## 2023-02-28 DIAGNOSIS — K5730 Diverticulosis of large intestine without perforation or abscess without bleeding: Secondary | ICD-10-CM

## 2023-02-28 DIAGNOSIS — Z7902 Long term (current) use of antithrombotics/antiplatelets: Secondary | ICD-10-CM

## 2023-02-28 DIAGNOSIS — Z1211 Encounter for screening for malignant neoplasm of colon: Secondary | ICD-10-CM | POA: Diagnosis not present

## 2023-02-28 DIAGNOSIS — Z860100 Personal history of colon polyps, unspecified: Secondary | ICD-10-CM

## 2023-02-28 MED ORDER — SODIUM CHLORIDE 0.9 % IV SOLN: 500.0000 mL | Freq: Once | INTRAVENOUS | Status: DC

## 2023-02-28 NOTE — Op Note (Addendum)
 [facility] Patient Name: Manuel Barker Procedure Date: 02/28/2023 7:10 AM MRN: [id number] Endoscopist: Lynann Bologna , MD, [id number] Age: 73 Referring MD:  Date of Birth: [birth year] Gender: Male Account #: [id number] Procedure:                Colonoscopy Indications:              High risk colon cancer surveillance: Personal                            history of colonic polyps Medicines:                Monitored Anesthesia Care Procedure:                Pre-Anesthesia Assessment:                           - Prior to the procedure, a History and Physical                            was performed, and patient medications and                            allergies were reviewed. The patient's tolerance of                            previous anesthesia was also reviewed. The risks                            and benefits of the procedure and the sedation                            options and risks were discussed with the patient.                            All questions were answered, and informed consent                            was obtained. Prior Anticoagulants: The patient has                            taken Plavix (clopidogrel), last dose was 5 days                            prior to procedure. ASA Grade Assessment: III - A                            patient with severe systemic disease. After                            reviewing the risks and benefits, the patient was                            deemed in satisfactory condition to undergo the  procedure.                           After obtaining informed consent, the colonoscope                            was passed under direct vision. Throughout the                            procedure, the patient's blood pressure, pulse, and                            oxygen saturations were monitored continuously. The                            Olympus Scope SN: [id number] was introduced through                             the anus and advanced to the the cecum, identified                            by appendiceal orifice and ileocecal valve. The                            colonoscopy was performed without difficulty. The                            patient tolerated the procedure well. The quality                            of the bowel preparation was good. The ileocecal                            valve, appendiceal orifice, and rectum were                            photographed. Scope In: 8:07:55 AM Scope Out: 8:20:29 AM Scope Withdrawal Time: 0 hours 8 minutes 2 seconds  Total Procedure Duration: 0 hours 12 minutes 34 seconds  Findings:                 A few small-mouthed diverticula were found in the                            sigmoid colon.                           A few small localized angioectasias without                            bleeding were found in the distal ant rectum.                           Non-bleeding internal hemorrhoids were found during  retroflexion. The hemorrhoids were small and Grade                            I (internal hemorrhoids that do not prolapse).                           The exam was otherwise without abnormality on                            direct and retroflexion views. Complications:            No immediate complications. Estimated Blood Loss:     Estimated blood loss: none. Impression:               - Mild sigmoid diverticulosis.                           - Mild radiation proctitis.                           - Non-bleeding internal hemorrhoids.                           - The examination was otherwise normal on direct                            and retroflexion views.                           - No specimens collected. Recommendation:           - Patient has a contact number available for                            emergencies. The signs and symptoms of potential                            delayed complications were  discussed with the                            patient. Return to normal activities tomorrow.                            Written discharge instructions were provided to the                            patient.                           - Resume previous diet.                           - Continue present medications. Resume Plavix from                            tomorrow onwards.                           -  No need to rpt d/t age unless problems.                           - The findings and recommendations were discussed                            with the patient's family. Lynann Bologna, MD 02/28/2023 8:26:21 AM This report has been signed electronically.

## 2023-02-28 NOTE — Progress Notes (Signed)
 Pt's states no medical or surgical changes since previsit or office visit.
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
## 2023-02-28 NOTE — Progress Notes (Signed)
0820 Ephedrine 10 mg given IV due to low BP, MD updated.   

## 2023-02-28 NOTE — Progress Notes (Signed)
 Report given to PACU, vss

## 2023-02-28 NOTE — Patient Instructions (Signed)
 Discharge instructions given. Handouts on Diverticulosis and Hemorrhoids. Resume previous medications. Resume Plavix tomorrow. YOU HAD AN ENDOSCOPIC PROCEDURE TODAY AT THE [facility]:   Refer to the procedure report that was given to you for any specific questions about what was found during the examination.  If the procedure report does not answer your questions, please call your gastroenterologist to clarify.  If you requested that your care partner not be given the details of your procedure findings, then the procedure report has been included in a sealed envelope for you to review at your convenience later.  YOU SHOULD EXPECT: Some feelings of bloating in the abdomen. Passage of more gas than usual.  Walking can help get rid of the air that was put into your GI tract during the procedure and reduce the bloating. If you had a lower endoscopy (such as a colonoscopy or flexible sigmoidoscopy) you may notice spotting of blood in your stool or on the toilet paper. If you underwent a bowel prep for your procedure, you may not have a normal bowel movement for a few days.  Please Note:  You might notice some irritation and congestion in your nose or some drainage.  This is from the oxygen used during your procedure.  There is no need for concern and it should clear up in a day or so.  SYMPTOMS TO REPORT IMMEDIATELY:  Following lower endoscopy (colonoscopy or flexible sigmoidoscopy):  Excessive amounts of blood in the stool  Significant tenderness or worsening of abdominal pains  Swelling of the abdomen that is new, acute  Fever of 100F or higher   For urgent or emergent issues, a gastroenterologist can be reached at any hour by calling [phone]. Do not use MyChart messaging for urgent concerns.    DIET:  We do recommend a small meal at first, but then you may proceed to your regular diet.  Drink plenty of fluids but you should avoid alcoholic beverages for 24  hours.  ACTIVITY:  You should plan to take it easy for the rest of today and you should NOT DRIVE or use heavy machinery until tomorrow (because of the sedation medicines used during the test).    FOLLOW UP: Our staff will call the number listed on your records the next business day following your procedure.  We will call around 7:15- 8:00 am to check on you and address any questions or concerns that you may have regarding the information given to you following your procedure. If we do not reach you, we will leave a message.     If any biopsies were taken you will be contacted by phone or by letter within the next 1-3 weeks.  Please call us at [phone] if you have not heard about the biopsies in 3 weeks.    SIGNATURES/CONFIDENTIALITY: You and/or your care partner have signed paperwork which will be entered into your electronic medical record.  These signatures attest to the fact that that the information above on your After Visit Summary has been reviewed and is understood.  Full responsibility of the confidentiality of this discharge information lies with you and/or your care-partner.

## 2023-03-01 ENCOUNTER — Telehealth: Payer: Self-pay | Admitting: *Deleted

## 2023-03-01 NOTE — Telephone Encounter (Signed)
  Follow up Call-     02/28/2023    7:32 AM  Call back number  Post procedure Call Back phone  # [phone]  Permission to leave phone message Yes     Patient questions:  Do you have a fever, pain , or abdominal swelling? No. Pain Score  0 *  Have you tolerated food without any problems? Yes.    Have you been able to return to your normal activities? Yes.    Do you have any questions about your discharge instructions: Diet   No. Medications  No. Follow up visit  No.  Do you have questions or concerns about your Care? No.  Actions: * If pain score is 4 or above: No action needed, pain <4.

## 2023-03-06 ENCOUNTER — Encounter: Payer: Self-pay | Admitting: Internal Medicine

## 2023-03-07 ENCOUNTER — Ambulatory Visit (INDEPENDENT_AMBULATORY_CARE_PROVIDER_SITE_OTHER): Payer: 59 | Admitting: Physical Therapy

## 2023-03-07 ENCOUNTER — Encounter: Payer: Self-pay | Admitting: Physical Therapy

## 2023-03-07 DIAGNOSIS — M25512 Pain in left shoulder: Secondary | ICD-10-CM

## 2023-03-07 DIAGNOSIS — M6281 Muscle weakness (generalized): Secondary | ICD-10-CM

## 2023-03-07 DIAGNOSIS — R293 Abnormal posture: Secondary | ICD-10-CM | POA: Diagnosis not present
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
## 2023-03-09 ENCOUNTER — Encounter: Payer: 59 | Admitting: Physical Therapy

## 2023-03-10 ENCOUNTER — Other Ambulatory Visit: Payer: Self-pay | Admitting: Internal Medicine

## 2023-03-10 DIAGNOSIS — I10 Essential (primary) hypertension: Secondary | ICD-10-CM

## 2023-03-14 ENCOUNTER — Encounter: Payer: Self-pay | Admitting: Physical Therapy

## 2023-03-14 ENCOUNTER — Ambulatory Visit (INDEPENDENT_AMBULATORY_CARE_PROVIDER_SITE_OTHER): Payer: 59 | Admitting: Physical Therapy

## 2023-03-14 DIAGNOSIS — R293 Abnormal posture: Secondary | ICD-10-CM

## 2023-03-14 DIAGNOSIS — M25512 Pain in left shoulder: Secondary | ICD-10-CM

## 2023-03-14 DIAGNOSIS — M6281 Muscle weakness (generalized): Secondary | ICD-10-CM | POA: Diagnosis not present
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
## 2023-03-16 ENCOUNTER — Telehealth: Payer: Self-pay | Admitting: Physical Therapy

## 2023-03-16 ENCOUNTER — Encounter: Payer: 59 | Admitting: Physical Therapy

## 2023-03-16 NOTE — Telephone Encounter (Signed)
 Pt did not show for PT appointment today. They were contacted and informed of this by phone and he states he forgot about this appointment. They were provided the date and time of their next appointment.   Ivery Quale, PT, DPT 03/16/23 9:53 AM

## 2023-03-21 ENCOUNTER — Encounter: Payer: Self-pay | Admitting: Physical Therapy

## 2023-03-21 ENCOUNTER — Ambulatory Visit (INDEPENDENT_AMBULATORY_CARE_PROVIDER_SITE_OTHER): Payer: 59 | Admitting: Physical Therapy

## 2023-03-21 DIAGNOSIS — R293 Abnormal posture: Secondary | ICD-10-CM | POA: Diagnosis not present

## 2023-03-21 DIAGNOSIS — M6281 Muscle weakness (generalized): Secondary | ICD-10-CM

## 2023-03-21 DIAGNOSIS — M25512 Pain in left shoulder: Secondary | ICD-10-CM | POA: Diagnosis not present
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
## 2023-03-23 ENCOUNTER — Ambulatory Visit: Payer: 59 | Admitting: Physical Therapy

## 2023-03-23 ENCOUNTER — Encounter: Payer: Self-pay | Admitting: Physical Therapy

## 2023-03-23 DIAGNOSIS — R278 Other lack of coordination: Secondary | ICD-10-CM | POA: Diagnosis not present

## 2023-03-23 DIAGNOSIS — M6281 Muscle weakness (generalized): Secondary | ICD-10-CM

## 2023-03-23 DIAGNOSIS — R293 Abnormal posture: Secondary | ICD-10-CM

## 2023-03-23 DIAGNOSIS — M25512 Pain in left shoulder: Secondary | ICD-10-CM

## 2023-03-23 DIAGNOSIS — M25571 Pain in right ankle and joints of right foot: Secondary | ICD-10-CM
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
## 2023-04-07 ENCOUNTER — Other Ambulatory Visit: Payer: Self-pay | Admitting: Internal Medicine

## 2023-04-25 ENCOUNTER — Telehealth: Payer: Self-pay | Admitting: Internal Medicine

## 2023-04-25 NOTE — Telephone Encounter (Signed)
 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  Miriam American is aware.

## 2023-04-25 NOTE — Telephone Encounter (Signed)
 Copied from CRM [id number]. Topic: Clinical - Medical Advice >> Apr 25, 2023 10:23 AM Jethro Morrison wrote: Reason for CRM: Miriam American WITH [employer] IS CALLING ABOUT THE ADA FORMS THAT WERE COMPLETED FOR PATIENT. HE IS NEEDING MORE INFORMATION AND REQUESTING A CALL BACK. HIS CONTACT NUMBER IS [phone]. HE WANTS TO KNOW WHAT IS THE NUMBER OF PEOPLE IN TERMS OF SMALL GROUP AND IN TERMS OF LARGE GROUPS HOW MANY PEOPLE. HE STATED IT WAS VERY VAGUE WHAT WAS ON THE FORM.JUST NEEDS SOME CLARITY.

## 2023-06-09 ENCOUNTER — Other Ambulatory Visit: Payer: Self-pay | Admitting: Internal Medicine

## 2023-06-09 DIAGNOSIS — I10 Essential (primary) hypertension: Secondary | ICD-10-CM

## 2023-06-14 ENCOUNTER — Other Ambulatory Visit: Payer: Self-pay | Admitting: Internal Medicine

## 2023-06-14 DIAGNOSIS — I10 Essential (primary) hypertension: Secondary | ICD-10-CM

## 2023-06-15 ENCOUNTER — Other Ambulatory Visit: Payer: Self-pay | Admitting: Internal Medicine

## 2023-06-20 ENCOUNTER — Telehealth: Payer: Self-pay

## 2023-06-20 NOTE — Telephone Encounter (Signed)
 Copied from CRM [id number]. Topic: Clinical - Order For Equipment >> Jun 20, 2023  9:29 AM Crist Dominion wrote: Reason for CRM: Patient is requesting Dr. Waylan Haggard to please order him a new CPAP machine as his current one is no longer working as it is very old. Patient is requesting Dr. Darcy Eaton nurse to call him if there is anything he needs to do to get this completed.  Please advise and schedule patient appt with NP for new cpap machine.

## 2023-06-20 NOTE — Telephone Encounter (Signed)
 Patient is returning call he received . I did relay the message that I see about a appointment on June 30th . Patient said ok. Hope that was all needed to be said . If not please give patient a call back

## 2023-06-20 NOTE — Telephone Encounter (Signed)
 Patient is scheduled with Sueanne Emerald on 6/30.

## 2023-06-26 ENCOUNTER — Ambulatory Visit (INDEPENDENT_AMBULATORY_CARE_PROVIDER_SITE_OTHER): Admitting: Internal Medicine

## 2023-06-26 VITALS — BP 102/62 | HR 65 | Temp 98.8°F | Ht 66.0 in | Wt 177.3 lb

## 2023-06-26 DIAGNOSIS — R519 Headache, unspecified: Secondary | ICD-10-CM

## 2023-06-26 DIAGNOSIS — H8309 Labyrinthitis, unspecified ear: Secondary | ICD-10-CM | POA: Diagnosis not present

## 2023-06-26 MED ORDER — PREDNISONE 20 MG PO TABS: 20.0000 mg | ORAL_TABLET | Freq: Every day | ORAL | 0 refills | Status: AC
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
## 2023-07-03 ENCOUNTER — Ambulatory Visit (INDEPENDENT_AMBULATORY_CARE_PROVIDER_SITE_OTHER): Admitting: Primary Care

## 2023-07-03 ENCOUNTER — Encounter: Payer: Self-pay | Admitting: Primary Care

## 2023-07-03 VITALS — BP 132/64 | HR 68 | Temp 97.3°F | Ht 66.0 in | Wt 179.6 lb

## 2023-07-03 DIAGNOSIS — G4733 Obstructive sleep apnea (adult) (pediatric): Secondary | ICD-10-CM | POA: Diagnosis not present

## 2023-07-03 DIAGNOSIS — R911 Solitary pulmonary nodule: Secondary | ICD-10-CM

## 2023-07-03 NOTE — Patient Instructions (Addendum)
 -  OBSTRUCTIVE SLEEP APNEA: Obstructive sleep apnea is a condition where your breathing stops and starts repeatedly during sleep. Your current CPAP machine is nearing the end of its life, and your pressure settings may need adjustment due to your recent weight loss. We will order an in-lab split night sleep study to reassess your sleep apnea and adjust your CPAP pressure settings if needed. Continue using your current CPAP machine nightly until we have the new study results. If indicated, we will order a new CPAP machine after the sleep study. Continue to wear CPAP nightly until sleep study.  -LUNG NODULE: A lung nodule is a small growth in the lung that is usually benign. Your right lower lobe lung nodule, first identified in August 2021, showed no malignant cells. The most recent CT scan in March 2023 showed a subsolid nodule measuring 2.1 x 1.3 cm, with no new nodules. We will order a routine annual follow-up CT scan to monitor the lung nodule   INSTRUCTIONS: Please continue using your current CPAP machine nightly until we have the results from your new sleep study. We will schedule an in-lab split night sleep study to reassess your sleep apnea and adjust your CPAP pressure settings if needed.   Orders: CT chest wo contrast Split night sleep study  Follow-up 3 months with Landry NP
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
## 2023-07-04 ENCOUNTER — Encounter: Payer: Self-pay | Admitting: Primary Care

## 2023-07-04 LAB — PSA: PSA: 0.44

## 2023-07-04 LAB — TESTOSTERONE: Testosterone: 187.3

## 2023-07-06 ENCOUNTER — Other Ambulatory Visit: Payer: Self-pay | Admitting: Internal Medicine

## 2023-07-11 ENCOUNTER — Other Ambulatory Visit

## 2023-07-12 ENCOUNTER — Encounter: Payer: Self-pay | Admitting: Internal Medicine

## 2023-07-12 ENCOUNTER — Ambulatory Visit
Admission: RE | Admit: 2023-07-12 | Discharge: 2023-07-12 | Disposition: A | Discharge status: Home / Self Care | Source: Ambulatory Visit | Attending: Primary Care | Admitting: Primary Care

## 2023-07-12 DIAGNOSIS — R911 Solitary pulmonary nodule: Secondary | ICD-10-CM

## 2023-07-19 ENCOUNTER — Ambulatory Visit: Payer: Self-pay | Admitting: Primary Care

## 2023-07-19 DIAGNOSIS — G4733 Obstructive sleep apnea (adult) (pediatric): Secondary | ICD-10-CM

## 2023-07-19 NOTE — Progress Notes (Signed)
 Please let patient know CT chest showed stable nodular densities when compare with the prior exam of 2023 suggest a long-term benign etiology. Follow-up clinically.

## 2023-07-20 NOTE — Progress Notes (Signed)
 I called and spoke to pt. Pt informed of Beth's note and verbalized understanding. NFN

## 2023-07-21 ENCOUNTER — Encounter: Payer: Self-pay | Admitting: Internal Medicine

## 2023-08-23 ENCOUNTER — Ambulatory Visit (HOSPITAL_BASED_OUTPATIENT_CLINIC_OR_DEPARTMENT_OTHER): Attending: Primary Care | Admitting: Pulmonary Disease

## 2023-08-23 DIAGNOSIS — G4733 Obstructive sleep apnea (adult) (pediatric): Secondary | ICD-10-CM | POA: Insufficient documentation

## 2023-08-27 NOTE — Procedures (Signed)
 [facility] [address] [place], [place] [address] Tel: [phone]   Fax: [phone]  Split Night Interpretation  Patient Name:  Manuel Barker, Manuel Barker Date:  08/23/2023 Referring Physician:  Almarie Ferrari, NP %%startinterp%% Indications for Polysomnography The patient is a 73 year old Male who is 5'7'' and weighs 175.0 lbs.  His BMI equals 34.4.  A diagnostic polysomnogram was performed to evaluate for -.  After 134.0 minutes of sleep time the patient exhibited sufficient respiratory events qualifying him for a CPAP trial which was then initiated.    Polysomnogram Data A full night polysomnogram was performed recording the standard physiologic parameters including EEG, EOG, EMG, EKG, nasal and oral airflow.  Respiratory parameters of chest and abdominal movements are recorded with Peizo-Crystal motion transducers.  Oxygen saturation was recorded by pulse oximetry.    Sleep Architecture The total recording time of the diagnostic portion of the study was 193.2 minutes.  The total sleep time was 134.0 minutes.  During the diagnostic portion of the study, the patient spent 6.0% of total sleep time in Stage N1, 70.1% in Stage N2, 0.0% in Stages N3, and 23.9% in REM.   Sleep latency was 9.2 minutes.  REM latency was 96.5 minutes.  Sleep Efficiency was 69.4%.  Wake after Sleep Onset time was 50.0 minutes.   At 01:15:42 AM the patient was placed on PAP treatment and was titrated at pressures ranging from 5* cm/H20 with supplemental oxygen at - up to 9* cm/H20 with supplemental oxygen at -.  The total recording time of the treatment portion of the study was 292.3 minutes.  The total sleep time was 250.5 minutes.  During the treatment portion of the study, the patient spent 3.8% of total sleep time in Stage N1, 70.9% in Stage N2, 0.0% in Stages N3, and 25.3% in REM.   Sleep latency was 9.0 minutes.  REM latency was 45.5 minutes.  Sleep Efficiency was 85.7%.  Wake  after Sleep Onset time was 32.5 minutes.  Respiratory Events During the diagnostic portion of the study, the polysomnogram revealed a presence of 22 obstructive, - central, and 1 mixed apneas resulting in an Apnea index of 10.3 events per hour.  There were 85 hypopneas (>=3% desaturation and/or arousal) resulting in an Apnea\Hypopnea Index (AHI >=3% desaturation and/or arousal) of 48.4 events per hour.  There were 67 hypopneas (>=4% desaturation) resulting in an Apnea\Hypopnea Index (AHI >=4% desaturation) of 40.3 events per hour.  There were 2 Respiratory Effort Related Arousals resulting in a RERA index of 0.9 events per hour. The Respiratory Disturbance Index is 49.3 events per hour.  The snore index was 147.8 events per hour.  Mean oxygen saturation was 93.0%.  The lowest oxygen saturation during sleep was 81.0%.  Time spent <=88% oxygen saturation was 8.0 minutes (4.6%).  During the treatment portion of the study, the polysomnogram revealed a presence of - obstructive, - central, and - mixed apneas resulting in an Apnea index of - events per hour.  There were 4 hypopneas (>=3% desaturation and/or arousal) resulting in an Apnea\Hypopnea Index (AHI >=3% desaturation and/or arousal) of 1.0 events per hour.  There were - hypopneas (>=4% desaturation) resulting in an Apnea\Hypopnea Index (AHI >=4% desaturation) of - events per hour.  There were - Respiratory Effort Related Arousals resulting in a RERA index of - events per hour. The Respiratory Disturbance Index is 1.0 events per hour.  The snore index was 14.1 events per hour.  Mean oxygen  saturation was 95.1%.  The lowest oxygen saturation during sleep was 93.0%.  Time spent <=88% oxygen saturation was - minutes (-).  Limb Activity During the diagnostic portion of the study, there were - limb movements recorded.  Of this total, - were classified as PLMs.  Of the PLMs, - were associated with arousals.  The Limb Movement index was - per hour while the PLM  index was - per hour.  During the treatment portion of the study, there were 79 limb movements recorded.  Of this total, 60 were classified as PLMs.  Of the PLMs, 3 were associated with arousals.  The Limb Movement index was 18.9 per hour while the PLM index was 14.4 per hour.  Cardiac Summary During the diagnostic portion of the study, the average pulse rate was 58.4 bpm.  The minimum pulse rate was 49.0 bpm while the maximum pulse rate was 88.0 bpm.  During the treatment portion of the study, the average pulse rate was 54.8 bpm.  The minimum pulse rate was 48.0 bpm while the maximum pulse rate was 78.0 bpm.   Comments: Events were worse in supine sleep REM rebound noted with CPAP   Diagnosis: Severe OSA corrected by CPAP 9 cm  Recommendations: CPAP 9 cm with small F& P fullface mask Follow clinically on these settings with review of download   This study was personally reviewed and electronically signed by: Harden Staff, MD Accredited Board Certified in Sleep Medicine

## 2023-08-28 ENCOUNTER — Telehealth: Payer: Self-pay | Admitting: Primary Care

## 2023-08-28 NOTE — Telephone Encounter (Signed)
 Spoke with patient,relayed results,verbalized understanding.NFN

## 2023-08-28 NOTE — Telephone Encounter (Signed)
 PT called to check status of CPAP machine. It was ordered and he was told to call for appt 30 days after getting machine. NFN

## 2023-08-28 NOTE — Telephone Encounter (Signed)
 Disregard

## 2023-08-28 NOTE — Telephone Encounter (Signed)
 PT ret call on results. Tsf to [employer]

## 2023-08-28 NOTE — Progress Notes (Signed)
 Please let patient know Split night sleep study showed severe OSA, AHI 40 events corrected by CPAP 9 cm with small F& P fullface mask. He is due for new machine. Please place order for CPAP at 9cm h20. Needs FU in 31-90 days for compliance check. May need to push out apt on 9/30.

## 2023-08-28 NOTE — Progress Notes (Signed)
 ATC X1. LMTCB

## 2023-09-11 ENCOUNTER — Other Ambulatory Visit: Payer: Self-pay | Admitting: Internal Medicine

## 2023-09-15 ENCOUNTER — Encounter: Payer: Self-pay | Admitting: Adult Health

## 2023-09-15 ENCOUNTER — Ambulatory Visit (INDEPENDENT_AMBULATORY_CARE_PROVIDER_SITE_OTHER): Admitting: Adult Health

## 2023-09-15 ENCOUNTER — Ambulatory Visit (INDEPENDENT_AMBULATORY_CARE_PROVIDER_SITE_OTHER)

## 2023-09-15 ENCOUNTER — Ambulatory Visit: Payer: Self-pay

## 2023-09-15 ENCOUNTER — Telehealth: Payer: Self-pay

## 2023-09-15 VITALS — BP 118/70 | HR 62 | Temp 98.4°F | Ht 66.0 in | Wt 182.0 lb

## 2023-09-15 DIAGNOSIS — M79645 Pain in left finger(s): Secondary | ICD-10-CM

## 2023-09-15 NOTE — Telephone Encounter (Signed)
 CRM received on 09-15-23 by [employer]  regarding CPAP supplies. This just needed a signature from the provider. Will fax once signed.
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
## 2023-09-15 NOTE — Telephone Encounter (Signed)
 FYI Only or Action Required?: FYI only for provider.  Patient was last seen in primary care on 06/26/2023 by Theophilus Andrews, [name], MD.  Called Nurse Triage reporting Finger Injury.  Symptoms began several weeks ago.  Interventions attempted: Nothing.  Symptoms are: unchanged.  Triage Disposition: See HCP Within 4 Hours (Or PCP Triage)  Patient/caregiver understands and will follow disposition?: Yes     Copied from CRM [id number]. Topic: Clinical - Red Word Triage >> Sep 15, 2023  8:15 AM Robinson H wrote: [facility] that prompted transfer to Nurse Triage: 7-8 days ago hit tip of index finger left hand, swollen and hurts to touch, dark spot in nail bed that's moving up slowly Reason for Disposition  [1] MODERATE-SEVERE pain AND [2] blood present under a nail  Answer Assessment - Initial Assessment Questions 1. MECHANISM: How did the injury happen?      7-8 days ago hit the tip of index finger on left hand and its swollen and still swollen 2. ONSET: When did the injury happen? (e.g., minutes, hours ago)      7-8 days ago 3. LOCATION: What part of the finger is injured? Is the nail damaged?      Left hand  4. APPEARANCE of the INJURY: What does the injury look like?      Swelling, states black spot on nail bed, darker pink 5. SEVERITY: Can you use the hand normally?  Can you bend your fingers into a ball and then fully open them?     Can bend it 6. SIZE: For cuts, bruises, or swelling, ask: How large is it? (e.g., inches or centimeters;  entire finger)      denies 7. PAIN: Is there pain? If Yes, ask: How bad is the pain?  (Scale 0-10; or none, mild, moderate, severe)     Sensitive to touch 8. TETANUS: For any breaks in the skin, ask: When was your last tetanus booster?     denies 9. OTHER SYMPTOMS: Do you have any other symptoms?     denies 10. PREGNANCY: Is there any chance you are pregnant? When was your last menstrual period?        na  Protocols used: Finger Injury-A-AH

## 2023-09-20 ENCOUNTER — Telehealth: Payer: Self-pay

## 2023-09-20 NOTE — Telephone Encounter (Signed)
 Patient notified of update  and verbalized understanding.

## 2023-09-20 NOTE — Telephone Encounter (Signed)
 Pt advised that there is not a way to expedite the results. Pt advised that we will call him as soon as they are available. Pt verbalized understanding. Pt also stated that there is a click he hears now when moving the injured finger. Pt want to know what he should do.

## 2023-09-20 NOTE — Telephone Encounter (Signed)
 Copied from CRM [id number]. Topic: Clinical - Lab/Test Results >> Sep 20, 2023  9:48 AM Manuel Barker wrote: Reason for CRM: Patient is calling to follow up on the results for the x-ray of the left hand done on 09/15/2023 ordered by Darleene Shape. I advised results were not in yet, but patient would like to know if there is anyway to expedite the results.

## 2023-09-22 ENCOUNTER — Ambulatory Visit: Payer: Self-pay | Admitting: Adult Health

## 2023-09-22 ENCOUNTER — Other Ambulatory Visit: Payer: Self-pay | Admitting: Interventional Cardiology

## 2023-09-22 DIAGNOSIS — S62669K Nondisplaced fracture of distal phalanx of unspecified finger, subsequent encounter for fracture with nonunion: Secondary | ICD-10-CM

## 2023-09-22 NOTE — Telephone Encounter (Signed)
 CRM signed and ready to be faxed. NFN

## 2023-10-03 ENCOUNTER — Ambulatory Visit: Admitting: Primary Care

## 2023-10-04 ENCOUNTER — Other Ambulatory Visit: Payer: Self-pay | Admitting: Internal Medicine

## 2023-10-12 ENCOUNTER — Telehealth: Payer: Self-pay | Admitting: Internal Medicine

## 2023-10-12 MED ORDER — CLOPIDOGREL BISULFATE 75 MG PO TABS: ORAL_TABLET | ORAL | 0 refills | Status: DC

## 2023-10-12 NOTE — Telephone Encounter (Signed)
*  STAT* If patient is at the pharmacy, call can be transferred to refill team.   1. Which medications need to be refilled? (please list name of each medication and dose if known)   clopidogrel  (PLAVIX ) 75 MG tablet   2. Which pharmacy/location (including street and city if local pharmacy) is medication to be sent to? [facility] [phone] - [place], [place] - [address] Phone: [phone]  Fax: [phone]      3. Do they need a 30 day or 90 day supply? Pt has made the next avail appt for 12/15/23 please refill til then

## 2023-10-12 NOTE — Telephone Encounter (Signed)
 RX sent in

## 2023-10-13 ENCOUNTER — Other Ambulatory Visit: Payer: Self-pay | Admitting: Interventional Cardiology

## 2023-10-13 ENCOUNTER — Other Ambulatory Visit: Payer: Self-pay | Admitting: Internal Medicine

## 2023-10-17 ENCOUNTER — Ambulatory Visit: Admitting: Physician Assistant

## 2023-10-19 ENCOUNTER — Ambulatory Visit: Admitting: Orthopedic Surgery

## 2023-10-19 ENCOUNTER — Other Ambulatory Visit (INDEPENDENT_AMBULATORY_CARE_PROVIDER_SITE_OTHER): Payer: Self-pay

## 2023-10-19 DIAGNOSIS — M79642 Pain in left hand: Secondary | ICD-10-CM | POA: Diagnosis not present
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
## 2023-11-06 ENCOUNTER — Encounter: Payer: Self-pay | Admitting: Radiology

## 2023-11-14 ENCOUNTER — Other Ambulatory Visit: Payer: Self-pay | Admitting: Internal Medicine

## 2023-11-17 ENCOUNTER — Telehealth: Payer: Self-pay | Admitting: Internal Medicine

## 2023-11-17 NOTE — Telephone Encounter (Signed)
*  STAT* If patient is at the pharmacy, call can be transferred to refill team.   1. Which medications need to be refilled? (please list name of each medication and dose if known)  rosuvastatin  (CRESTOR ) 40 MG tablet   2. Which pharmacy/location (including street and city if local pharmacy) is medication to be sent to? [facility] [phone] - [place],  - [address]    3. Do they need a 30 day or 90 day supply?  90 day supply   Patient says he is completely out of medication.

## 2023-11-17 NOTE — Telephone Encounter (Signed)
 Refill was sent 11/16/23.

## 2023-12-10 ENCOUNTER — Other Ambulatory Visit: Payer: Self-pay | Admitting: Internal Medicine

## 2023-12-10 DIAGNOSIS — I10 Essential (primary) hypertension: Secondary | ICD-10-CM
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
## 2023-12-15 ENCOUNTER — Ambulatory Visit: Attending: Internal Medicine | Admitting: Internal Medicine

## 2023-12-15 ENCOUNTER — Encounter: Payer: Self-pay | Admitting: Internal Medicine

## 2023-12-15 VITALS — BP 106/60 | HR 66 | Resp 14 | Ht 66.0 in | Wt 183.6 lb

## 2023-12-15 DIAGNOSIS — E063 Autoimmune thyroiditis: Secondary | ICD-10-CM | POA: Insufficient documentation

## 2023-12-15 DIAGNOSIS — Z79899 Other long term (current) drug therapy: Secondary | ICD-10-CM

## 2023-12-15 DIAGNOSIS — I10 Essential (primary) hypertension: Secondary | ICD-10-CM

## 2023-12-15 DIAGNOSIS — I25119 Atherosclerotic heart disease of native coronary artery with unspecified angina pectoris: Secondary | ICD-10-CM | POA: Diagnosis not present

## 2023-12-15 NOTE — Patient Instructions (Addendum)
 Medication Instructions:  Decrease Amlodipine  5 mg daily  *If you need a refill on your cardiac medications before your next appointment, please call your pharmacy*  Lab Work: Vitamin D  level, A1C, TSH, CBC, BMET, NMR today  Testing/Procedures: NONE ordered at this time of appointment   Follow-Up: At [facility], you and your health needs are our priority.  As part of our continuing mission to provide you with exceptional heart care, our providers are all part of one team.  This team includes your primary Cardiologist (physician) and Advanced Practice Providers or APPs (Physician Assistants and Nurse Practitioners) who all work together to provide you with the care you need, when you need it.  Your next appointment:   8 month(s)  Provider:   [name], MD    We recommend signing up for the patient portal called MyChart.  Sign up information is provided on this After Visit Summary.  MyChart is used to connect with patients for Virtual Visits (Telemedicine).  Patients are able to view lab/test results, encounter notes, upcoming appointments, etc.  Non-urgent messages can be sent to your provider as well.   To learn more about what you can do with MyChart, go to [email or web address].

## 2023-12-17 LAB — BASIC METABOLIC PANEL WITH GFR
BUN/Creatinine Ratio: 13 (ref 10–24)
BUN: 12 mg/dL (ref 8–27)
CO2: 24 mmol/L (ref 20–29)
Calcium: 9.7 mg/dL (ref 8.6–10.2)
Chloride: 104 mmol/L (ref 96–106)
Creatinine, Ser: 0.94 mg/dL (ref 0.76–1.27)
Glucose: 83 mg/dL (ref 70–99)
Potassium: 4 mmol/L (ref 3.5–5.2)
Sodium: 141 mmol/L (ref 134–144)
eGFR: 86 mL/min/1.73 (ref >59)

## 2023-12-17 LAB — LIPOPROTEIN A (LPA): Lipoprotein (a): 344.5 nmol/L — ABNORMAL HIGH (ref <75.0)

## 2023-12-17 LAB — NMR, LIPOPROFILE
Cholesterol, Total: 110 mg/dL (ref 100–199)
HDL Particle Number: 25.5 umol/L — ABNORMAL LOW (ref >=30.5)
HDL-C: 45 mg/dL (ref >39)
LDL Particle Number: 419 nmol/L (ref <1000)
LDL Size: 20.8 nm (ref >20.5)
LDL-C (NIH Calc): 51 mg/dL (ref 0–99)
LP-IR Score: 42 (ref <=45)
Small LDL Particle Number: 200 nmol/L (ref <=527)
Triglycerides: 64 mg/dL (ref 0–149)

## 2023-12-17 LAB — CBC
Hematocrit: 45 % (ref 37.5–51.0)
Hemoglobin: 14.7 g/dL (ref 13.0–17.7)
MCH: 29.3 pg (ref 26.6–33.0)
MCHC: 32.7 g/dL (ref 31.5–35.7)
MCV: 90 fL (ref 79–97)
Platelets: 185 x10E3/uL (ref 150–450)
RBC: 5.02 x10E6/uL (ref 4.14–5.80)
RDW: 13.6 % (ref 11.6–15.4)
WBC: 5.7 x10E3/uL (ref 3.4–10.8)

## 2023-12-17 LAB — HEMOGLOBIN A1C
Est. average glucose Bld gHb Est-mCnc: 123 mg/dL
Hgb A1c MFr Bld: 5.9 % — ABNORMAL HIGH (ref 4.8–5.6)

## 2023-12-17 LAB — TSH: TSH: 1.04 u[IU]/mL (ref 0.450–4.500)

## 2023-12-17 LAB — VITAMIN D 25 HYDROXY (VIT D DEFICIENCY, FRACTURES): Vit D, 25-Hydroxy: 43.7 ng/mL (ref 30.0–100.0)

## 2023-12-20 ENCOUNTER — Ambulatory Visit: Payer: Self-pay | Admitting: Internal Medicine

## 2023-12-27 NOTE — Progress Notes (Signed)
 Last read by Emil JINNY Agent at 9:08PM on 12/26/2023.

## 2023-12-29 NOTE — Telephone Encounter (Signed)
 PT returning call to nurse

## 2024-01-15 ENCOUNTER — Ambulatory Visit: Admitting: Internal Medicine

## 2024-01-17 ENCOUNTER — Ambulatory Visit

## 2024-01-17 ENCOUNTER — Other Ambulatory Visit: Payer: Self-pay | Admitting: Internal Medicine

## 2024-01-17 VITALS — BP 118/62 | HR 67 | Temp 98.8°F | Ht 66.0 in | Wt 183.8 lb

## 2024-01-17 DIAGNOSIS — Z23 Encounter for immunization: Secondary | ICD-10-CM

## 2024-01-17 DIAGNOSIS — Z0000 Encounter for general adult medical examination without abnormal findings: Secondary | ICD-10-CM | POA: Diagnosis not present

## 2024-01-17 NOTE — Patient Instructions (Addendum)
 Manuel Barker,  Thank you for taking the time for your Medicare Wellness Visit. I appreciate your continued commitment to your health goals. Please review the care plan we discussed, and feel free to reach out if I can assist you further.  Please note that Annual Wellness Visits do not include a physical exam. Some assessments may be limited, especially if the visit was conducted virtually. If needed, we may recommend an in-person follow-up with your provider.  Ongoing Care Seeing your primary care provider every 3 to 6 months helps us  monitor your health and provide consistent, personalized care.   Referrals If a referral was made during today's visit and you haven't received any updates within two weeks, please contact the referred provider directly to check on the status.  Recommended Screenings:  Health Maintenance  Topic Date Due   Hepatitis C Screening  Never done   DTaP/Tdap/Td vaccine (2 - Td or Tdap) 08/11/2022   Flu Shot  08/04/2023   COVID-19 Vaccine (4 - 2025-26 season) 09/04/2023   Medicare Annual Wellness Visit  01/16/2025   Pneumococcal Vaccine for age over 56  Completed   Zoster (Shingles) Vaccine  Completed   Meningitis B Vaccine  Aged Out   Colon Cancer Screening  Discontinued       01/17/2024    1:08 PM  Advanced Directives  Does Patient Have a Medical Advance Directive? No  Would patient like information on creating a medical advance directive? No - Patient declined    Vision: Annual vision screenings are recommended for early detection of glaucoma, cataracts, and diabetic retinopathy. These exams can also reveal signs of chronic conditions such as diabetes and high blood pressure.  Dental: Annual dental screenings help detect early signs of oral cancer, gum disease, and other conditions linked to overall health, including heart disease and diabetes.  Please see the attached documents for additional preventive care recommendations.
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
## 2024-01-18 ENCOUNTER — Encounter: Admitting: Internal Medicine

## 2024-01-18 NOTE — Progress Notes (Signed)
 This encounter was created in error - please disregard.

## 2025-01-22 ENCOUNTER — Ambulatory Visit
